# Patient Record
Sex: Female | Born: 1935 | Race: White | Hispanic: No | Smoking: Never smoker
Health system: Southern US, Community
[De-identification: ages and names within clinical notes are randomized; demographics above are authoritative.]

## PROBLEM LIST (undated history)

## (undated) DIAGNOSIS — E785 Hyperlipidemia, unspecified: Secondary | ICD-10-CM

## (undated) DIAGNOSIS — K219 Gastro-esophageal reflux disease without esophagitis: Secondary | ICD-10-CM

## (undated) DIAGNOSIS — M545 Low back pain, unspecified: Secondary | ICD-10-CM

## (undated) DIAGNOSIS — F419 Anxiety disorder, unspecified: Secondary | ICD-10-CM

## (undated) DIAGNOSIS — G43909 Migraine, unspecified, not intractable, without status migrainosus: Secondary | ICD-10-CM

## (undated) DIAGNOSIS — Z9289 Personal history of other medical treatment: Secondary | ICD-10-CM

## (undated) DIAGNOSIS — F32A Depression, unspecified: Secondary | ICD-10-CM

## (undated) DIAGNOSIS — I10 Essential (primary) hypertension: Secondary | ICD-10-CM

## (undated) DIAGNOSIS — C4490 Unspecified malignant neoplasm of skin, unspecified: Secondary | ICD-10-CM

## (undated) DIAGNOSIS — H3530 Unspecified macular degeneration: Secondary | ICD-10-CM

## (undated) DIAGNOSIS — M1990 Unspecified osteoarthritis, unspecified site: Secondary | ICD-10-CM

## (undated) DIAGNOSIS — D649 Anemia, unspecified: Secondary | ICD-10-CM

## (undated) DIAGNOSIS — E119 Type 2 diabetes mellitus without complications: Secondary | ICD-10-CM

## (undated) DIAGNOSIS — R0602 Shortness of breath: Secondary | ICD-10-CM

## (undated) DIAGNOSIS — R51 Headache: Secondary | ICD-10-CM

## (undated) DIAGNOSIS — R519 Headache, unspecified: Secondary | ICD-10-CM

## (undated) DIAGNOSIS — G8929 Other chronic pain: Secondary | ICD-10-CM

## (undated) DIAGNOSIS — F329 Major depressive disorder, single episode, unspecified: Secondary | ICD-10-CM

## (undated) HISTORY — PX: CYSTECTOMY: SUR359

## (undated) HISTORY — PX: SKIN CANCER EXCISION: SHX779

## (undated) HISTORY — PX: CATARACT EXTRACTION W/ INTRAOCULAR LENS  IMPLANT, BILATERAL: SHX1307

## (undated) HISTORY — DX: Unspecified macular degeneration: H35.30

## (undated) HISTORY — PX: UMBILICAL HERNIA REPAIR: SHX196

## (undated) HISTORY — PX: CHOLECYSTECTOMY: SHX55

## (undated) HISTORY — PX: HERNIA REPAIR: SHX51

## (undated) HISTORY — PX: ABDOMINAL HYSTERECTOMY: SHX81

---
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
## 1999-09-23 ENCOUNTER — Encounter: Payer: Self-pay | Admitting: Family Medicine

## 1999-09-23 ENCOUNTER — Encounter: Admission: RE | Admit: 1999-09-23 | Discharge: 1999-09-23 | Payer: Self-pay | Admitting: Family Medicine

## 2001-09-19 ENCOUNTER — Encounter: Payer: Self-pay | Admitting: Family Medicine

## 2001-09-19 ENCOUNTER — Encounter: Admission: RE | Admit: 2001-09-19 | Discharge: 2001-09-19 | Payer: Self-pay | Admitting: Family Medicine

## 2001-09-20 ENCOUNTER — Encounter (INDEPENDENT_AMBULATORY_CARE_PROVIDER_SITE_OTHER): Payer: Self-pay | Admitting: Specialist

## 2001-09-20 ENCOUNTER — Inpatient Hospital Stay (HOSPITAL_COMMUNITY): Admission: EM | Admit: 2001-09-20 | Discharge: 2001-09-22 | Payer: Self-pay | Admitting: Emergency Medicine

## 2002-01-28 ENCOUNTER — Inpatient Hospital Stay (HOSPITAL_COMMUNITY): Admission: RE | Admit: 2002-01-28 | Discharge: 2002-01-30 | Payer: Self-pay | Admitting: General Surgery

## 2002-03-14 ENCOUNTER — Emergency Department (HOSPITAL_COMMUNITY): Admission: EM | Admit: 2002-03-14 | Discharge: 2002-03-14 | Payer: Self-pay | Admitting: Emergency Medicine

## 2002-04-16 ENCOUNTER — Emergency Department (HOSPITAL_COMMUNITY): Admission: EM | Admit: 2002-04-16 | Discharge: 2002-04-16 | Payer: Self-pay | Admitting: Emergency Medicine

## 2002-04-16 ENCOUNTER — Encounter: Payer: Self-pay | Admitting: Emergency Medicine

## 2002-06-10 ENCOUNTER — Encounter: Payer: Self-pay | Admitting: Gastroenterology

## 2002-06-10 ENCOUNTER — Ambulatory Visit (HOSPITAL_COMMUNITY): Admission: RE | Admit: 2002-06-10 | Discharge: 2002-06-10 | Payer: Self-pay | Admitting: Gastroenterology

## 2003-02-24 ENCOUNTER — Ambulatory Visit (HOSPITAL_COMMUNITY): Admission: RE | Admit: 2003-02-24 | Discharge: 2003-02-24 | Payer: Self-pay | Admitting: Ophthalmology

## 2004-08-26 ENCOUNTER — Ambulatory Visit (HOSPITAL_COMMUNITY): Admission: RE | Admit: 2004-08-26 | Discharge: 2004-08-26 | Payer: Self-pay | Admitting: Ophthalmology

## 2006-05-10 ENCOUNTER — Observation Stay (HOSPITAL_COMMUNITY): Admission: EM | Admit: 2006-05-10 | Discharge: 2006-05-11 | Payer: Self-pay | Admitting: Emergency Medicine

## 2010-02-14 ENCOUNTER — Emergency Department (HOSPITAL_COMMUNITY): Admission: EM | Admit: 2010-02-14 | Discharge: 2010-02-14 | Payer: Self-pay | Admitting: Emergency Medicine

## 2010-08-04 LAB — BASIC METABOLIC PANEL WITH GFR
BUN: 14 mg/dL (ref 6–23)
CO2: 27 meq/L (ref 19–32)
Calcium: 9.4 mg/dL (ref 8.4–10.5)
Chloride: 100 meq/L (ref 96–112)
Creatinine, Ser: 1.02 mg/dL (ref 0.4–1.2)
GFR calc non Af Amer: 52 mL/min — ABNORMAL LOW
Glucose, Bld: 187 mg/dL — ABNORMAL HIGH (ref 70–99)
Potassium: 4 meq/L (ref 3.5–5.1)
Sodium: 136 meq/L (ref 135–145)

## 2010-09-12 ENCOUNTER — Inpatient Hospital Stay (HOSPITAL_COMMUNITY)
Admission: EM | Admit: 2010-09-12 | Discharge: 2010-09-15 | DRG: 389 | Disposition: A | Discharge status: Home / Self Care | Payer: Medicare Other | Attending: Internal Medicine | Admitting: Internal Medicine

## 2010-09-12 ENCOUNTER — Emergency Department (HOSPITAL_COMMUNITY): Payer: Medicare Other

## 2010-09-12 DIAGNOSIS — H3530 Unspecified macular degeneration: Secondary | ICD-10-CM | POA: Diagnosis present

## 2010-09-12 DIAGNOSIS — K565 Intestinal adhesions [bands], unspecified as to partial versus complete obstruction: Principal | ICD-10-CM | POA: Diagnosis present

## 2010-09-12 DIAGNOSIS — K219 Gastro-esophageal reflux disease without esophagitis: Secondary | ICD-10-CM | POA: Diagnosis present

## 2010-09-12 DIAGNOSIS — M1990 Unspecified osteoarthritis, unspecified site: Secondary | ICD-10-CM | POA: Diagnosis present

## 2010-09-12 DIAGNOSIS — N179 Acute kidney failure, unspecified: Secondary | ICD-10-CM | POA: Diagnosis present

## 2010-09-12 DIAGNOSIS — G609 Hereditary and idiopathic neuropathy, unspecified: Secondary | ICD-10-CM | POA: Diagnosis present

## 2010-09-12 DIAGNOSIS — E785 Hyperlipidemia, unspecified: Secondary | ICD-10-CM | POA: Diagnosis present

## 2010-09-12 DIAGNOSIS — Z79899 Other long term (current) drug therapy: Secondary | ICD-10-CM

## 2010-09-12 DIAGNOSIS — E871 Hypo-osmolality and hyponatremia: Secondary | ICD-10-CM | POA: Diagnosis present

## 2010-09-12 DIAGNOSIS — Z8673 Personal history of transient ischemic attack (TIA), and cerebral infarction without residual deficits: Secondary | ICD-10-CM

## 2010-09-12 DIAGNOSIS — E7800 Pure hypercholesterolemia, unspecified: Secondary | ICD-10-CM | POA: Diagnosis present

## 2010-09-12 DIAGNOSIS — F3289 Other specified depressive episodes: Secondary | ICD-10-CM | POA: Diagnosis present

## 2010-09-12 DIAGNOSIS — Z7982 Long term (current) use of aspirin: Secondary | ICD-10-CM

## 2010-09-12 DIAGNOSIS — E119 Type 2 diabetes mellitus without complications: Secondary | ICD-10-CM | POA: Diagnosis present

## 2010-09-12 DIAGNOSIS — F329 Major depressive disorder, single episode, unspecified: Secondary | ICD-10-CM | POA: Diagnosis present

## 2010-09-12 DIAGNOSIS — I10 Essential (primary) hypertension: Secondary | ICD-10-CM | POA: Diagnosis present

## 2010-09-12 LAB — CBC
HCT: 38.2 % (ref 36.0–46.0)
Hemoglobin: 13.6 g/dL (ref 12.0–15.0)
MCH: 30.7 pg (ref 26.0–34.0)
MCV: 86.2 fL (ref 78.0–100.0)
Platelets: 307 10*3/uL (ref 150–400)
RBC: 4.43 MIL/uL (ref 3.87–5.11)
WBC: 10 10*3/uL (ref 4.0–10.5)

## 2010-09-12 LAB — DIFFERENTIAL
Lymphocytes Relative: 17 % (ref 12–46)
Lymphs Abs: 1.7 10*3/uL (ref 0.7–4.0)
Monocytes Relative: 18 % — ABNORMAL HIGH (ref 3–12)
Neutro Abs: 6.5 10*3/uL (ref 1.7–7.7)
Neutrophils Relative %: 65 % (ref 43–77)

## 2010-09-12 LAB — BASIC METABOLIC PANEL
BUN: 45 mg/dL — ABNORMAL HIGH (ref 6–23)
CO2: 24 mEq/L (ref 19–32)
Chloride: 88 mEq/L — ABNORMAL LOW (ref 96–112)
Glucose, Bld: 239 mg/dL — ABNORMAL HIGH (ref 70–99)
Potassium: 4.8 mEq/L (ref 3.5–5.1)

## 2010-09-13 ENCOUNTER — Inpatient Hospital Stay (HOSPITAL_COMMUNITY): Payer: Medicare Other

## 2010-09-13 LAB — CBC
HCT: 32.4 % — ABNORMAL LOW (ref 36.0–46.0)
Hemoglobin: 11.2 g/dL — ABNORMAL LOW (ref 12.0–15.0)
MCH: 29.9 pg (ref 26.0–34.0)
RBC: 3.75 MIL/uL — ABNORMAL LOW (ref 3.87–5.11)

## 2010-09-13 LAB — COMPREHENSIVE METABOLIC PANEL
ALT: 16 U/L (ref 0–35)
AST: 19 U/L (ref 0–37)
CO2: 25 mEq/L (ref 19–32)
Chloride: 102 mEq/L (ref 96–112)
Creatinine, Ser: 1.25 mg/dL — ABNORMAL HIGH (ref 0.4–1.2)
GFR calc Af Amer: 50 mL/min — ABNORMAL LOW (ref >60)
GFR calc non Af Amer: 41 mL/min — ABNORMAL LOW (ref >60)
Glucose, Bld: 151 mg/dL — ABNORMAL HIGH (ref 70–99)
Sodium: 135 mEq/L (ref 135–145)
Total Bilirubin: 0.9 mg/dL (ref 0.3–1.2)

## 2010-09-13 LAB — GLUCOSE, CAPILLARY
Glucose-Capillary: 149 mg/dL — ABNORMAL HIGH (ref 70–99)
Glucose-Capillary: 105 mg/dL — ABNORMAL HIGH (ref 70–99)

## 2010-09-14 ENCOUNTER — Inpatient Hospital Stay (HOSPITAL_COMMUNITY): Payer: Medicare Other

## 2010-09-14 LAB — GLUCOSE, CAPILLARY
Glucose-Capillary: 128 mg/dL — ABNORMAL HIGH (ref 70–99)
Glucose-Capillary: 88 mg/dL (ref 70–99)
Glucose-Capillary: 89 mg/dL (ref 70–99)

## 2010-09-14 LAB — CBC
Platelets: 220 10*3/uL (ref 150–400)
RBC: 3.51 MIL/uL — ABNORMAL LOW (ref 3.87–5.11)
WBC: 6.6 10*3/uL (ref 4.0–10.5)

## 2010-09-14 LAB — HEMOGLOBIN A1C: Hgb A1c MFr Bld: 7 % — ABNORMAL HIGH (ref <5.7)

## 2010-09-14 LAB — BASIC METABOLIC PANEL
Chloride: 109 mEq/L (ref 96–112)
GFR calc Af Amer: >60 mL/min (ref >60)
Potassium: 3.7 mEq/L (ref 3.5–5.1)
Sodium: 143 mEq/L (ref 135–145)

## 2010-09-14 NOTE — H&P (Signed)
NAME:  Tamara Arnold, Tamara Arnold NO.:  [id number]  MEDICAL RECORD NO.:  [id number]           PATIENT TYPE:  E  LOCATION:  [facility]                         FACILITY:  [facility]  PHYSICIAN:  Alphia Moh, MD   DATE OF BIRTH:  02/23/1926  DATE OF ADMISSION:  09/12/2010 DATE OF DISCHARGE:                             HISTORY & PHYSICAL   PRIMARY CARE PHYSICIAN:  [name], MD at [facility] at [place].  CHIEF COMPLAINT:  Vomiting.  HISTORY OF PRESENT ILLNESS:  Tamara Arnold is an 77 year old woman with past medical history significant for his appendicitis as a child with appendectomy, cholecystectomy, hernia repair, and hysterectomy who presents with vomiting times approximately 4 days.  The patient states that on Friday prior to admission, she developed some vomiting.  She describes this is not associated with any other symptoms aside from throwing up any ingested products.  Apparently this became persistently worse over time.  She reports that anything that she tried to eat or drink would come up within a short period of time after doing so. Husband states that she threw up all of last night.  Today she went to visit her primary care physician who got an abdominal x-ray and recommended that the patient be evaluated in the emergency department for a suspected partial small-bowel obstruction.  Upon evaluation in the emergency department, acute abdominal series demonstrates findings consistent with a small-bowel obstruction.  Dr. Christy Gentles, EDP, spoke and discussed the case with Dr. Georganna Skeans (surgery) and recommended a medical admission.  At that point, [facility] were called for the admission.  PAST MEDICAL HISTORY: 1. History of Bell's palsy. 2. Hypertension. 3. Diabetes. 4. GERD. 5. Hyperlipidemia. 6. Lower back pain. 7. Macular degeneration. 8. Osteoarthritis. 9. Peripheral neuropathy. 10.History of TIA.  PAST SURGICAL  HISTORY: 1. Appendectomy. 2. Hernia repair. 3. Hysterectomy. 4. Cholecystectomy.  MEDICATIONS: 1. Omega-3 1000 mg p.o. b.i.d. 2. Multivitamin 1 tablet p.o. daily. 3. Vitamin C. 4. Calcium plus vitamin D. 5. Triamcinolone acetate applied to skin twice daily to affected     areas. 6. Crestor 20 mg half a tablet p.o. daily. 7. Folic acid 1 mg 2 tablets p.o. daily. 8. Perphenazine 4 mg p.o. at bedtime. 9. Nexium 40 mg p.o. daily. 10.Amitriptyline 25 mg p.o. at bedtime. 11.Moexipril 7.5 mg 2 tablets p.o. daily. 12.Propranolol 40 mg p.o. b.i.d. 13.Meloxicam 15 mg p.o. daily. 14.Glimepiride 2 mg p.o. daily. 15.Aspirin 81 mg p.o. daily. 16.Wellbutrin XL 300 mg p.o. daily. 17.Clonazepam 1 mg p.o. b.i.d. p.r.n. 18.Fenofibrate 160 mg p.o. daily.  ALLERGIES:  No known drug allergies.  SOCIAL HISTORY:  The patient is married and lives with her husband. Husband's name is Tenisa Fleetwood.  Home phone number is [phone].  The patient denies any tobacco, alcohol, or illicit drug use.  FAMILY HISTORY:  Noncontributory.  REVIEW OF SYSTEMS:  As per HPI.  All others reviewed and negative.  PHYSICAL EXAMINATION:  VITAL SIGNS:  Temperature 97.0, blood pressure 135/61, heart rate of 91, respirations 18, O2 saturation 97% on room air. GENERAL:  This is an elderly lady lying in bed in no acute distress.  HEENT:  Head is normocephalic, atraumatic.  Pupils equally round and reactive to light.  Extraocular movements are intact.  Sclerae are anicteric.  Mucous membranes are very dry.  There is an NG tube in place with gastric contents being suctioned that are dark yellow in color. There is poor dentition.  No oropharyngeal lesions. NECK:  Supple.  There is no JVD.  No carotid bruits. LUNGS:  Clear to auscultation with good air movement bilaterally. HEART:  Normal S1, S2 with a regular rate and rhythm.  No murmurs, gallops, or rubs. ABDOMEN:  Grossly distended on inspection.  There is a paucity of  bowel sounds though present.  Overall nontender to palpation and tympanic to percussion. EXTREMITIES:  There is no cyanosis, clubbing, or edema. NEURO:  Cranial nerves II-XII are grossly intact.  Motor is intact. Sensation is intact.  The patient is awake, alert, and oriented x3.  LABORATORY DATA:  WBC 10.0, hemoglobin 13.6, platelets 307,000.  Sodium 123, potassium 4.8, chloride 88, bicarb 24, glucose 239, BUN 45, creatinine 1.45, calcium 8.8.  IMAGING:  Acute abdominal series.  IMPRESSION:  Findings consistent with a small-bowel obstruction.  ASSESSMENT/PLAN: 1. Small-bowel obstruction as mentioned in HPI, EDP discussed the case     with Dr. Georganna Skeans from [facility] and agreed     that the patient is not a surgical candidate at this time.  The     patient will be admitted to regular floor for conservative/medical     management.  NG tube is currently placed for symptoms.  We will     keep the patient n.p.o. and monitor over the next few days.  If     there is no improvement with spontaneous resolution, will need a     formal surgical consultation to further evaluate.  Given her     history, this is most likely secondary to adhesions from her     multiple abdominal surgeries.  If the patient does not improve, we     will need to pursue further imaging such as a CT scan to further     evaluate the source of obstruction.  In the meantime, can evaluate     with acute abdominal series to see if small bowel dilation is     improving. 2. Hyponatremia.  Given the patient's history and physical exam     findings, this is most likely secondary to volume depletion.  We     will go ahead and hydrate with normal saline and follow metabolic     panel.  If in the case that this does not resolve appropriately, we     will need to consider checking a TSH plus or minus a cortisol level     though this is most likely secondary to volume depletion. 3. Acute renal failure.  The  patient's creatinine is 1.45 with a BUN     of 45 and an elevated BUN to creatinine ratio.  As per the above     problems, this is likely secondary following depletion.  We will     hold the patient's ACE inhibitor and hydrate with normal saline as     per above.  Monitor metabolic panel once creatinine is down to     normal range, can consider restarting the above medications. 4. Hypertension.  As mentioned, we continue to hold the medications     tonight as the patient is n.p.o. with NG tube intermittent suction     due  to persistent vomiting.  Once this has improved, can restart     medications with temporary clamping of the NG tube. 5. Depression.  The patient is on a few psychiatric medications.  As     mentioned above, we will go ahead and hold these tonight and     consider restarting tomorrow with temporary clamping of the NG     tube. 6. Hyperlipidemia as above.  As above, we will hold medications and     consider starting tomorrow with temporary clamping of the NG tube. 7. Diabetes.  We will hold glimepiride along with all the other     medicines as mentioned.  We will cover with sliding scale insulin     while in-house. 8. The patient is a full code.  Husband, Tabatha Santalucia with home     phone number [phone] is the person to contact in case of any     emergency.     Alphia Moh, MD     MA/MEDQ  D:  09/12/2010  T:  09/12/2010  Job:  [id number]  cc:   [name], M.D. Merri Ray Grandville Silos, M.D. Ripley Fraise, MD  Electronically Signed by Alphia Moh MD on 09/14/2010 02:46:35 PM

## 2010-09-15 LAB — GLUCOSE, CAPILLARY
Glucose-Capillary: 93 mg/dL (ref 70–99)
Glucose-Capillary: 81 mg/dL (ref 70–99)

## 2010-09-19 NOTE — Consult Note (Signed)
Tamara, Arnold               ACCOUNT NO.:  [id number]  MEDICAL RECORD NO.:  [id number]           PATIENT TYPE:  I  LOCATION:  5035                         FACILITY:  [facility]  PHYSICIAN:  Merri Ray. Grandville Silos, M.D.DATE OF BIRTH:  06/25/1925  DATE OF CONSULTATION:  09/12/2010 DATE OF DISCHARGE:                                CONSULTATION   CHIEF COMPLAINT:  Nausea, vomiting.  HISTORY OF PRESENT ILLNESS:  Tamara Arnold is a very pleasant 77 year old white female who complains of nausea and vomiting for the past 4 days. She has not had any significant abdominal pain, only mild at times.  She was seen by her primary physician and directed to the emergency department for further evaluation.  Her last bowel movement was 2 days ago.  PAST MEDICAL HISTORY: 1. Diabetes. 2. Hypercholesterolemia. 3. Hypertension.  She does not have a history of previous bowel     obstruction.  PAST SURGICAL HISTORY: 1. Cholecystectomy. 2. Incisional hernia repair. 3. Hysterectomy. 4. Appendectomy.  SOCIAL HISTORY:  Does not smoke, does not drink alcohol, does not use drugs.  REVIEW OF SYSTEMS:  GI complaints as above.  In addition, CARDIAC: Negative.  PULMONARY:  Negative.  GU:  Negative.  MUSCULOSKELETAL: Negative.  Review of systems is otherwise normal.  MEDICATIONS:  Please see MAR.  ALLERGIES:  PREDNISONE.  PHYSICAL EXAMINATION:  VITAL SIGNS:  Temperature 97.5, heart rate 87, blood pressure 133/76, respirations 16, saturations 96%. HEENT:  Head is normocephalic.  Nose has NG tube in place.  Mouth, oral mucosa is dry. NECK:  Trachea is in the midline.  There are no masses or tenderness. LUNGS:  Respiratory effort is normal.  Lungs are clear to auscultation bilaterally. HEART:  Normal S1 and S2.  There is no peripheral edema.  PMI is palpable in the left chest. ABDOMEN:  Soft, but distended.  She has some high-pitched bowel sounds and some normal bowel sounds.  She has mild right lower  quadrant tenderness.  No masses are felt.  There is no guarding and no generalized peritonitis. MUSCULOSKELETAL:  Well developed without tenderness. SKIN:  Dry and warm. NEUROLOGIC:  No gross deficit.  Speech is fluent and she follows commands.  LABORATORY STUDIES:  Sodium 123, potassium 4.8, chloride 88, CO2 of 24, BUN 45, creatinine 1.45, glucose 239.  White blood cell count 10,000, hemoglobin 13.6.  DIAGNOSTICS:  Abdominal series shows small bowel obstruction, however, air and stool is present in the colon.  There is no free air noted.  IMPRESSION:  Small bowel obstruction, likely secondary to adhesions from previous surgery.  PLAN:  I agree with medical admission and NG tube placement with bowel rest and IV fluids.  We will evaluate the patient clinically tomorrow and possibly proceed with CT scan of the abdomen and pelvis if she does not improve clinically.  I also discussed possibility of needing the surgery for exploration and possible lysis of adhesions if her bowel obstruction does not resolve with the above therapies.  Other questions were answered.     Merri Ray Grandville Silos, M.D.     BET/MEDQ  D:  09/12/2010  T:  09/13/2010  Job:  [id number]  cc:   [name], M.D. Ripley Fraise, MD  Electronically Signed by Georganna Skeans M.D. on 09/19/2010 01:56:36 PM

## 2010-09-22 NOTE — Discharge Summary (Signed)
Tamara Arnold, Tamara Arnold NO.:  [id number]  MEDICAL RECORD NO.:  [id number]           PATIENT TYPE:  I  LOCATION:  [id number]                         FACILITY:  [facility]  PHYSICIAN:  Tamara Binet, MD    DATE OF BIRTH:  Oct 14, 1925  DATE OF ADMISSION:  09/12/2010 DATE OF DISCHARGE:  09/15/2010                              DISCHARGE SUMMARY   PRIMARY CARE PHYSICIAN:  Tamara Craver, MD  DISCHARGE DIAGNOSES: 1. Active diagnosis: partial small-bowel obstruction.  The rest of     the discharge diagnoses are chronic.  The patient has had them for     a prolonged period of time and include: 2. Acute renal failure resolved. 3. Hypertension. 4. Diabetes mellitus. 5. Gastroesophageal reflux disease. 6. Hyperlipidemia. 7. Lower back pain. 8. Macular degeneration. 9. Osteoarthritis. 10.Peripheral neuropathy. 11.History of transient ischemic attack. 12.Chronic headaches. 13.Emotional illness.  DISCHARGE MEDICATIONS: 1. Zofran 4 mg q. 6 hours as needed for nausea.  The patient will     receive a prescription for 20 tablets with no refills. 2. Aspirin 81 mg 1 tablet daily. 3. Clonazepam 1 mg 1 tablet twice daily as needed for stress. 4. Crestor half tablet every Monday, Wednesday, Friday by mouth. 5. Folic acid 1 mg 2 tablets by mouth daily. 6. Glimepiride 2 mg 1 tablet by mouth daily. 7. Meloxicam 15 mg 1 tablet by mouth daily. 8. Moexipril 7.5 mg 1 tablet by mouth daily. 9. Multivitamin OTC. 10.Nexium 40 mg 1 tablet by mouth daily. 11.Omega-3-acid ethyl esters 1 gram by mouth twice daily. 12.Perphenazine 4 mg 1 tablet daily at bedtime. 13.Propranolol 40 mg 1 tablet by mouth twice daily. 14.TriCor 150 mg 1 tablet by mouth daily. 15.Vitamin C 1000 mg by mouth daily. 16.Wellbutrin 1 tablet by mouth daily.  The patient will stop taking the following medications. 1. Amitriptyline 25 mg 1 tablet by mouth daily. 2. Calcium carbonate 600 mg 1 tablet by mouth daily.  HISTORY  OF PRESENT ILLNESS AND BRIEF HOSPITAL COURSE:  Tamara Arnold is an 77 year old female with past medical history significant for appendicitis, appendectomy, cholecystectomy, hernia repair and hysterectomy who presented in the emergency department with vomiting for 4 days.  She did not have any hematemesis.  She did have a previous history of bowel obstruction.  X-ray in the emergency department revealed suspected partial small-bowel obstruction and constipation. The patient was admitted, an NG tube was placed, Surgery consult was called.  Surgery saw the patient on September 12, 2010.  They agreed with admission, NG tube placement, bowel rest and IV fluids.  They recommended a CT scan if the patient did not improve quickly clinically and considered the necessity of surgery for exploration and possible lysis of adhesions, however, fortunately with bowel rest and NG suction on September 13, 2010, she was already feeling somewhat improved and her abdominal x-ray appeared improved.  On September 14, 2010, the patient began taking sips of clear liquids without any nausea or vomiting.  Today, September 15, 2010, the patient appears well.  She is sitting up, moving around, taking clear liquids without any difficulty and desires discharge to home.  Next, diagnosis with regards to her mild acute renal failure on admission the patient had been vomiting for 4 days and was noted to have a BUN of 45, creatinine 1.45.  Her renal failure could have been simply the result of dehydration.  Her BUN and creatinine improved nicely and on September 14, 2010, her creatinine was 0.94, BUN 24.  CONSULTATIONS:  [facility] consulted on this patient on September 12, 2010.  She was seen by Tamara Arnold.  IMAGING:  On admission September 12, 2010, the patient had two-view abdominal x-ray that showed findings consistent with small bowel obstruction.  On September 14, 2010, the patient's follow-up abdominal x-ray showed a  nonobstructive bowel gas pattern and decreased stool in her colon.  PERTINENT LABORATORY DATA:  On admission, the patient had a CBC within normal limits.  Her white count was 10.0, hemoglobin 13.6, hematocrit 38.2, platelets 307.  Sodium 123, potassium 4.8, chloride 88, bicarb 24, glucose 239, BUN 45, creatinine 1.45.  Her hemoglobin A1c this admission was found to be 7.0 on September 14, 2010.  Also on September 14, 2010, her CBC was as follows: White count 6.6, hemoglobin 10.4, we believe the drop to be delusional from IV fluids, hematocrit 30.7.  Sodium is now 143, potassium 3.7, chloride 109, bicarb 24, glucose 84, BUN 24, creatinine 0.94.  PHYSICAL EXAMINATION ON DISCHARGE:  GENERAL:  This morning, the patient is alert and oriented, up standing and bathing herself. VITAL SIGNS:  Temperature 99.6, pulse 104, respirations 20, blood pressure 140/82, oxygen sat is 97% on room air. HEENT:  Head is atraumatic, normocephalic.  Eyes are slightly hyperemic but pupils are equal, round and reactive to light.  Nose shows no nasal discharge or exterior lesions.  Mouth has moist mucous membranes and moderate dentition. NECK:  Supple with midline trachea.  No JVD.  No lymphadenopathy. CHEST:  Demonstrates no accessory muscle use, shows no wheezes or crackles to my auscultation. HEART:  Slightly tachycardic without murmurs, rubs or gallops. ABDOMEN:  Slightly distended with bowel sounds and is nontender to palpation. EXTREMITIES:  No clubbing, cyanosis or edema. SKIN:  No rashes, bruises or lesions. NEURO:  Cranial nerves II-XII are grossly intact.  She has no facial asymmetries, no obvious focal neuro deficits. PSYCHIATRIC:  The patient is alert and oriented, demeanor pleasant, cooperative.  Grooming is excellent.  DISCHARGE INSTRUCTIONS:  The patient is to increase her activity slowly. She will be discharged with a rolling walker for stability.  Diet:  She will start with clear liquids and slowly  advance if there is no nausea or vomiting or pain to full - thicker liquids, then eventually to soft solid.  The patient has been educated that if nausea or vomiting develops prior to seeing Tamara Arnold, she is to either call Tamara Arnold or return to the emergency department.  She has also been instructed that if she has no bowel movement for 2 days, she is to take two Colace pills twice a day until she has a bowel movement to avoid constipation.  FOLLOWUP APPOINTMENTS:  The patient is to see Dr. Juanita Arnold within the next 2 weeks.  THINGS TO FOLLOW UP ON AS AN OUTPATIENT:  After IV fluids, the patient's hemoglobin dropped to approximately 10.  Consequently, she is anemic and likely appropriate for outpatient workup if this has not already been undertaken.     Melton Alar, PA   ______________________________ Tamara Binet, MD    MLY/MEDQ  D:  09/15/2010  T:  09/15/2010  Job:  [id number]  cc:   Tamara Arnold, M.D.  Electronically Signed by Imogene Burn PA on 09/20/2010 12:52:47 PM Electronically Signed by Tamara Arnold  on 09/22/2010 03:26:56 PM

## 2010-10-07 NOTE — Op Note (Signed)
[facility]  Patient:    Tamara Arnold, Tamara Arnold Visit Number: [id number] MRN: [id number]          Service Type: SUR Location: 3W [id number] Attending Physician:  Tiffany Kocher Dictated by:   Darene Lamer Hoxworth, M.D. Proc. Date: 09/20/01 Admit Date:  09/20/2001                             Operative Report  PREOPERATIVE DIAGNOSES:  Cholelithiasis and cholecystitis.  POSTOPERATIVE DIAGNOSES:  Cholelithiasis and cholecystitis.  SURGICAL PROCEDURE:  Laparoscopic cholecystectomy.  SURGEON:  Darene Lamer. Hoxworth, M.D.  ASSISTANT:  Shellia Carwin, M.D.  ANESTHESIA:  General.  BRIEF HISTORY:  Ms.  Arnold is a 77 year old white female who presents with four days of persistent and worsening epigastric abdominal pain associated with nausea. She has had a gallbladder ultrasound yesterday that showed a gallbladder packed with gallstones and normal common bile duct. She presented to the emergency room today with increasing pain and epigastric tenderness noted on exam. Laparoscopic cholecystectomy has been recommended and accepted. The nature of the procedure, its indications, risks of bleeding, infection, bile leak, and bile duct injury were discussed and understood preoperatively. She is now brought to the operating room for this procedure.  DESCRIPTION OF PROCEDURE:  The patient was brought to the operating room and placed in supine position on the operating table and general endotracheal anesthesia was induced. PAS were in place. She was given preoperative antibiotics. The entire abdomen was sterilely prepped and draped. The patient had a previous low midline incision and a large ventral incisional hernia in the lower right quadrant. I elected to obtain a pneumoperitoneum with a Veress needle in the right upper quadrant and this was obtained and using an Optiview 5 mm trocar and scope, the abdomen was entered under direct vision through a 5 mm trocar  in the right upper quadrant. Under direct vision, another 5 mm trocar was placed in the right upper quadrant and a 10/12 mm trocar just to the right of the falciform ligament in the epigastrium. Some midline adhesions were then sharply taken down from the midline. These were omental adhesions only. This allowed placement of another 10/12 mm trocar in the mid abdomen. The patients umbilicus had been surgically removed previously. The gallbladder was visualized. It was literally packed with gallstones. It did not appear, however, acutely inflamed. The fundus was grasped and elevated up over the liver and the infundibulum retracted inferolaterally. Fibrofatty tissue was stripped from the neck of the gallbladder toward the porta hepatis. The distal gallbladder and Calots triangle were thoroughly dissected. The cystic artery was identified coursing up onto the gallbladder wall. The cystic duct was identified and dissected free over about 2 cm and the cystic duct gallbladder junction dissected 360 degrees. When the anatomy was clear, the cystic duct and cystic artery were doubly clipped proximally, clipped distally an divided. The gallbladder was then dissected free from its bed using hook and cautery. Complete hemostasis was assured after detaching the gallbladder. The right upper quadrant was irrigated and suctioned until clear. The gallbladder was placed in an endocatch bag and then was brought up to the epigastric port site and multiple gallstones removed and the gallbladder removed. The operative site was again inspected for hemostasis and trocars removed and CO2 evacuated from the peritoneal cavity. The fascial defect in the epigastrium which had been stretched slightly removing the gallbladder was closed with a figure-of-eight suture of #  0 Vicryl. The skin incisions were closed with interrupted subcuticular 4-0 monocryl and Steri-Strips. Sponge, needle and instrument counts were correct.  Dry sterile dressings were applied and the patient taken to the recovery room in good condition. Dictated by:   Darene Lamer. Hoxworth, M.D. Attending Physician:  Tiffany Kocher DD:  09/20/01 TD:  09/22/01 Job: [id number]

## 2010-10-07 NOTE — Op Note (Signed)
NAME:  Tamara Arnold, Tamara Arnold NO.:  [id number]   MEDICAL RECORD NO.:  [id number]          PATIENT TYPE:  OIB   LOCATION:                               FACILITY:  [facility]   PHYSICIAN:  Garey Ham, M.D.DATE OF BIRTH:  [birth year]   DATE OF PROCEDURE:  08/26/2004  DATE OF DISCHARGE:                                 OPERATIVE REPORT   PREOPERATIVE DIAGNOSIS:  Senile nuclear cataract, left eye.   POSTOPERATIVE DIAGNOSIS:  Senile nuclear cataract, left eye.   NAME OF OPERATION:  Planned extracapsular cataract extraction --  phacoemulsification, primary insertion of posterior chamber intraocular lens  implant.   SURGEON:  Garey Ham, M.D.   ASSISTANT:  Nurse.   ANESTHESIA:  Local 4% Xylocaine, 0.75 Marcaine, retrobulbar block with  Wydase added, topical tetracaine, intraocular Xylocaine.  Anesthesia standby  required in this patient. Patient given sodium Pentothal intravenously  during the period of retrobulbar blocking.   OPERATIVE PROCEDURE:  After the patient was prepped and draped, a lid  speculum was inserted in the left eye. The eye was turned downward and a  superior rectus suture placed after a speculum had been inserted.  A  peritomy was performed adjacent to the limbus from the 11 to 1 o'clock  position. The corneoscleral junction was cleaned and a corneoscleral groove  made a 45 degree Superblade. The anterior chamber was then entered with the  2.5 mm diamond keratome at the 12 o'clock position; and the 15-degree blade  at the 2:30 position.   Using a bent 26-gauge needle on an Occucoat syringe a circular  capsulorrhexis was begun and then completed with the Utrata forceps.  Hydrodissection and hydrodelineation were performed using 1% Xylocaine. The  30-degree phacoemulsification tip was then inserted with slow controlled  emulsification of the lens nucleus.  Total ultrasonic time 1 minute 31  seconds, average power level 19%, total amount of fluid  used 90 mL  Following removal of the nucleus. The residual cortex was aspirated with the  irrigation-aspiration tip. The posterior capsule appeared intact with a  brilliant red fundus reflex.  It was, therefore, elected to insert an  Allergan medical optics, SI 40 MB silicone, 3-piece, posterior chamber  intraocular lens implant, diopter strength +20.00.   This was inserted with the McDonald forceps into the anterior chamber and  then centered in through the capsular bag using the [facility] lens rotator. The  lens appeared to be well-centered. The Occucoat and Provisc, which had been  used intermittently during the procedures, were aspirated and replaced with  balanced salt solution and Miochol ophthalmic solution. The operative  incisions appeared to be self-sealing. It was elected, however, to place a  single 10-0 interrupted nylon suture across the 12 o'clock incision to  ensure closure and to prevent endophthalmitis.  Maxitrol ointment was instilled in the conjunctival cul-de-sac and a light  patch and protective shield applied. Duration of procedure and anesthesia  administration 45 minutes. The patient tolerated the procedure well in  general, left the operating room for the recovery room in good condition.  ___________________________________________  Garey Ham, M.D.    HNJ/MEDQ  D:  08/26/2004  T:  08/26/2004  Job:  [id number]

## 2010-10-07 NOTE — H&P (Signed)
NAMEMarland Arnold  CHIZUKO, CHINEN NO.:  [id number]   MEDICAL RECORD NO.:  [id number]          PATIENT TYPE:  OIB   LOCATION:  2899                         FACILITY:  [facility]   PHYSICIAN:  Garey Ham, M.D.DATE OF BIRTH:  November 12, 1925   DATE OF ADMISSION:  08/26/2004  DATE OF DISCHARGE:                                HISTORY & PHYSICAL   CHIEF COMPLAINT:  This was a planned outpatient readmission of this 77-year-  old white female admitted for cataract implant surgery of the left eye.   PRESENT ILLNESS:  This patient has been noted to have progressive cataract  formation in both eyes. She was previously admitted on October 5, [id number], for  uncomplicated cataract implant surgery of the right eye. The patient did  well following this surgery with return of vision to 20/20. The left  unoperated has recently deteriorated to 20/60 -2 and the patient has elected  to proceed with similar cataract implant surgery. She was given oral  discussion and printed information concerning the procedure and its possible  complications. She signed an informed consent and arrangements were made for  her outpatient admission at this time.   PAST MEDICAL HISTORY:  The patient is in stable general health under the  care of Tamara Arnold, M.D. The patient has non insulin-dependent diabetes  mellitus with good control of her blood sugar.   CURRENT MEDICATIONS:  Include Tri-Chlor, Amaryl, Univasc, Wellbutrin,  propranolol, and Clonidine. She is felt to be in satisfactory condition for  the proposed surgery by Dr. Tollie Pizza.   REVIEW OF SYSTEMS:  No cardiorespiratory complaints.   PHYSICAL EXAMINATION:  VITAL SIGNS: As recorded on admission blood pressure  151/78, pulse 81, respirations 16, temperature 96.8.  GENERAL APPEARANCE: The patient is a pleasant well-nourished, well-developed  77 year old white female in no acute distress.  HEENT: EYES: Visual acuity last recorded at 20/40 right eye, 20/70 -2  left  eye. External ocular and slit lamp examination of the eyes are white and  clear with a nuclear cataract noted in the left eye and a clear posterior  chamber implant in the right eye. Dilated fundus examination of both eyes  reveals scattered multiple macular retinal drusen.  CHEST/LUNGS: Clear to percussion and auscultation.  HEART: Normal sinus rhythm, no cardiomegaly. No murmurs.  ABDOMEN: Negative.  EXTREMITIES: Negative.   ADMISSION DIAGNOSIS:  Senile cataract left eye, pseudophakia right eye.   SURGICAL PLAN:  Cataract implant surgery left eye.      HNJ/MEDQ  D:  08/26/2004  T:  08/26/2004  Job:  [id number]

## 2010-10-07 NOTE — H&P (Signed)
NAME:  Tamara Arnold, Tamara Arnold NO.:  [id number]   MEDICAL RECORD NO.:  [id number]          PATIENT TYPE:  EMS   LOCATION:  MAJO                         FACILITY:  [facility]   PHYSICIAN:  Jacquelynn Cree, M.D.   DATE OF BIRTH:  Sep 03, 1925   DATE OF ADMISSION:  05/09/2006  DATE OF DISCHARGE:                              HISTORY & PHYSICAL   PRIMARY CARE PHYSICIAN:  [name], M.D. of [facility].   CHIEF COMPLAINT:  Heart palpitations.   HISTORY OF PRESENT ILLNESS:  The patient is a 77 year old female who,  while watching television tonight, experienced the sudden onset of a  vague feeling of discomfort followed by chest palpitations.  The patient  denies any history of similar events.  The patient states that she has  been treated for headaches and hypertension with propranolol, and her  physician recently increased her propranolol dose to 40 mg t.i.d.  When  she went to get a refill on this medication, the pharmacy has run out,  and she, therefore, did not have any propranolol for the past 24 hours.  The patient called EMS and upon initial arrival was found to be in  supraventricular tachycardia with rates in the 160s.  She received  adenosine in the field, and this brought her heart rate down into the  low 100s.  Upon arrival in the emergency department, she was given an IV  dose of metoprolol, and her heart rate has steadily come down and is now  currently running in the 90s.  She was also found to be fairly  hypertensive upon arrival in the ER, and is, therefore, being admitted  for further evaluation and observation.  The patient denies any chest  pain or shortness of breath.   PAST MEDICAL HISTORY:  1. Diabetes.  2. Hypertension.  3. Hypercholesterolemia.  4. Chronic headaches.  5. Osteoarthritis.   PAST SURGICAL HISTORY:  1. Hysterectomy.  2. Status post hernia repair.  3. Status post removal of a benign skin growth on her scalp with  skin      grafting.  4. Cataract surgery bilaterally.  5. Status post laparoscopic cholecystectomy.  6. Status post appendectomy.  7. Status post colonoscopy and upper endoscopy in Januaryof 2004      for workup of iron deficiency anemia.   FAMILY HISTORY:  The patient's mother died at 60, presumably from heart  disease.  Father died in his [id number] from complications of diabetes.  The  patient has two brothers who are deceased; one died of skin cancer at  age 75.  He also had heart disease.  The other brother died suddenly at  age 4 from an acute MI.   SOCIAL HISTORY:  The patient is married.  She is retired from office  work.  She is a lifelong nonsmoker.  She does not drink alcohol.  She  has 4 children good health.   ALLERGIES:  STEROIDS.   CURRENT MEDICATIONS:  1. Propranolol 40 mg t.i.d.  2. Amaryl 1 mg daily.  3. Crestor 5 mg daily.  4. Klonopin 1 mg b.i.d.  5. Multivitamin daily.  6. Meloxicam 15 mg daily.  7. Perphenazine/amitriptyline 2/25 one tablet b.i.d.  8. Tricor 200 mg daily.  9. Univasc 7.5 mg 1/2 tablet daily.  10.Wellbutrin XL 300 mg daily.   REVIEW OF SYSTEMS:  The patient denies any fever.  Her appetite has been  normal.  She denies any cough.  She has osteoarthritic type pain in her  hips and legs, nothing new.  No changes in her bowel habits.  No melena  or hematochezia.  No nausea or vomiting.  No dysuria.  She does note  some abdominal bloating over the past 24 hours and some mild pedal  edema.   PHYSICAL EXAMINATION:  VITAL SIGNS:  Temperature 97.8, pulse 96,  respirations 20, blood pressure 186/76, O2 saturation 100% on room air.  GENERAL:  This is a well-developed, well-nourished female who is in no  acute distress.  HEENT:  Normocephalic, atraumatic.  Pupils are equal, round and reactive  to light.  Extraocular movements intact.  Oropharynx is clear.  She has  dentures to the upper palate.  NECK:  Supple, no thyromegaly, no lymphadenopathy, no  jugular venous  distension.  CHEST:  Lungs clear to auscultation bilaterally with good air movement.  No rales.  HEART:  Regular rate and rhythm.  No murmurs, rubs or gallops.  ABDOMEN:  Soft, nontender, nondistended with normoactive bowel sounds.  EXTREMITIES:  There is trace pedal edema with 2+ pulses bilaterally.  SKIN:  The patient does have some alopecia to the scalp.  Otherwise,  appeared normal.  No rashes.  NEUROLOGIC:  The patient is alert and oriented x3.  Cranial nerves II-  XII grossly intact.  Moves all extremities x4 with equal strength.   DATA REVIEWED:  Chest x-ray shows no active disease.  It is stable when  compared to prior chest x-rays.   A 12-lead EKG shows normal sinus rhythm with a rate of 88 beats per  minute.  There are no ST or T-wave changes appreciated.   LABORATORY DATA:  White blood cell count of 6.4, hemoglobin 12.3,  hematocrit 35.6, platelets 274.  Sodium is 132, potassium 3.7, chloride  98, bicarbonate 25, BUN 12, creatinine 0.9, glucose 131.  LFTs are  within normal limits.  Point of care cardiac markers in the emergency  department show normal CK and CK-MB fractions.  The troponin has  gradually risen x3 sets with the first one being less than 0.05, 0.06,  and 0.09.   ASSESSMENT/PLAN:  1. Self-limited supraventricular tachycardia likely secondary to      abrupt withdrawal of propranolol.  Will admit the patient for 23-      hour observation to a telemetry bed and monitor her closely for      signs of recurrence.  We will resume her home dose of propranolol.      Given the stress of this event, we will cycle cardiac enzymes q.8h.      x3.  The mild increase in her troponin is likely reflective of the      tachycardia.  The patient is completely without symptoms at this      time.  Given her comorbidities, however, we will monitor closely on      telemetry. 2. Hypertension.  Again, the patient's hypertension is likely      secondary to abrupt  withdrawal to propranolol over the past 24      hours.  We will resume this medications and monitor her blood  pressure for return to a goal of less than 140/85.  3. Diabetes.  The patient likely has excellent control.  Nevertheless,      we will check a hemoglobin A1c to ensure that she is adequately      controlled.  We will resume her Amaryl and check her sugars before      meals and at bedtime.  We will cover with sliding scale insulin if      indicated.  4. Hypercholesterolemia.  Will check a fasting lipid panel in the      morning and continue her usual antihyperlipidemic medications.  5. Headaches.  Will resume the patient's home doses of her medications      for prophylaxis.  6. Osteoarthritis.  Continue the patient's meloxicam and supplement      this with Tylenol or oxycodone if needed for breakthrough pain.  7. Mild hyponatremia.  Will check a BMET in the morning.  8. Prophylaxis.  Will initiate Lovenox for deep vein thrombosis      prophylaxis and Protonix for gastrointestinal prophylaxis.      Jacquelynn Cree, M.D.  Electronically Signed     CR/MEDQ  D:  05/10/2006  T:  05/10/2006  Job:  [id number]   cc:   [name], M.D.

## 2010-10-07 NOTE — Op Note (Signed)
NAME:  Tamara Arnold, Tamara Arnold                         ACCOUNT NO.:  [id number]   MEDICAL RECORD NO.:  [id number]                   PATIENT TYPE:  AMB   LOCATION:  ENDO                                 FACILITY:  [facility]   PHYSICIAN:  Nelwyn Salisbury, M.D.               DATE OF BIRTH:  05/09/1926   DATE OF PROCEDURE:  06/10/2002  DATE OF DISCHARGE:                                 OPERATIVE REPORT   PROCEDURE PERFORMED:  Colonoscopy up to the hepatic flexure.   ENDOSCOPIST:  [name], M.D.   INSTRUMENT USED:  Olympus video colonoscope (adjustable pediatric scope)/   INDICATIONS FOR PROCEDURE:  Iron deficiency anemia in a 77 year old white  female with an unrevealing EGD.  Rule out colonic polyps, masses, etc.   PREPROCEDURE PREPARATION:  Informed consent was procured from the patient.  The patient was fasted for eight hours prior to the procedure and prepped  with a bottle of MiraLax and Gatorade the night prior to the procedure.   PREPROCEDURE PHYSICAL:  The patient had stable vital signs.  Neck supple.  Chest clear to auscultation.  S1 and S2 regular.  Abdomen soft with mesh  present in the right lower quadrant from previous hernia surgery.  No  hepatosplenomegaly, no masses palpable.   DESCRIPTION OF PROCEDURE:  The patient was placed in left lateral decubitus  position and sedated with an additional 40 mg of Demerol.  Once the patient  was adequately sedated and maintained on low flow oxygen and continuous  cardiac monitoring, the pediatric Olympus video colonoscope was advanced  from the rectum to the hepatic flexure with difficulty.  There was  significant tortuosity of the colon.  The patient's position was changed  from the left lateral to the supine and the right lateral position to reach  the cecal base but the scope could not be advanced beyond the hepatic  flexure probably secondary to adhesions from previous surgery.  No masses,  polyps, erosions, ulcerations or  diverticula were seen.  Abdominal pressure  was applied to facilitate advancement of the scope but we could not reach  the cecum.  The procedure was aborted at this point with plans for a barium  enema to complete the evaluation.    IMPRESSION:  1. Normal exam up to the hepatic flexure.  2. __________ BE to complete evaluation.  This will be done today to rule     out cecal and right colon lesions.                                                    Nelwyn Salisbury, M.D.    JNM/MEDQ  D:  06/10/2002  T:  06/10/2002  Job:  [id number]   cc:   [name], M.D.  [address]  [place]   [id number]  Fax: [phone]

## 2010-10-07 NOTE — Op Note (Signed)
   NAME:  Tamara Arnold, Tamara Arnold                         ACCOUNT NO.:  [id number]   MEDICAL RECORD NO.:  [id number]                   PATIENT TYPE:  AMB   LOCATION:  ENDO                                 FACILITY:  [facility]   PHYSICIAN:  Nelwyn Salisbury, M.D.               DATE OF BIRTH:  [birth year]   DATE OF PROCEDURE:  06/10/2002  DATE OF DISCHARGE:                                 OPERATIVE REPORT   PROCEDURE PERFORMED:  Esophagogastroduodenoscopy.   ENDOSCOPIST:  Nelwyn Salisbury, M.D.   INSTRUMENT USED:  Olympus video panendoscope.   INDICATIONS FOR PROCEDURE:  A 77 year old white female with a history of  iron deficiency anemia.  Rule out peptic ulcer disease, esophagitis, AVM,  etc.   PREPROCEDURE PREPARATION:  Informed consent was procured from the patient.  The patient had fasted for eight hours prior to the procedure.   PREPROCEDURE PHYSICAL:  VITAL SIGNS:  The patient has stable vital signs.  NECK:  Supple.  CHEST:  Clear to auscultation, S1, S2.  ABDOMEN:  Soft with normal bowel sounds.   DESCRIPTION OF PROCEDURE:  The patient was placed in the left lateral  decubitus position and sedated with 40 mg of Demerol and 4 mg of Versed  intravenously.  Once the patient was adequately sedated and maintained on  low-flow oxygen and continuous cardiac monitoring, the Olympus video  panendoscope was advanced through the mouthpiece, over the tongue, into the  esophagus under direct vision.  The entire esophagus appeared normal with no  evidence of ring, stricture, masses, esophagitis, or Barrett's mucosa.  The  scope was then advanced into the stomach.  The mucosa in the proximal small  bowel appeared normal and without lesions.   IMPRESSION:  Normal esophagogastroduodenoscopy.    RECOMMENDATIONS:  1. Proceed with a colonoscopy at this time.  2. Further recommendations to be made after the colonoscopy.                                               Nelwyn Salisbury, M.D.    JNM/MEDQ  D:  06/10/2002  T:  06/10/2002  Job:  [id number]   cc:   [name], M.D.  [address]  [place]  [place] [address]  Fax: [phone]

## 2010-10-07 NOTE — Op Note (Signed)
NAME:  Tamara Arnold, Tamara Arnold                         ACCOUNT NO.:  [id number]   MEDICAL RECORD NO.:  [id number]                   PATIENT TYPE:  OIB   LOCATION:  NA                                   FACILITY:  [facility]   PHYSICIAN:  Garey Ham, M.D.             DATE OF BIRTH:  December 23, 1925   DATE OF PROCEDURE:  02/24/2003  DATE OF DISCHARGE:                                 OPERATIVE REPORT   PREOPERATIVE DIAGNOSIS:  Senile cataract, right eye.   POSTOPERATIVE DIAGNOSIS:  Senile cataract, right eye.   OPERATION:  Planned extracapsular cataract extraction, primary insertion of  posterior chamber intraocular lens implant, right eye.   SURGEON:  Garey Ham, M.D.   ASSISTANT:  Nurse.   ANESTHESIA:  Local 4% Xylocaine, 0.75 Marcaine, retrobulbar block, topical  tetracaine, intraocular Xylocaine.  Anesthesia standby required in this  elderly patient.  Patient given sodium Pentothal intravenously during the  period of retrobulbar blocking.   DESCRIPTION OF PROCEDURE:  After the patient was prepped and draped, a lid  speculum was inserted in the right eye.  The eye was turned downward and a  superior rectus traction suture placed.  Schiotz tonometry was recorded at 6  to 7 scale units with a 5.5 g weight.  A peritomy was performed adjacent to  the limbus from the 11 to 1 o'clock position.  The corneoscleral junction  was cleaned and a corneoscleral groove made with a 45 degree Superblade.  The anterior chamber was then entered with the 2.5 mm diamond keratome at  the 12 o'clock position and the 15 degree blade at the 2:30 position.  Using  a bent 26-gauge needle on a Occucoat syringe, a circular capsulorhexis was  begun and then completed with the Grabow forceps.  Hydrodissection and  hydrodelineation were performed using 1% Xylocaine.  The 30-degree  phakoemulsification tip was then inserted with slow, controlled  emulsification with lens nucleus with back cracking with the  Bechert pick.  Total ultrasonic time: 56 seconds.  Average power level: 17%.  Total amount  of fluid used: 50 ml.  Following removal of the nucleus, the residual cortex  was aspirated with the irrigation aspiration tip.  The posterior capsule  appeared intact with a brilliant red fundus reflex.  It was, therefore,  elected to insert an Aller[employer] [id number] silicone three-piece  posterior chamber intraocular lens implant, diopter strength +20.00.  This  was inserted with the McDonald forceps into the anterior chamber and then  centered into the capsular bag using the [facility] rotator.  The lens  appeared to be well centered.  The Healon and Occucoat, which had been used  during the procedure, was aspirated and replaced with balanced salt solution  and Miochol ophthalmic solution.  The operative incisions appeared to be  self sealing, and no sutures were required.  Maxitrol ointment was instilled  in the conjunctival cul-de-sac and a  light patch and protector shield  applied.  Duration of procedure and anesthesia administration: 45 minutes.  The patient tolerated the procedure well in general and left the operating  room for the recovery room in good condition.                                                Garey Ham, M.D.   HNJ/MEDQ  D:  02/24/2003  T:  02/24/2003  Job:  [id number]

## 2010-10-07 NOTE — Op Note (Signed)
Tamara Arnold, Tamara Arnold                        ACCOUNT NO.:  [id number]   MEDICAL RECORD NO.:  [id number]                   PATIENT TYPE:  INP   LOCATION:  X003                                 FACILITY:  [facility]   PHYSICIAN:  Marland Kitchen T. Hoxworth, M.D.          DATE OF BIRTH:  Mar 23, 1926   DATE OF PROCEDURE:  01/28/2002  DATE OF DISCHARGE:                                 OPERATIVE REPORT   PREOPERATIVE DIAGNOSIS:  Ventral incisional hernia.   PREOPERATIVE DIAGNOSIS:  Ventral incisional hernia.   OPERATIVE PROCEDURE:  Laparoscopic repair of ventral incisional hernia.   SURGEON:  Darene Lamer. Hoxworth, M.D.   ANESTHESIA:  General.   INDICATIONS:  The patient is a 77 year old white female with a longstanding,  gradually enlarging lower abdominal incisional hernia with a previous  history of a hysterectomy through a low midline incision. Examination  reveals a large hernia mass partially reducible in the lower abdomen.  Laparoscopic repair has been recommended and accepted. The nature of the  procedure, indications and risks including the risk of bleeding, infection,  intestinal injury and possible need for open surgery and risk of recurrent  hernia were all discussed and understood. She is now brought to the  operating room for this procedure.   DESCRIPTION OF PROCEDURE:  The patient was brought to the operating room and  placed in a supine position on the operating table and general endotracheal  anesthesia was induced. The abdomen was widely sterilely prepped and draped.  A Foley catheter was placed. She was given preoperative antibiotics. Local  anesthesia was used to infiltrate a 1 cm area in the left flank and  dissection carried down to the fascia which was incised for 1 cm. The  internal oblique was bluntly split and the transverse peritoneum entered  under direct vision. A mattress suture of #0 Vicryl was placed and the  Hasson trocar inserted and pneumoperitoneum  established. There were some  omental adhesions to the midline incision around the umbilicus and just  below this a fairly discrete hernia defect. This contained small bowel.  Under direct vision a 5 mm trocar was placed in the left lower quadrant.  Using the atraumatic graspers, a large amount of small bowel was reduced  from the hernia without any significant adhesions of bowel to the hernia  sac. Eventually there were just a few adhesions of the mesentery to the edge  of the hernia sac which were taken down with the harmonic scalpel. The  omental adhesions to the upper midline and entire lower abdomen and anterior  abdominal wall were completely taken down with the harmonic scalpel. The  hernia defect was very discrete just to the right of midline about halfway  between the umbilicus and the pubis and measured about 5 cm in diameter. A  12 cm diameter piece of Sofraderm laparoscopic mesh was used. Four #0  Prolene sutures were placed in four quadrants and the mesh  moistened and  rolled in place in the abdominal cavity and that unfurled with the gel side  down. Using the endoclose through four small stab wounds the sutures were  retrieved and brought up to the anterior abdominal wall and the mesh brought  up with a nice broad coverage of the defect with at least 4-5 cm overlapping  in all directions. The sutures were secured. The mesh was then tacked in its  periphery with the endotacker. The bed was irrigated and complete hemostasis  assured. The trocars were removed under direct vision and all were seen to  evacuate the peritoneal cavity. The mattress suture was secured in the left  lower quadrant  incision. The skin incisions were closed with interrupted subcuticular 4-0  Monocryl and Steri-Strips. The sponge, needle and instrument counts were  correct. Gauze dressing was applied and the patient was taken to the  recovery room in good condition.                                                 Darene Lamer. Hoxworth, M.D.    [place]to [place]  D:  01/28/2002  T:  01/28/2002  Job:  [id number]

## 2010-10-07 NOTE — H&P (Signed)
NAME:  Tamara Arnold, Tamara Arnold                         ACCOUNT NO.:  [id number]   MEDICAL RECORD NO.:  [id number]                   PATIENT TYPE:  OIB   LOCATION:  NA                                   FACILITY:  [facility]   PHYSICIAN:  Garey Ham, M.D.             DATE OF BIRTH:  1925/12/10   DATE OF ADMISSION:  02/24/2003  DATE OF DISCHARGE:                                HISTORY & PHYSICAL   This is a planned outpatient surgical admission of this 77 year old white  female admitted for cataract implant surgery of the right eye.   HISTORY OF PRESENT ILLNESS:  This patient has been followed in my office  since October 2002.  At that time, she was referred with mild age-related  macular degeneration, dry type.  Her vision at that time was recorded at  20/70 right eye, 20/30 left eye with correction and 20/30 right eye, 20/25  left eye with correction.  Slit lamp examination revealed the presence of  brunescent nuclear cataract formation with lens vacuoles in both eyes.  Detailed fundus examination showed very mild age-related macular  degeneration with some retinal pigment epithelial defects but no hemorrhage,  exudation, or choroidal neovascular membrane formation.  Over the ensuing  years, the patient has noted a slight deterioration in vision in both eyes.  When she returned to the office on February 04, 2003, vision was recorded  at 20/60- right eye, 20/30 left eye.  It was felt that the nuclear cataracts  had become quite dense and that surgery was indicated in the right eye now,  left eye later.  The patient was given oral discussion and printed  information concerning the procedure and its possible complications.  She  signed an informed consent, and arrangements were made for her outpatient  admission.  There was no change in her age-related macular dry degeneration.   PAST MEDICAL HISTORY:  The patient is in stable health under the care of her  regular physician, Dr. Tollie Pizza.   She is felt to be in satisfactory condition  for the proposed surgery.  Her general health is satisfactory.   MEDICATIONS:  She continues on TriCor, Amaryl, Univasc, Wellbutrin, and  propranolol for non-insulin-dependent diabetes and hypertension.   ALLERGIES:  She has no known drug allergies.   REVIEW OF SYSTEMS:  No cardiorespiratory complaints.   PHYSICAL EXAMINATION:  VITAL SIGNS:  As recorded on admission, blood  pressure 132/62, temperature 97.2, pulse 80, respirations 18.  GENERAL:  The patient is a pleasant, well-nourished, well-developed white  female in no acute distress.  HEENT:  Eyes: Ocular exam as noted above.  CHEST:  Lungs clear to percussion and auscultation.  HEART:  Normal sinus rhythm.  No cardiomegaly, no murmurs.  ABDOMEN:  Negative.  EXTREMITIES:  Negative.    ADMISSION DIAGNOSES:  1. Senile cataract, nuclear type, right eye greater than left eye.  2. Age-related macular degeneration, mild, dry  type.   SURGICAL PLAN:  Cataract implant surgery, right eye now, left eye later as  needed.                                                Garey Ham, M.D.    HNJ/MEDQ  D:  02/24/2003  T:  02/24/2003  Job:  [id number]

## 2010-11-02 ENCOUNTER — Inpatient Hospital Stay (HOSPITAL_COMMUNITY)
Admission: EM | Admit: 2010-11-02 | Discharge: 2010-11-08 | DRG: 103 | Disposition: A | Discharge status: Home Health | Payer: Medicare Other | Attending: Internal Medicine | Admitting: Internal Medicine

## 2010-11-02 ENCOUNTER — Emergency Department (HOSPITAL_COMMUNITY): Payer: Medicare Other

## 2010-11-02 DIAGNOSIS — K219 Gastro-esophageal reflux disease without esophagitis: Secondary | ICD-10-CM | POA: Diagnosis present

## 2010-11-02 DIAGNOSIS — M1990 Unspecified osteoarthritis, unspecified site: Secondary | ICD-10-CM | POA: Diagnosis present

## 2010-11-02 DIAGNOSIS — E871 Hypo-osmolality and hyponatremia: Secondary | ICD-10-CM | POA: Diagnosis present

## 2010-11-02 DIAGNOSIS — E119 Type 2 diabetes mellitus without complications: Secondary | ICD-10-CM | POA: Diagnosis present

## 2010-11-02 DIAGNOSIS — I10 Essential (primary) hypertension: Secondary | ICD-10-CM | POA: Diagnosis present

## 2010-11-02 DIAGNOSIS — E7800 Pure hypercholesterolemia, unspecified: Secondary | ICD-10-CM | POA: Diagnosis present

## 2010-11-02 DIAGNOSIS — G609 Hereditary and idiopathic neuropathy, unspecified: Secondary | ICD-10-CM | POA: Diagnosis present

## 2010-11-02 DIAGNOSIS — M545 Low back pain, unspecified: Secondary | ICD-10-CM | POA: Diagnosis present

## 2010-11-02 DIAGNOSIS — H3530 Unspecified macular degeneration: Secondary | ICD-10-CM | POA: Diagnosis present

## 2010-11-02 DIAGNOSIS — E785 Hyperlipidemia, unspecified: Secondary | ICD-10-CM | POA: Diagnosis present

## 2010-11-02 DIAGNOSIS — R51 Headache: Principal | ICD-10-CM | POA: Diagnosis present

## 2010-11-02 DIAGNOSIS — Z7982 Long term (current) use of aspirin: Secondary | ICD-10-CM

## 2010-11-02 DIAGNOSIS — Z8673 Personal history of transient ischemic attack (TIA), and cerebral infarction without residual deficits: Secondary | ICD-10-CM

## 2010-11-02 LAB — BASIC METABOLIC PANEL
BUN: 12 mg/dL (ref 6–23)
CO2: 27 mEq/L (ref 19–32)
Calcium: 9.6 mg/dL (ref 8.4–10.5)
Glucose, Bld: 131 mg/dL — ABNORMAL HIGH (ref 70–99)
Potassium: 4.3 mEq/L (ref 3.5–5.1)
Sodium: 129 mEq/L — ABNORMAL LOW (ref 135–145)

## 2010-11-02 LAB — CBC
HCT: 28.8 % — ABNORMAL LOW (ref 36.0–46.0)
Hemoglobin: 10.4 g/dL — ABNORMAL LOW (ref 12.0–15.0)
MCH: 30.5 pg (ref 26.0–34.0)
RBC: 3.41 MIL/uL — ABNORMAL LOW (ref 3.87–5.11)

## 2010-11-02 LAB — CK TOTAL AND CKMB (NOT AT ARMC)
CK, MB: 2.4 ng/mL (ref 0.3–4.0)
Relative Index: INVALID (ref 0.0–2.5)

## 2010-11-03 LAB — HEMOGLOBIN A1C
Hgb A1c MFr Bld: 6.4 % — ABNORMAL HIGH (ref <5.7)
Mean Plasma Glucose: 137 mg/dL — ABNORMAL HIGH (ref <117)

## 2010-11-03 LAB — GLUCOSE, CAPILLARY
Glucose-Capillary: 219 mg/dL — ABNORMAL HIGH (ref 70–99)
Glucose-Capillary: 169 mg/dL — ABNORMAL HIGH (ref 70–99)

## 2010-11-03 LAB — MRSA PCR SCREENING: MRSA by PCR: NEGATIVE

## 2010-11-04 LAB — GLUCOSE, CAPILLARY
Glucose-Capillary: 126 mg/dL — ABNORMAL HIGH (ref 70–99)
Glucose-Capillary: 134 mg/dL — ABNORMAL HIGH (ref 70–99)

## 2010-11-05 LAB — BASIC METABOLIC PANEL
GFR calc Af Amer: 59 mL/min — ABNORMAL LOW (ref >60)
GFR calc non Af Amer: 49 mL/min — ABNORMAL LOW (ref >60)
Potassium: 4 mEq/L (ref 3.5–5.1)
Sodium: 134 mEq/L — ABNORMAL LOW (ref 135–145)

## 2010-11-05 LAB — GLUCOSE, CAPILLARY
Glucose-Capillary: 151 mg/dL — ABNORMAL HIGH (ref 70–99)
Glucose-Capillary: 167 mg/dL — ABNORMAL HIGH (ref 70–99)
Glucose-Capillary: 165 mg/dL — ABNORMAL HIGH (ref 70–99)

## 2010-11-06 LAB — CBC
Hemoglobin: 12 g/dL (ref 12.0–15.0)
MCH: 30.5 pg (ref 26.0–34.0)
MCHC: 35.1 g/dL (ref 30.0–36.0)

## 2010-11-06 LAB — GLUCOSE, CAPILLARY
Glucose-Capillary: 196 mg/dL — ABNORMAL HIGH (ref 70–99)
Glucose-Capillary: 142 mg/dL — ABNORMAL HIGH (ref 70–99)
Glucose-Capillary: 149 mg/dL — ABNORMAL HIGH (ref 70–99)

## 2010-11-07 LAB — BASIC METABOLIC PANEL
BUN: 26 mg/dL — ABNORMAL HIGH (ref 6–23)
CO2: 31 mEq/L (ref 19–32)
Calcium: 10.5 mg/dL (ref 8.4–10.5)
Chloride: 97 mEq/L (ref 96–112)
Creatinine, Ser: 0.97 mg/dL (ref 0.50–1.10)
GFR calc Af Amer: >60 mL/min (ref >60)
GFR calc non Af Amer: 55 mL/min — ABNORMAL LOW (ref >60)
Glucose, Bld: 85 mg/dL (ref 70–99)
Potassium: 3.6 mEq/L (ref 3.5–5.1)
Sodium: 134 mEq/L — ABNORMAL LOW (ref 135–145)

## 2010-11-07 LAB — GLUCOSE, CAPILLARY
Glucose-Capillary: 73 mg/dL (ref 70–99)
Glucose-Capillary: 144 mg/dL — ABNORMAL HIGH (ref 70–99)
Glucose-Capillary: 55 mg/dL — ABNORMAL LOW (ref 70–99)
Glucose-Capillary: 67 mg/dL — ABNORMAL LOW (ref 70–99)
Glucose-Capillary: 83 mg/dL (ref 70–99)
Glucose-Capillary: 203 mg/dL — ABNORMAL HIGH (ref 70–99)

## 2010-11-08 LAB — GLUCOSE, CAPILLARY: Glucose-Capillary: 171 mg/dL — ABNORMAL HIGH (ref 70–99)

## 2010-11-08 NOTE — Discharge Summary (Signed)
NAMEMarland Kitchen  Tamara Arnold, Tamara Arnold NO.:  [id number]  MEDICAL RECORD NO.:  [id number]  LOCATION:  3305                         FACILITY:  [facility]  PHYSICIAN:  Cherene Altes, M.D.DATE OF BIRTH:  11-15-1925  DATE OF ADMISSION:  11/02/2010 DATE OF DISCHARGE:  11/08/2010                              DISCHARGE SUMMARY   PRIMARY CARE PHYSICIAN:  [name], MD  DISCHARGE DIAGNOSES: 1. Malignant hypertension.     a.     Quite difficult to control during hospital stay.     b.     Vacillated with episode of abrupt hypotension in face of      treatment.     c.     Stabilized at time of discharge. 2. Severe intractable headache.     a.     History of chronic headache.     b.     Exact etiology unclear.     c.     No acute findings on CT scan of the head.     d.     Symptoms resolved at discharge. 3. Hyponatremia - resolved with hydration. 4. Diabetes mellitus - no acute changes in treatment required. 5. Hypercholesterolemia - on medical therapy. 6. Gastroesophageal reflux disease. 7. Mild chronic low back pain. 8. Osteoarthritis. 9. Macular degeneration. 10.Status post appendectomy. 11.Status post hernia repair. 12.Status post hysterectomy. 13.Status post cholecystectomy.  DISCHARGE MEDICATIONS:  A complete list of the patient's discharge medications available as per the discharge med manager portion of the patient's [place] computer file.  The only significant changes made during her hospital stay was the include the increase of her Inderal to t.i.d. dosing and the addition of Norvasc 5 mg daily.  FOLLOWUP:  The patient is advised to follow up with Dr. Tollie Pizza, within 7-10 days for reevaluation of her blood pressure.  CONSULTATIONS:  None.  PROCEDURES:  CT scan of the head on November 03, 2010 - mild atrophy.  No evidence of acute intracranial abnormality.  Mild chronic sphenoid sinusitis.  HOSPITAL COURSE:  Tamara Arnold is a very pleasant 77 year old female  with a past medical history as noted above.  She presented to hospital on the day of her admission with a 2-day history of severe vertex headache.  This was associated with some nausea, but no significant vomiting.  In the emergency room, she was found to have a systolic blood pressure of 123456 with a diastolic of 71.  She is also found to be hyponatremia with a serum sodium of 129.  The patient was placed in the acute unit.  A CT scan of the head was accomplished and this revealed no acute findings.  The patient was placed in the Step- Down Unit to allow close blood pressure monitoring and treatment. Intravenous medications were added.  On the second day of her hospital stay, the patient was transitioned over to p.o. medications.  She abruptly dropped her blood pressure.  As a result, she required an additional night in the Step-Down Unit.  With discontinuation of the multiple oral agents and determining back to fever agents the patient's blood pressure improved significantly.  As a result, she was felt to be stable to  return to a non step-down floor.  She was transferred. Unfortunately, the patient's hypotension then rebounded back into action and the patient began experiencing systolics in excess of 99991111 again.  As a result, she was transitioned back to the Step-Down Unit.  She remained in the Step-Down Unit for the remainder of her hospital stay to avoid further inconvenience to the patient and diminished risk of sundowning in this elderly female.  With resumption of her oral medications and very slight up titration, the patient's blood pressure became much more improved.  Her headache resolved completely.  With simple volume repletion, her hyponatremia resolved.  Her other chronic medical problems remained stable.  She was evaluated by Physical Therapy and Occupational Therapy who felt that she was safe for discharge home to independent living situation.  Recommendation was made for home  PT and OT and we are therefore made these arrangements.  At the present time, our goal would be discharged the patient on November 08, 2010, assuming her blood pressure remained stable overnight.  If the patient's hospital stay requires further prolongation, an addition will be dictated.     Cherene Altes, M.D.     JTM/MEDQ  D:  11/07/2010  T:  11/07/2010  Job:  [id number]  cc:   [name], M.D.  Electronically Signed by Joette Catching M.D. on 11/08/2010 12:14:19 PM

## 2010-11-09 LAB — GLUCOSE, CAPILLARY

## 2010-11-10 ENCOUNTER — Inpatient Hospital Stay (HOSPITAL_COMMUNITY)
Admission: EM | Admit: 2010-11-10 | Discharge: 2010-11-12 | DRG: 305 | Disposition: A | Discharge status: Home / Self Care | Payer: Medicare Other | Attending: Internal Medicine | Admitting: Internal Medicine

## 2010-11-10 DIAGNOSIS — M1990 Unspecified osteoarthritis, unspecified site: Secondary | ICD-10-CM | POA: Diagnosis present

## 2010-11-10 DIAGNOSIS — E119 Type 2 diabetes mellitus without complications: Secondary | ICD-10-CM | POA: Diagnosis present

## 2010-11-10 DIAGNOSIS — I10 Essential (primary) hypertension: Principal | ICD-10-CM | POA: Diagnosis present

## 2010-11-10 DIAGNOSIS — E785 Hyperlipidemia, unspecified: Secondary | ICD-10-CM | POA: Diagnosis present

## 2010-11-10 DIAGNOSIS — K219 Gastro-esophageal reflux disease without esophagitis: Secondary | ICD-10-CM | POA: Diagnosis present

## 2010-11-10 DIAGNOSIS — H3530 Unspecified macular degeneration: Secondary | ICD-10-CM | POA: Diagnosis present

## 2010-11-10 DIAGNOSIS — Z8673 Personal history of transient ischemic attack (TIA), and cerebral infarction without residual deficits: Secondary | ICD-10-CM

## 2010-11-10 DIAGNOSIS — D649 Anemia, unspecified: Secondary | ICD-10-CM | POA: Diagnosis present

## 2010-11-10 DIAGNOSIS — R51 Headache: Secondary | ICD-10-CM | POA: Diagnosis present

## 2010-11-10 DIAGNOSIS — Z7982 Long term (current) use of aspirin: Secondary | ICD-10-CM

## 2010-11-11 ENCOUNTER — Emergency Department (HOSPITAL_COMMUNITY): Payer: Medicare Other

## 2010-11-11 LAB — BASIC METABOLIC PANEL
CO2: 25 mEq/L (ref 19–32)
Chloride: 105 mEq/L (ref 96–112)
Glucose, Bld: 130 mg/dL — ABNORMAL HIGH (ref 70–99)
Sodium: 138 mEq/L (ref 135–145)

## 2010-11-11 LAB — GLUCOSE, CAPILLARY
Glucose-Capillary: 127 mg/dL — ABNORMAL HIGH (ref 70–99)
Glucose-Capillary: 189 mg/dL — ABNORMAL HIGH (ref 70–99)

## 2010-11-11 LAB — COMPREHENSIVE METABOLIC PANEL
Alkaline Phosphatase: 40 U/L (ref 39–117)
BUN: 13 mg/dL (ref 6–23)
CO2: 28 mEq/L (ref 19–32)
Chloride: 99 mEq/L (ref 96–112)
GFR calc Af Amer: >60 mL/min (ref >60)
GFR calc non Af Amer: >60 mL/min (ref >60)
Glucose, Bld: 210 mg/dL — ABNORMAL HIGH (ref 70–99)
Potassium: 3.8 mEq/L (ref 3.5–5.1)
Total Bilirubin: 0.2 mg/dL — ABNORMAL LOW (ref 0.3–1.2)

## 2010-11-11 LAB — IRON AND TIBC
Iron: 54 ug/dL (ref 42–135)
Saturation Ratios: 19 % — ABNORMAL LOW (ref 20–55)
TIBC: 284 ug/dL (ref 250–470)
UIBC: 230 ug/dL

## 2010-11-11 LAB — DIFFERENTIAL
Basophils Relative: 0 % (ref 0–1)
Eosinophils Absolute: 0.1 10*3/uL (ref 0.0–0.7)
Lymphs Abs: 1.7 10*3/uL (ref 0.7–4.0)
Neutro Abs: 2.2 10*3/uL (ref 1.7–7.7)
Neutrophils Relative %: 45 % (ref 43–77)

## 2010-11-11 LAB — CBC
HCT: 30 % — ABNORMAL LOW (ref 36.0–46.0)
Hemoglobin: 10.3 g/dL — ABNORMAL LOW (ref 12.0–15.0)
Hemoglobin: 9.5 g/dL — ABNORMAL LOW (ref 12.0–15.0)
MCV: 86.1 fL (ref 78.0–100.0)
Platelets: 228 10*3/uL (ref 150–400)
RBC: 3.17 MIL/uL — ABNORMAL LOW (ref 3.87–5.11)
WBC: 4.9 10*3/uL (ref 4.0–10.5)
WBC: 5.6 10*3/uL (ref 4.0–10.5)

## 2010-11-11 LAB — MAGNESIUM: Magnesium: 1.6 mg/dL (ref 1.5–2.5)

## 2010-11-11 LAB — ABO/RH: ABO/RH(D): A POS

## 2010-11-11 LAB — FERRITIN: Ferritin: 181 ng/mL (ref 10–291)

## 2010-11-13 LAB — CROSSMATCH: Unit division: 0

## 2010-11-14 NOTE — H&P (Signed)
NAMEMarland Kitchen  Arnold, ZOOK NO.:  [id number]  MEDICAL RECORD NO.:  [id number]  LOCATION:  [facility]                         FACILITY:  [facility]  PHYSICIAN:  Rise Patience, MDDATE OF BIRTH:  [birth year]  DATE OF ADMISSION:  11/10/2010 DATE OF DISCHARGE:                             HISTORY & PHYSICAL   PRIMARY CARE PHYSICIAN:  Tamara Craver, MD.  CHIEF COMPLAINT:  Headache.  HISTORY OF PRESENT ILLNESS:  An 77 year old female, who just admitted a week ago with high blood pressure and headache, was discharged on June 19, three days ago.  The patient states she was feeling better at discharge, but from yesterday again the headache recurred and her blood pressure was running high and she came to the ER.  The headache is mostly in the frontal part of the head.  Has no associated visual symptoms, nausea, vomiting, or dizziness.  She did not lose consciousness.  Had no difficulty speaking or swallowing.  In the ER,the patient was initially found to have blood pressure in the 99991111 systolic.  The patient has had a CT of the head again, at this time, which shows nothing acute.  After the patient was given 2 mg of IV morphine, her blood pressure has improved and also her headaches has improved.  At this time as the patient has been having recurrent headache, the patient is admitted for observation.  The patient denies any chest pain, shortness of breath.  Denies any dizziness, loss of consciousness.  Denies any focal deficit.  Denies any nausea, vomiting, abdominal pain, dysuria, discharge, diarrhea.  PAST MEDICAL HISTORY:  Chronic headache, almost for 30 years, but for the last 2 weeks, it has been worsened; history of hypertension; history of diabetes mellitus type 2; hyperlipidemia; history of Bell's palsy; GERD; hyperlipidemia; low-back pain; history of TIA; history of peripheral neuropathy; osteoarthritis; and macular degeneration.  PAST SURGICAL HISTORY:  Status post  appendectomy, hernia repair, hysterectomy, and cholecystectomy.  MEDICATIONS PRIOR TO ADMISSION:  The patient states she takes the same medication when she was discharged 3 days ago which includes; 1. Amlodipine 5 mg p.o. daily. 2. Propranolol 40 mg p.o. t.i.d. 3. Vicodin. 4. Amitriptyline 25 mg p.o. at bedtime. 5. Aspirin 81 mg p.o. daily. 6. Calcium carbonate. 7. Vitamin D. 8. Clonazepam 1 mg p.o. twice daily as needed. 9. Colace. 10.Crestor 10 mg p.o. daily. 11.Fish oil 1200 mg 1 capsule by mouth twice daily. 12.Folic acid 2 mg by mouth twice daily. 13.Glimepiride 2 mg by mouth daily. 14.Meloxicam 15 mg p.o. daily. 15.Moexipril 7.5 mg p.o. daily. 16.Multivitamin 1 tablet p.o. daily. 17.Nexium 40 mg p.o. daily. 18.Ocuvite 1 tablet p.o. daily. 19.Perphenazine 4 mg by mouth daily at bedtime. 20.TriCor 145 mg p.o. daily. 21.Vitamin C. 22.Vitamin E. 23.Wellbutrin XL 300 mg p.o. daily.  ALLERGIES:  PREDNISONE.  SOCIAL HISTORY:  The patient denies smoking cigarettes, drinking alcohol, or use illegal drugs.  She is a full code.  Lives with her husband.  FAMILY HISTORY:  Nothing contributory.  REVIEW OF SYSTEMS:  As per in the history of presenting illness.  In addition, the patient has some numbness on the left foot, it has been there almost a week  now.  The patient also has history of peripheral neuropathy.  PHYSICAL EXAMINATION:  GENERAL:  The patient examined at bedside, not in acute distress. VITAL SIGNS:  Blood pressure 160/60, pulse 76 per minute, temperature 97.9, respirations 18 per minute, O2 sat 98%. HEENT:  Anicteric.  No pallor.  No facial asymmetry.  Tongue is midline. No neck rigidity.  PERLA positive, anicteric, no pallor. CHEST:  Bilateral air entry present.  No rhonchi.  No crepitation. HEART:  S1 and S2 heard. ABDOMEN:  Soft, nontender.  Bowel sounds heard. CNS:  The patient is alert, awake, and oriented to time, place, and person.  Moves upper and  lower extremities, 5/5. EXTREMITIES:  Peripheral pulses felt.  No edema.  LABS:  CT of the head without contrast shows no acute intracranial abnormalities.  CBC:  WBC is 4.9, hemoglobin is 9.5, this is a drop from 12 on June 17, hematocrit is 27.3, platelets 228.  Basic metabolic panel:  Sodium [id number], potassium 3, chloride 105, carbon dioxide 25, glucose 130, BUN 15, creatinine 0.6, calcium 8.1.  ASSESSMENT: 1. Intractable headache. 2. Accelerated hypertension. 3. Anemia, normocytic/normochromic. 4. Diabetes mellitus type 2. 5. History of hyperlipidemia. 6. History of gastroesophageal reflux disease. 7. History of low back pain.  PLAN: 1. At this time, admit the patient to telemetry. 2. For headache, which is actually chronic, but has been worsened in     the last 2 weeks, which improved in the last admission, but has     come back again.  The patient states that when her blood pressure     shoots up, her headache worsened.  At this time, her headache has     improved after she was given morphine.  At this time, the blood     pressure also decreased.  At this time, I am going to check a sed     rate.  We are going to continue morphine at least couple more dose.     We will continue Vicodin if the sed rate is high or the headache     does not improve as the patient has just come back again within a     week's time to get a Neurology consult. 3. Accelerated hypertension.  We will continue present medications.  I     see that the patient does have hyperkalemia and she is not on any     diuretics, I am going to check her plasma aldosterone and renin     level to check a ratio of plasma aldosterone and renin ratio to     make sure that the patient does not have any primary     hyperaldosteronism causing her blood pressure uncontrolled.  We     will continue present antihypertensive and keep her on p.r.n.     hydralazine. 4. Anemia.  On the last admission just a week ago, her hemoglobin  is     12, and rechecked done this time is 9.5, which is significant drop     at least 3 grams.  The patient denies having had any nausea,     vomiting, or blood in the stools or black stools.  The patient is     on aspirin and meloxicam, I am going to hold those two now, I am     going to repeat another CBC within a couple of hours.  I am also     going to check her anemia panel.  We should closely follow her  CBC     to make sure there is no further drop in her hemoglobin and follow     her stool for occult blood. 5. Further recommendation based on test order and clinical course.     Rise Patience, MD     ANK/MEDQ  D:  11/11/2010  T:  11/11/2010  Job:  [id number]  cc:   Tamara Arnold, M.D.  Electronically Signed by Gean Birchwood MD on 11/14/2010 07:38:09 AM

## 2010-11-18 LAB — ALDOSTERONE + RENIN ACTIVITY W/ RATIO
ALDO / PRA Ratio: 1.5 Ratio (ref 0.9–28.9)
Aldosterone: 1 ng/dL
PRA LC/MS/MS: 0.68 ng/mL/h (ref 0.25–5.82)

## 2010-11-18 NOTE — H&P (Signed)
NAME:  Tamara Arnold, Tamara Arnold NO.:  [id number]  MEDICAL RECORD NO.:  [id number]  LOCATION:  [facility]                         FACILITY:  [facility]  PHYSICIAN:  Orvan Falconer, MD           DATE OF BIRTH:  Jan 06, 1926  DATE OF ADMISSION:  11/02/2010 DATE OF DISCHARGE:                             HISTORY & PHYSICAL   PRIMARY CARE PHYSICIAN:  [name], MD  ADVANCE DIRECTIVE:  Full code.  REASON FOR ADMISSION:  Headache and severe hypertension.  HISTORY OF PRESENT ILLNESS:  This is an 77 year old female with history of severe hypertension, migraine headache, chronic headache, diabetes, hypercholesterolemia, presents to the emergency room with 2-day history of vertex headache, but no associated neurological symptoms.  She does have some nausea.  She denied omitting any of her medications.  Usually, she has headache with nausea with a migraine but not as severe as this. She was previously admitted for headache that was felt to be chronic. Evaluation in the emergency room shows systolic blood pressure [id number], serum sodium 129, creatinine 0.82.  A head CT show chronic sphenoid sinusitis and some atrophy.  She has a normal white count of 5.1, and hemoglobin of 10.4.  She was given two dosages of labetalol and hydralazine, and her systolic blood pressure is still 193 and with her continue headache, Hospitalist was asked to admit the patient for hypertensive urgency.  She denied any purulent nasal discharge.  PAST MEDICAL HISTORY:  Hypertension, diabetes, hypercholesterolemia, history of Bell palsy, GERD, hyperlipidemia, low back pain, history of TIA, peripheral neuropathy, osteoarthritis, and macular degeneration.  PAST SURGICAL HISTORY:  Status post appendectomy, hernia repair, hysterectomy, and cholecystectomy.  MEDICATIONS: 1. Omega-3. 2. Multivitamin. 3. Crestor 20 mg per day. 4. Folic acid. 5. Perphenazine 4 mg nightly. 6. Nexium 40 mg per day. 7. Amitriptyline 25 mg  nightly. 8. Moexipril 7.5 mg 2 tablets p.o. daily. 9. Propranolol 40 mg t.i.d. 10.Mobic 50 mg per day. 11.Glimepiride 2 mg daily. 12.Aspirin. 13.Wellbutrin 300 mg per day. 14.Clonazepam 1 mg b.i.d. p.r.n. 15.Fenofibrate 160 mg per day.  ALLERGIES:  No known drug allergies.  SOCIAL HISTORY:  She lives with her husband, Weltha Horak, phone number of [phone].  She does not use tobacco, alcohol, or illicit drug use.  FAMILY HISTORY:  Noncontributory.  REVIEW OF SYSTEMS:  Otherwise unremarkable.  PHYSICAL EXAMINATION:  VITAL SIGNS:  Blood pressure 195/75, pulse of 83, respiratory rate 18, and temperature 98.6. GENERAL:  She is alert and oriented, in no apparent distress. HEENT:  Pupils equal, round and reactive.  Sclerae are nonicteric. Throat is clear.  Tongue is midline.  Uvula elevated with phonation. NECK:  Supple.  No stridor. CARDIAC:  S1 and S2, regular.  I did not hear any murmur, rub, or gallop. LUNGS:  Clear. EXTREMITIES:  Leg strength equal bilaterally.  No calf tenderness.  Good distal pulses bilaterally. SKIN:  Warm and dry. NEUROLOGIC:  Nonfocal. PSYCHIATRIC:  Unremarkable as well.  OBJECTIVE FINDINGS:  Head CT show atrophy and sphenoid sinusitis that appear chronic, troponin less than 0.3.  Serum sodium 129, potassium 4.3, creatinine 0.82, blood glucose 131.  White count of 5.1, hemoglobin of 10.4,  and platelet count 227,000.  IMPRESSION:  This is an 77 year old female with history of hypertension, chronic migraine headache, diabetes, hypercholesterolemia, presenting with hypertensive urgency.  I suspect headache is migraine as in status migrainous.  I am not sure if the "chronic sphenoid sinusitis" has anything to do with her "chronic headache".  Since she does not have any nasal discharge, fever or elevated white count, I will elect to hold off on antibiotics. It is certainly a consideration to treat her for 2-3 weeks of oral antibiotics, but we will leave    that to her primary care physician.  For now, I will admitted to Step-Down.  If her blood pressure is not better, we will start her on a Nipride drip.  Will continue all her medications.  She is a full code.  Will be admitted to [facility].     Orvan Falconer, MD     PL/MEDQ  D:  11/03/2010  T:  11/03/2010  Job:  [id number]  Electronically Signed by Orvan Falconer  on 11/18/2010 09:15:26 PM

## 2010-11-19 NOTE — Discharge Summary (Signed)
  NAMEMarland Arnold  ALEXANDRE, LANDRUM NO.:  [id number]  MEDICAL RECORD NO.:  [id number]  LOCATION:  [id number]                         FACILITY:  [facility]  PHYSICIAN:  Romero Belling, MD       DATE OF BIRTH:  1925/09/11  DATE OF ADMISSION:  11/02/2010 DATE OF DISCHARGE:                        DISCHARGE SUMMARY - REFERRING   ADDENDUM: This is an addendum to the discharge dictation summary dictated by Dr. Joette Catching on November 07, 2010, job number [id number].  This addendum is to dictate the patient's discharge medications.  DISCHARGE MEDICATIONS: 1. Amlodipine 5 mg p.o. daily. 2. Propranolol 40 mg p.o. t.i.d. 3. Vicodin 1 tablet by mouth every 6 hours as needed for pain. 4. Amitriptyline 25 mg p.o. at bedtime. 5. Aspirin 81 mg p.o. daily. 6. Calcium carbonate/vitamin D 600 mg 400 international units 1 tablet     p.o. daily. 7. Clonazepam 1 mg p.o. twice daily as needed. 8. Colace over-the-counter 1 capsule by mouth daily as needed. 9. Crestor 10 mg p.o. daily. 10.Fish oil 1200 mg 1 capsule by mouth twice daily. 11.Folic acid 2 tablets by mouth twice daily. 12.Glimepiride 2 mg 1 tablet by mouth daily. 13.Meloxicam 15 mg p.o. daily. 14.Moexipril 7.5 mg p.o. daily. 15.Multivitamins 1 tablet p.o. daily. 16.Nexium 40 mg p.o. daily. 17.Ocuvite 1 tablet p.o. daily. 18.Perphenazine 4 mg 1 tablet by mouth daily at bedtime. 19.TriCor 145 mg 1 tablet by mouth p.o. daily. 20.Vitamin C 500 mg 1 tablet p.o. daily. 21.Vitamin E 400 mg 1 tablet p.o. daily. 22.Wellbutrin XL 300 mg p.o. daily.  DISPOSITION:  Home.  She will get home health PT/OT at discharge and home health RN.  The patient was also complaining of headaches, so I advised the patient to follow up with Dr. [name] in 1 week and also have an outpatient referral to follow up with a neurologist for chronic headaches.  This addendum is done by Dr. Loman Brooklyn on November 08, 2010.     Romero Belling, MD     NH/MEDQ  D:   11/08/2010  T:  11/08/2010  Job:  [id number]  Electronically Signed by Romero Belling MD on 11/19/2010 11:37:51 PM

## 2010-11-21 ENCOUNTER — Emergency Department (HOSPITAL_COMMUNITY)
Admission: EM | Admit: 2010-11-21 | Discharge: 2010-11-21 | Disposition: A | Discharge status: Home / Self Care | Payer: Medicare Other | Attending: Emergency Medicine | Admitting: Emergency Medicine

## 2010-11-21 DIAGNOSIS — I10 Essential (primary) hypertension: Secondary | ICD-10-CM | POA: Insufficient documentation

## 2010-11-21 DIAGNOSIS — E7800 Pure hypercholesterolemia, unspecified: Secondary | ICD-10-CM | POA: Insufficient documentation

## 2010-11-21 DIAGNOSIS — R51 Headache: Secondary | ICD-10-CM | POA: Insufficient documentation

## 2010-11-21 DIAGNOSIS — E119 Type 2 diabetes mellitus without complications: Secondary | ICD-10-CM | POA: Insufficient documentation

## 2010-11-21 LAB — POCT I-STAT, CHEM 8
HCT: 30 % — ABNORMAL LOW (ref 36.0–46.0)
Hemoglobin: 10.2 g/dL — ABNORMAL LOW (ref 12.0–15.0)
Potassium: 3.7 mEq/L (ref 3.5–5.1)
Sodium: 136 mEq/L (ref 135–145)

## 2010-12-06 NOTE — Discharge Summary (Signed)
NAMEMarland Kitchen  Tamara Arnold, Tamara Arnold NO.:  [id number]  MEDICAL RECORD NO.:  [id number]  LOCATION:  [id number]                         FACILITY:  [facility]  PHYSICIAN:  Domenic Polite, MD     DATE OF BIRTH:  25-Aug-1925  DATE OF ADMISSION:  11/10/2010 DATE OF DISCHARGE:                              DISCHARGE SUMMARY   PRIMARY CARE PHYSICIAN:  Dr. Juanita Arnold at [facility].  DISCHARGE DIAGNOSES: 1. Hypertensive urgency resolved. 2. Acute on chronic headaches, improved. 3. Hypertension. 4. Type 2 diabetes. 5. Dyslipidemia. 6. History of Bell palsy. 7. Gastroesophageal reflux disease. 8. Dyslipidemia. 9. History of transient ischemic attack. 10.History of peripheral neuropathy. 11.Osteoarthritis. 12.Macular degeneration. 13.Anemia of chronic disease.  DISCHARGE MEDICATIONS:  Are as follows: 1. Tylenol 650 mg q.4 h. p.r.n. 2. Hydralazine 25 mg p.o. t.i.d. 3. Topiramate (Topamax) 25 mg p.o. at bedtime. 4. Amitriptyline 25 mg p.o. at bedtime. 5. Amlodipine 5 mg daily. 6. Aspirin 81 mg daily. 7. Calcium carbonate vitamin D 600 mg/400 international units 1 tablet     daily. 8. Clonazepam 1 mg p.o. b.i.d. p.r.n. 9. Colace 100 mg p.o. daily p.r.n. 10.Crestor 10 mg daily. 11.Fish oil 1200 mg capsule b.i.d. 12.Folic acid over-the-counter 2 tablets b.i.d. 13.Glimepiride 2 mg p.o. daily. 14.Moexipril 7.5 mg daily. 15.Multivitamin 1 tablet daily. 16.Nexium 40 mg daily. 17.Ocuvite 1 tablet daily. 18.Perphenazine  4 mg p.o. at bedtime. 19.Propranolol 40 mg t.i.d. 20.TriCor 145 mg daily. 21.Vicodin 5/500 1 tablet q.6 h p.r.n. 22.Vitamin C 500 mg daily. 23.Vitamin E 400 mg daily.  DIAGNOSTICS AND INVESTIGATIONS:  CT of the head November 11, 2010 no acute intracranial abnormality.  HOSPITAL COURSE:  Tamara Arnold is a very pleasant 77 year old Caucasian female who was just recently discharged from the hospital after an admission for hypertensive emergency and severe  headaches, who was barely discharged for 3 days and came back to the hospital with severe headaches and high blood pressure again.  On evaluation in the ED her blood pressures were in the mid 99991111 systolic.  She also has severe throbbing headaches.  She was admitted to the hospital treated with some p.r.n. hydralazine. 1. Hypertensive urgency.  She was treated with p.r.n. hydralazine.     Subsequently transitioned to p.o.  She had good clinical response     in terms of blood pressure control also continued on ACE inhibitor     and her amlodipine.  In terms of the etiology she did have serum     renin and aldosterone levels send out, the results of which are     pending at this point.  I have also discontinued her NSAIDS because     this could be a possible contributing factor due to the fact that     her blood pressure continues to persistently remain elevated as     well. 2. Acute on chronic headaches.  This has been an ongoing difficult     issue for the patient for over 30 years.  She has been tried on     several different medicines.  She has been on Inderal,     amitriptyline etc. in the past with only moderate clinical  response.  On reviewing her medicine I noticed that she was on     Wellbutrin, which she has been on for couple of years.  However,     this can cause severe headache in over 30% of patient.  So it is     possible that this is a contributing factor.  I have discontinued     this.  She did not have any focal localizing signs and unremarkable     head CT.  In addition, her blood pressure being elevated as it was     in admission could have been contributing to her headaches as well.     Her headaches have improved with a trial off Topamax and stopping     her Wellbutrin.  Topamax dose could potentially be increased up to     50 and even 100 mg although cautiously in this 77 year old female.  In terms of discontinuing her Wellbutrin, she seems clinically stable off  of this for over 24 hours now.  I have recommended that she follow up with her psychiatrist in a week to consider possible alternative agent if needed.  DISCHARGE FOLLOW-UP:  With Dr. Juanita Arnold.  The patient has an appointment on Thursday.  DISCHARGE CONDITION:  Stable.  Vital Signs: Temperature is 92, pulse 77, blood pressure 156/69, respirations 19, satting 92% room air.  DISCHARGE FOLLOWUP:  Dr. Juanita Arnold on Thursday.     Domenic Polite, MD     PJ/MEDQ  D:  11/12/2010  T:  11/12/2010  Job:  [id number]  cc:   Tamara Arnold, M.D.  Electronically Signed by Domenic Polite  on 12/06/2010 08:06:27 PM

## 2011-05-04 ENCOUNTER — Emergency Department (HOSPITAL_COMMUNITY)
Admission: EM | Admit: 2011-05-04 | Discharge: 2011-05-04 | Disposition: A | Discharge status: Home / Self Care | Payer: Medicare Other | Attending: Emergency Medicine | Admitting: Emergency Medicine

## 2011-05-04 ENCOUNTER — Emergency Department (HOSPITAL_COMMUNITY): Payer: Medicare Other

## 2011-05-04 ENCOUNTER — Encounter: Payer: Self-pay | Admitting: Emergency Medicine

## 2011-05-04 DIAGNOSIS — I10 Essential (primary) hypertension: Secondary | ICD-10-CM | POA: Insufficient documentation

## 2011-05-04 DIAGNOSIS — Z7982 Long term (current) use of aspirin: Secondary | ICD-10-CM | POA: Insufficient documentation

## 2011-05-04 DIAGNOSIS — E119 Type 2 diabetes mellitus without complications: Secondary | ICD-10-CM | POA: Insufficient documentation

## 2011-05-04 DIAGNOSIS — F411 Generalized anxiety disorder: Secondary | ICD-10-CM | POA: Insufficient documentation

## 2011-05-04 DIAGNOSIS — R51 Headache: Secondary | ICD-10-CM

## 2011-05-04 DIAGNOSIS — Z79899 Other long term (current) drug therapy: Secondary | ICD-10-CM | POA: Insufficient documentation

## 2011-05-04 HISTORY — DX: Essential (primary) hypertension: I10

## 2011-05-04 LAB — CBC
Hemoglobin: 10.4 g/dL — ABNORMAL LOW (ref 12.0–15.0)
Platelets: 211 10*3/uL (ref 150–400)
RBC: 3.44 MIL/uL — ABNORMAL LOW (ref 3.87–5.11)
WBC: 4.7 10*3/uL (ref 4.0–10.5)

## 2011-05-04 LAB — POCT I-STAT, CHEM 8
BUN: 27 mg/dL — ABNORMAL HIGH (ref 6–23)
Hemoglobin: 10.2 g/dL — ABNORMAL LOW (ref 12.0–15.0)
Sodium: 141 mEq/L (ref 135–145)
TCO2: 25 mmol/L (ref 0–100)

## 2011-05-04 LAB — DIFFERENTIAL
Lymphs Abs: 1.3 10*3/uL (ref 0.7–4.0)
Monocytes Relative: 12 % (ref 3–12)
Neutro Abs: 2.6 10*3/uL (ref 1.7–7.7)
Neutrophils Relative %: 55 % (ref 43–77)

## 2011-05-04 LAB — POCT I-STAT TROPONIN I: Troponin i, poc: 0.03 ng/mL (ref 0.00–0.08)

## 2011-05-04 MED ORDER — SODIUM CHLORIDE 0.9 % IV BOLUS (SEPSIS)
1000.0000 mL | Freq: Once | INTRAVENOUS | Status: AC
  Administered 2011-05-04: 1000 mL via INTRAVENOUS

## 2011-05-04 MED ORDER — DIPHENHYDRAMINE HCL 50 MG/ML IJ SOLN
12.5000 mg | Freq: Once | INTRAMUSCULAR | Status: AC
  Administered 2011-05-04: 12.5 mg via INTRAVENOUS
  Filled 2011-05-04: qty 1

## 2011-05-04 MED ORDER — ACETAMINOPHEN 325 MG PO TABS
650.0000 mg | ORAL_TABLET | Freq: Once | ORAL | Status: AC
  Administered 2011-05-04: 650 mg via ORAL
  Filled 2011-05-04: qty 2

## 2011-05-04 MED ORDER — DIPHENHYDRAMINE HCL 12.5 MG/5ML PO ELIX: 12.5000 mg | ORAL_SOLUTION | Freq: Once | ORAL | Status: DC

## 2011-05-04 MED ORDER — ONDANSETRON HCL 4 MG/2ML IJ SOLN
4.0000 mg | Freq: Once | INTRAMUSCULAR | Status: AC
  Administered 2011-05-04: 4 mg via INTRAVENOUS
  Filled 2011-05-04: qty 2

## 2011-05-04 NOTE — ED Notes (Signed)
PT. REPORTS ELEVATE BLO0D PRESSURE AT HOME THIS EVENING = 0000000 SYSTOLIC ,  STATES HEADACHE WITH DIZZINESS / STOMACH "UPSET" .

## 2011-05-04 NOTE — ED Provider Notes (Addendum)
History     CSN: [id number] Arrival date & time: 05/04/2011  2:18 AM   First MD Initiated Contact with Patient 05/04/11 0259      Chief Complaint  Patient presents with  . Hypertension    (Consider location/radiation/quality/duration/timing/severity/associated sxs/prior treatment) Patient is a 77 y.o. female presenting with hypertension. The history is provided by the patient and the spouse.  Hypertension This is a recurrent problem. The current episode started 3 to 5 hours ago. The problem occurs hourly. The problem has been gradually improving. Associated symptoms include headaches. Pertinent negatives include no chest pain, no abdominal pain and no shortness of breath. The symptoms are aggravated by nothing. The symptoms are relieved by nothing. Treatments tried: Took an extra blood pressure pill as prescribed. The treatment provided no relief.   The patient has long-standing history of headaches, followed by psychiatry and takes medications for the same. She's had ongoing gradual onset headache for the last few days and tonight became very anxious was waking up regularly to check her blood pressure. Her systolic blood pressure was ranging anywhere from 160-190 so she took an extra blood pressure pill an hour later it was still 190 so she presents here for evaluation. No sudden onset thunderclap headache. No fevers or chills. No neck stiffness. Patient was recently admitted to the hospital for systolic blood pressure of 230 with the same headache which made her very concerned tonight. Her blood pressures improving but she still has persistent headache. No weakness or numbness. No difficulty with speech or ambulation. No new symptoms. This headache is the same in character and quality as her chronic recurrent headaches. Goal in quality and not radiating. Past Medical History  Diagnosis Date  . Hypertension   . Diabetes mellitus     Past Surgical History  Procedure Date  .  Cholecystectomy   . Abdominal hysterectomy   . Appendectomy   . Hernia repair     No family history on file.  History  Substance Use Topics  . Smoking status: Never Smoker   . Smokeless tobacco: Not on file  . Alcohol Use: No    OB History    Grav Para Term Preterm Abortions TAB SAB Ect Mult Living                  Review of Systems  Constitutional: Negative for fever and chills.  HENT: Negative for neck pain and neck stiffness.   Eyes: Negative for pain.  Respiratory: Negative for shortness of breath.   Cardiovascular: Negative for chest pain.  Gastrointestinal: Negative for abdominal pain.  Genitourinary: Negative for dysuria.  Musculoskeletal: Negative for back pain.  Skin: Negative for rash.  Neurological: Positive for headaches. Negative for syncope, speech difficulty, weakness and numbness.  All other systems reviewed and are negative.    Allergies  Prednisone and Wellbutrin  Home Medications   Current Outpatient Rx  Name Route Sig Dispense Refill  . ASCORBIC ACID 500 MG PO TABS Oral Take 500 mg by mouth daily.      . ASPIRIN 81 MG PO CHEW Oral Chew 81 mg by mouth daily.      . OCUVITE PO TABS Oral Take 1 tablet by mouth daily.      Marland Kitchen CLONAZEPAM 1 MG PO TABS Oral Take 0.5 mg by mouth daily as needed. For anxiety     . FENOFIBRATE 145 MG PO TABS Oral Take 145 mg by mouth daily.      . OMEGA-3 FATTY ACIDS 1000  MG PO CAPS Oral Take 2 g by mouth daily.      Marland Kitchen FOLIC ACID 1 MG PO TABS Oral Take 1 mg by mouth 2 (two) times daily.      Marland Kitchen GLIMEPIRIDE 2 MG PO TABS Oral Take 2 mg by mouth daily before breakfast.      . HYDRALAZINE HCL 25 MG PO TABS Oral Take 25 mg by mouth 4 (four) times daily.      Marland Kitchen MOEXIPRIL HCL 7.5 MG PO TABS Oral Take 7.5 mg by mouth at bedtime.      Creed Copper M PLUS PO TABS Oral Take 1 tablet by mouth daily.      Marland Kitchen OMEPRAZOLE 20 MG PO CPDR Oral Take 20 mg by mouth daily.      Marland Kitchen PERPHENAZINE 4 MG PO TABS Oral Take 4 mg by mouth daily.      Marland Kitchen  PERPHENAZINE-AMITRIPTYLINE 2-25 MG PO TABS Oral Take 1 tablet by mouth daily.      Marland Kitchen PROPRANOLOL HCL 60 MG PO TABS Oral Take 60 mg by mouth 3 (three) times daily.      Marland Kitchen ROSUVASTATIN CALCIUM 10 MG PO TABS Oral Take 10 mg by mouth daily.      . TOPIRAMATE 25 MG PO CPSP Oral Take 25 mg by mouth 2 (two) times daily.      Marland Kitchen VITAMIN C 500 MG PO TABS Oral Take 500 mg by mouth daily.      Marland Kitchen VITAMIN E 400 UNITS PO CAPS Oral Take 400 Units by mouth daily.        BP 140/61  Pulse 71  Temp(Src) 98 F (36.7 C) (Oral)  Resp 17  SpO2 98%  Physical Exam  Constitutional: She is oriented to person, place, and time. She appears well-developed and well-nourished.  HENT:  Head: Normocephalic and atraumatic.  Eyes: Conjunctivae and EOM are normal. Pupils are equal, round, and reactive to light.  Neck: Full passive range of motion without pain. Neck supple. No thyromegaly present.       No meningismus  Cardiovascular: Normal rate, regular rhythm, S1 normal, S2 normal and intact distal pulses.   Pulmonary/Chest: Effort normal and breath sounds normal.  Abdominal: Soft. Bowel sounds are normal. There is no tenderness. There is no CVA tenderness.  Musculoskeletal: Normal range of motion.  Neurological: She is alert and oriented to person, place, and time. She has normal strength and normal reflexes. No cranial nerve deficit or sensory deficit. She displays a negative Romberg sign. GCS eye subscore is 4. GCS verbal subscore is 5. GCS motor subscore is 6.       Normal Gait  Skin: Skin is warm and dry. No rash noted. No cyanosis. Nails show no clubbing.  Psychiatric: She has a normal mood and affect. Her speech is normal and behavior is normal.    ED Course  Procedures (including critical care time)  Labs Reviewed  CBC - Abnormal; Notable for the following:    RBC 3.44 (*)    Hemoglobin 10.4 (*)    HCT 30.4 (*)    All other components within normal limits  POCT I-STAT, CHEM 8 - Abnormal; Notable for the  following:    BUN 27 (*)    Creatinine, Ser 1.40 (*)    Glucose, Bld 155 (*)    Hemoglobin 10.2 (*)    HCT 30.0 (*)    All other components within normal limits  DIFFERENTIAL  POCT I-STAT TROPONIN I  I-STAT TROPONIN I  I-STAT, CHEM 8   Dg Chest 2 View  05/04/2011  *RADIOLOGY REPORT*  Clinical Data: Headache and high blood pressure  CHEST - 2 VIEW  Comparison: 05/09/2006  Findings: Borderline heart size with normal pulmonary vascularity. No focal airspace consolidation in the lungs.  No blunting of costophrenic angles.  No pneumothorax.  Degenerative changes in the spine.  No significant change since prior study.  IMPRESSION: No evidence of active pulmonary disease.  Original Report Authenticated By: Neale Burly, M.D.    Date: 05/04/2011  Rate: 76  Rhythm: normal sinus rhythm  QRS Axis: normal  Intervals: normal  ST/T Wave abnormalities: nonspecific ST changes  Conduction Disutrbances:none  Narrative Interpretation:   Old EKG Reviewed: unchanged    Blood pressure normalizing serial evaluations. Headache cocktail provided with IV fluids   MDM   Headache and elevated blood pressure. Serial exams no neuro deficits. Blood pressure normalizing. Headache improving with medications provided and IV fluids. Patient stable for discharge home, does not require IV medications for blood pressure control. She is very anxious and takes Klonopin for the same. Plan continue home medications and close primary care followup as needed for chronic symptoms        Teressa Lower, MD 05/04/11 [id number]  Teressa Lower, MD 05/04/11 [phone]

## 2011-05-04 NOTE — ED Notes (Signed)
Pt states that she was seen 6 months ago for high BP. Pt states that her BP got up to 123456 systolic and she was seen in the ICU pressure came down pt went home, next day pt states BP went up again [id number] systolic pt came in ICU treated and sent home with rx medications. Pt has been taking medications for the past 6 months. Pt states that 2 days ago she has been having HA before she goes to sleep. Pt took her BP tonight with her HA and her BP was [id number] systolic. Pt states that she took her BP med and then went to sleep pt woke up and her BP was 99991111 systolic, she still had her HA and so she decide to come in. Pt has no neurological deficits. Pt has no facial droop, pt alert and oriented able to move extremities and follow commands. Pt has equal grips and strength bilaterally.

## 2011-11-26 ENCOUNTER — Other Ambulatory Visit: Payer: Self-pay

## 2011-11-26 ENCOUNTER — Emergency Department (HOSPITAL_COMMUNITY)
Admission: EM | Admit: 2011-11-26 | Discharge: 2011-11-27 | Disposition: A | Discharge status: Home / Self Care | Payer: Medicare Other | Attending: Emergency Medicine | Admitting: Emergency Medicine

## 2011-11-26 ENCOUNTER — Encounter (HOSPITAL_COMMUNITY): Payer: Self-pay | Admitting: Emergency Medicine

## 2011-11-26 DIAGNOSIS — R51 Headache: Secondary | ICD-10-CM

## 2011-11-26 DIAGNOSIS — I10 Essential (primary) hypertension: Secondary | ICD-10-CM | POA: Insufficient documentation

## 2011-11-26 DIAGNOSIS — K219 Gastro-esophageal reflux disease without esophagitis: Secondary | ICD-10-CM | POA: Insufficient documentation

## 2011-11-26 DIAGNOSIS — Z79899 Other long term (current) drug therapy: Secondary | ICD-10-CM | POA: Insufficient documentation

## 2011-11-26 DIAGNOSIS — E119 Type 2 diabetes mellitus without complications: Secondary | ICD-10-CM | POA: Insufficient documentation

## 2011-11-26 DIAGNOSIS — Z7982 Long term (current) use of aspirin: Secondary | ICD-10-CM | POA: Insufficient documentation

## 2011-11-26 DIAGNOSIS — E785 Hyperlipidemia, unspecified: Secondary | ICD-10-CM | POA: Insufficient documentation

## 2011-11-26 HISTORY — DX: Migraine, unspecified, not intractable, without status migrainosus: G43.909

## 2011-11-26 HISTORY — DX: Hyperlipidemia, unspecified: E78.5

## 2011-11-26 LAB — BASIC METABOLIC PANEL
GFR calc Af Amer: 54 mL/min — ABNORMAL LOW (ref >90)
GFR calc non Af Amer: 47 mL/min — ABNORMAL LOW (ref >90)
Potassium: 3.9 mEq/L (ref 3.5–5.1)
Sodium: 135 mEq/L (ref 135–145)

## 2011-11-26 LAB — POCT I-STAT TROPONIN I

## 2011-11-26 LAB — CBC
MCHC: 34.6 g/dL (ref 30.0–36.0)
RDW: 12.9 % (ref 11.5–15.5)

## 2011-11-26 MED ORDER — LABETALOL HCL 5 MG/ML IV SOLN
20.0000 mg | Freq: Once | INTRAVENOUS | Status: AC
  Administered 2011-11-26: 20 mg via INTRAVENOUS
  Filled 2011-11-26: qty 4

## 2011-11-26 MED ORDER — KETOROLAC TROMETHAMINE 30 MG/ML IJ SOLN
30.0000 mg | Freq: Once | INTRAMUSCULAR | Status: AC
  Administered 2011-11-26: 30 mg via INTRAVENOUS
  Filled 2011-11-26: qty 1

## 2011-11-26 MED ORDER — METOCLOPRAMIDE HCL 5 MG/ML IJ SOLN
10.0000 mg | Freq: Once | INTRAMUSCULAR | Status: AC
  Administered 2011-11-26: 10 mg via INTRAVENOUS
  Filled 2011-11-26: qty 2

## 2011-11-26 NOTE — ED Provider Notes (Signed)
History     CSN: [id number]  Arrival date & time 11/26/11  [id number]   First MD Initiated Contact with Patient 11/26/11 2115      Chief Complaint  Patient presents with  . Hypertension    (Consider location/radiation/quality/duration/timing/severity/associated sxs/prior treatment) HPI Pt with history of HTN and migraines reports she has had worsening headache and elevated BP for the last 3 days. States SBP is typically 150s but has been over 200 today. She took an extra hydralazine with no improvement. She reports diffuse throbbing headache, not associated with blurry vision. No chest pain, SOB, leg swelling. She has had nausea, and one episode of vomiting. Past Medical History  Diagnosis Date  . Hypertension   . Diabetes mellitus   . Migraine   . Reflux   . Hyperlipidemia     Past Surgical History  Procedure Date  . Cholecystectomy   . Abdominal hysterectomy   . Appendectomy   . Hernia repair   . Cystectomy   . Cataract extraction     No family history on file.  History  Substance Use Topics  . Smoking status: Never Smoker   . Smokeless tobacco: Not on file  . Alcohol Use: No    OB History    Grav Para Term Preterm Abortions TAB SAB Ect Mult Living                  Review of Systems All other systems reviewed and are negative except as noted in HPI.   Allergies  Prednisone and Wellbutrin  Home Medications   Current Outpatient Rx  Name Route Sig Dispense Refill  . ASCORBIC ACID 500 MG PO TABS Oral Take 500 mg by mouth daily.      . ASPIRIN 81 MG PO CHEW Oral Chew 81 mg by mouth daily.      . OCUVITE PO TABS Oral Take 1 tablet by mouth 2 (two) times daily.     . BUPROPION HCL ER (XL) 300 MG PO TB24 Oral Take 300 mg by mouth daily.    Marland Kitchen CLONAZEPAM 1 MG PO TABS Oral Take 1 mg by mouth 2 (two) times daily. For anxiety    . DOCUSATE SODIUM 100 MG PO CAPS Oral Take 100 mg by mouth daily as needed. For constipation    . FENOFIBRATE 145 MG PO TABS Oral Take 145 mg  by mouth daily.    . FENOFIBRATE 160 MG PO TABS Oral Take 160 mg by mouth daily.    Marland Kitchen FOLIC ACID 1 MG PO TABS Oral Take 1 mg by mouth 2 (two) times daily.      Marland Kitchen GLIMEPIRIDE 2 MG PO TABS Oral Take 2 mg by mouth daily before breakfast.      . HYDRALAZINE HCL 25 MG PO TABS Oral Take 25 mg by mouth 4 (four) times daily.      . MELOXICAM 15 MG PO TABS Oral Take 15 mg by mouth daily.    Marland Kitchen MOEXIPRIL HCL 7.5 MG PO TABS Oral Take 7.5 mg by mouth at bedtime.      Creed Copper M PLUS PO TABS Oral Take 1 tablet by mouth daily.      Marland Kitchen FISH OIL 1200 MG PO CAPS Oral Take 2 capsules by mouth daily.    Marland Kitchen OMEPRAZOLE 20 MG PO CPDR Oral Take 20 mg by mouth daily.      Marland Kitchen OVER THE COUNTER MEDICATION Oral Take 1 capsule by mouth daily as needed. Papaya enzyme    .  PERPHENAZINE 4 MG PO TABS Oral Take 4 mg by mouth daily.      Marland Kitchen PERPHENAZINE-AMITRIPTYLINE 2-25 MG PO TABS Oral Take 1 tablet by mouth daily.      Marland Kitchen PROPRANOLOL HCL 40 MG PO TABS Oral Take 40 mg by mouth 2 (two) times daily.    Marland Kitchen ROSUVASTATIN CALCIUM 10 MG PO TABS Oral Take 5 mg by mouth daily.     . TOPIRAMATE 25 MG PO CPSP Oral Take 50 mg by mouth at bedtime.     Marland Kitchen VITAMIN C 500 MG PO TABS Oral Take 500 mg by mouth daily.      Marland Kitchen VITAMIN E 400 UNITS PO CAPS Oral Take 400 Units by mouth daily.        BP 220/60  Pulse 67  Temp 98.2 F (36.8 C) (Oral)  Resp 18  SpO2 96%  Physical Exam  Nursing note and vitals reviewed. Constitutional: She is oriented to person, place, and time. She appears well-developed and well-nourished.  HENT:  Head: Normocephalic and atraumatic.  Eyes: EOM are normal. Pupils are equal, round, and reactive to light.  Neck: Normal range of motion. Neck supple.  Cardiovascular: Normal rate, normal heart sounds and intact distal pulses.   Pulmonary/Chest: Effort normal and breath sounds normal.  Abdominal: Bowel sounds are normal. She exhibits no distension. There is no tenderness.  Musculoskeletal: Normal range of motion. She  exhibits no edema and no tenderness.  Neurological: She is alert and oriented to person, place, and time. She has normal strength. No cranial nerve deficit or sensory deficit.  Skin: Skin is warm and dry. No rash noted.  Psychiatric: She has a normal mood and affect.    ED Course  Procedures (including critical care time)  Labs Reviewed  CBC - Abnormal; Notable for the following:    HCT 34.7 (*)     All other components within normal limits  BASIC METABOLIC PANEL - Abnormal; Notable for the following:    Glucose, Bld 188 (*)     GFR calc non Af Amer 47 (*)     GFR calc Af Amer 54 (*)     All other components within normal limits  POCT I-STAT TROPONIN I   No results found.   No diagnosis found.    MDM   Date: 11/26/2011  Rate: 63  Rhythm: normal sinus rhythm  QRS Axis: normal  Intervals: normal  ST/T Wave abnormalities: normal  Conduction Disutrbances: none  Narrative Interpretation: unremarkable  Will given Labetolol to bring down BP in setting of hypertensive urgency. Check for end organ damage. Suspect BP to be causing headache, but possibly a migraine causing BP to be elevated as well.    Care signed out to Dr. Marnette Burgess at shift change.         Evadne Ose B. Karle Starch, MD 11/27/11 [id number]

## 2011-11-26 NOTE — ED Notes (Addendum)
Pt reports headache for 3 days, high BP - normally runs sys 150's, tonight has been running 200's sys; took extra hydralazine at about 18:00; pt reports she also vomited on way to hospital; pt reports hx of migraine- pt reports that this migraine does correlate with previous migraines; denies sensitivity to light; pt neuro intact- no neuro deficits- will recheck BP in a little to give BP med time to work; if elevated still

## 2011-11-26 NOTE — ED Notes (Signed)
Pt states high BP, N/V x 3, HA

## 2011-11-27 MED ORDER — METOCLOPRAMIDE HCL 5 MG/ML IJ SOLN
10.0000 mg | Freq: Once | INTRAMUSCULAR | Status: AC
  Administered 2011-11-27: 10 mg via INTRAVENOUS
  Filled 2011-11-27: qty 2

## 2011-11-27 MED ORDER — ONDANSETRON HCL 4 MG/2ML IJ SOLN
4.0000 mg | Freq: Once | INTRAMUSCULAR | Status: AC
  Administered 2011-11-27: 4 mg via INTRAVENOUS
  Filled 2011-11-27: qty 2

## 2011-11-27 MED ORDER — DIPHENHYDRAMINE HCL 50 MG/ML IJ SOLN
25.0000 mg | Freq: Once | INTRAMUSCULAR | Status: AC
  Administered 2011-11-27: 25 mg via INTRAVENOUS
  Filled 2011-11-27: qty 1

## 2011-11-27 MED ORDER — FENTANYL CITRATE 0.05 MG/ML IJ SOLN
50.0000 ug | Freq: Once | INTRAMUSCULAR | Status: AC
  Administered 2011-11-27: 50 ug via INTRAVENOUS
  Filled 2011-11-27: qty 2

## 2011-11-27 MED ORDER — DEXAMETHASONE SODIUM PHOSPHATE 4 MG/ML IJ SOLN
10.0000 mg | Freq: Once | INTRAMUSCULAR | Status: AC
  Administered 2011-11-27: 10 mg via INTRAVENOUS
  Filled 2011-11-27: qty 2
  Filled 2011-11-27: qty 1

## 2011-11-27 NOTE — ED Provider Notes (Signed)
Patient care assumed from Dr. Karle Starch. On recheck patient still having severe headache, pain medications repeated. Blood pressure improving.  Old records reviewed. I saw patient about a year ago with the same type of headache and provided her with headache cocktail.  On recheck at 3:50 AM is feeling significantly better and requesting to be discharged home. Normal neurologic exam. Blood pressure still elevated but improving. Plan discharge home with close primary care followup.  Teressa Lower, MD 11/27/11 [phone]

## 2011-11-27 NOTE — ED Notes (Signed)
Pt states understanding of discharge instructions 

## 2011-11-28 ENCOUNTER — Emergency Department (HOSPITAL_COMMUNITY): Payer: Medicare Other

## 2011-11-28 ENCOUNTER — Inpatient Hospital Stay (HOSPITAL_COMMUNITY)
Admission: EM | Admit: 2011-11-28 | Discharge: 2011-12-03 | DRG: 305 | Disposition: A | Discharge status: Home / Self Care | Payer: Medicare Other | Attending: Internal Medicine | Admitting: Internal Medicine

## 2011-11-28 ENCOUNTER — Encounter (HOSPITAL_COMMUNITY): Payer: Self-pay | Admitting: Internal Medicine

## 2011-11-28 DIAGNOSIS — Z79899 Other long term (current) drug therapy: Secondary | ICD-10-CM

## 2011-11-28 DIAGNOSIS — Z7982 Long term (current) use of aspirin: Secondary | ICD-10-CM

## 2011-11-28 DIAGNOSIS — I10 Essential (primary) hypertension: Principal | ICD-10-CM | POA: Diagnosis present

## 2011-11-28 DIAGNOSIS — K219 Gastro-esophageal reflux disease without esophagitis: Secondary | ICD-10-CM | POA: Diagnosis present

## 2011-11-28 DIAGNOSIS — Z833 Family history of diabetes mellitus: Secondary | ICD-10-CM

## 2011-11-28 DIAGNOSIS — Z888 Allergy status to other drugs, medicaments and biological substances status: Secondary | ICD-10-CM

## 2011-11-28 DIAGNOSIS — E669 Obesity, unspecified: Secondary | ICD-10-CM | POA: Diagnosis present

## 2011-11-28 DIAGNOSIS — F329 Major depressive disorder, single episode, unspecified: Secondary | ICD-10-CM | POA: Diagnosis present

## 2011-11-28 DIAGNOSIS — E876 Hypokalemia: Secondary | ICD-10-CM | POA: Diagnosis present

## 2011-11-28 DIAGNOSIS — R51 Headache: Secondary | ICD-10-CM

## 2011-11-28 DIAGNOSIS — E119 Type 2 diabetes mellitus without complications: Secondary | ICD-10-CM | POA: Diagnosis present

## 2011-11-28 DIAGNOSIS — G43909 Migraine, unspecified, not intractable, without status migrainosus: Secondary | ICD-10-CM | POA: Diagnosis present

## 2011-11-28 DIAGNOSIS — R930 Abnormal findings on diagnostic imaging of skull and head, not elsewhere classified: Secondary | ICD-10-CM | POA: Diagnosis present

## 2011-11-28 DIAGNOSIS — E785 Hyperlipidemia, unspecified: Secondary | ICD-10-CM | POA: Diagnosis present

## 2011-11-28 DIAGNOSIS — Z8659 Personal history of other mental and behavioral disorders: Secondary | ICD-10-CM

## 2011-11-28 DIAGNOSIS — F3289 Other specified depressive episodes: Secondary | ICD-10-CM | POA: Diagnosis present

## 2011-11-28 DIAGNOSIS — N179 Acute kidney failure, unspecified: Secondary | ICD-10-CM | POA: Diagnosis present

## 2011-11-28 LAB — BASIC METABOLIC PANEL
BUN: 21 mg/dL (ref 6–23)
Calcium: 10.8 mg/dL — ABNORMAL HIGH (ref 8.4–10.5)
GFR calc Af Amer: 50 mL/min — ABNORMAL LOW (ref >90)
GFR calc non Af Amer: 43 mL/min — ABNORMAL LOW (ref >90)
Potassium: 3.6 mEq/L (ref 3.5–5.1)
Sodium: 135 mEq/L (ref 135–145)

## 2011-11-28 LAB — CBC
MCH: 30 pg (ref 26.0–34.0)
MCHC: 35.4 g/dL (ref 30.0–36.0)
RDW: 12.8 % (ref 11.5–15.5)

## 2011-11-28 MED ORDER — HYDRALAZINE HCL 25 MG PO TABS
25.0000 mg | ORAL_TABLET | Freq: Four times a day (QID) | ORAL | Status: DC
  Administered 2011-11-29 – 2011-12-01 (×10): 25 mg via ORAL
  Filled 2011-11-28 (×14): qty 1

## 2011-11-28 MED ORDER — DIPHENHYDRAMINE HCL 50 MG/ML IJ SOLN
25.0000 mg | Freq: Once | INTRAMUSCULAR | Status: AC
  Administered 2011-11-28: 25 mg via INTRAVENOUS
  Filled 2011-11-28: qty 1

## 2011-11-28 MED ORDER — PANTOPRAZOLE SODIUM 40 MG PO TBEC
40.0000 mg | DELAYED_RELEASE_TABLET | Freq: Every day | ORAL | Status: DC
  Administered 2011-11-29 – 2011-12-03 (×5): 40 mg via ORAL
  Filled 2011-11-28 (×5): qty 1

## 2011-11-28 MED ORDER — SODIUM CHLORIDE 0.9 % IV SOLN
INTRAVENOUS | Status: DC
  Administered 2011-11-28: 20:00:00 via INTRAVENOUS

## 2011-11-28 MED ORDER — PROPRANOLOL HCL 40 MG PO TABS
40.0000 mg | ORAL_TABLET | Freq: Two times a day (BID) | ORAL | Status: DC
  Administered 2011-11-29 – 2011-12-03 (×10): 40 mg via ORAL
  Filled 2011-11-28 (×12): qty 1

## 2011-11-28 MED ORDER — FENOFIBRATE 160 MG PO TABS
160.0000 mg | ORAL_TABLET | Freq: Every day | ORAL | Status: DC
  Administered 2011-11-29 – 2011-12-03 (×5): 160 mg via ORAL
  Filled 2011-11-28 (×6): qty 1

## 2011-11-28 MED ORDER — DOCUSATE SODIUM 100 MG PO CAPS
100.0000 mg | ORAL_CAPSULE | Freq: Every day | ORAL | Status: DC | PRN
  Administered 2011-12-02 – 2011-12-03 (×2): 100 mg via ORAL
  Filled 2011-11-28 (×3): qty 1

## 2011-11-28 MED ORDER — FOLIC ACID 1 MG PO TABS
1.0000 mg | ORAL_TABLET | Freq: Two times a day (BID) | ORAL | Status: DC
  Administered 2011-11-29 – 2011-12-03 (×10): 1 mg via ORAL
  Filled 2011-11-28 (×13): qty 1

## 2011-11-28 MED ORDER — OMEGA-3-ACID ETHYL ESTERS 1 G PO CAPS
2.0000 g | ORAL_CAPSULE | Freq: Every day | ORAL | Status: DC
  Administered 2011-11-29 – 2011-12-03 (×5): 2 g via ORAL
  Filled 2011-11-28 (×6): qty 2

## 2011-11-28 MED ORDER — METOCLOPRAMIDE HCL 5 MG/ML IJ SOLN
10.0000 mg | Freq: Once | INTRAMUSCULAR | Status: AC
  Administered 2011-11-28: 10 mg via INTRAVENOUS
  Filled 2011-11-28: qty 2

## 2011-11-28 MED ORDER — TOPIRAMATE 25 MG PO TABS
50.0000 mg | ORAL_TABLET | Freq: Every day | ORAL | Status: DC
  Administered 2011-11-29 – 2011-12-02 (×4): 50 mg via ORAL
  Filled 2011-11-28 (×5): qty 2

## 2011-11-28 MED ORDER — TRANDOLAPRIL 1 MG PO TABS
1.0000 mg | ORAL_TABLET | Freq: Every day | ORAL | Status: DC
  Administered 2011-11-29 – 2011-12-01 (×3): 1 mg via ORAL
  Filled 2011-11-28 (×4): qty 1

## 2011-11-28 MED ORDER — MORPHINE SULFATE 2 MG/ML IJ SOLN
1.0000 mg | INTRAMUSCULAR | Status: DC | PRN
  Administered 2011-11-29: 1 mg via INTRAVENOUS
  Filled 2011-11-28: qty 1

## 2011-11-28 MED ORDER — FISH OIL 1200 MG PO CAPS: 2.0000 | ORAL_CAPSULE | Freq: Every day | ORAL | Status: DC

## 2011-11-28 MED ORDER — LABETALOL HCL 5 MG/ML IV SOLN
10.0000 mg | INTRAVENOUS | Status: DC | PRN
  Filled 2011-11-28: qty 4

## 2011-11-28 MED ORDER — PERPHENAZINE-AMITRIPTYLINE 2-25 MG PO TABS
1.0000 | ORAL_TABLET | Freq: Every day | ORAL | Status: DC
  Administered 2011-11-29 – 2011-12-03 (×5): 1 via ORAL
  Filled 2011-11-28 (×6): qty 1

## 2011-11-28 MED ORDER — GLIMEPIRIDE 2 MG PO TABS
2.0000 mg | ORAL_TABLET | Freq: Every day | ORAL | Status: DC
  Administered 2011-11-30 – 2011-12-03 (×4): 2 mg via ORAL
  Filled 2011-11-28 (×6): qty 1

## 2011-11-28 MED ORDER — CLONAZEPAM 1 MG PO TABS
1.0000 mg | ORAL_TABLET | Freq: Two times a day (BID) | ORAL | Status: DC
  Administered 2011-11-29 – 2011-12-03 (×10): 1 mg via ORAL
  Filled 2011-11-28 (×11): qty 1

## 2011-11-28 MED ORDER — ATORVASTATIN CALCIUM 20 MG PO TABS
20.0000 mg | ORAL_TABLET | Freq: Every day | ORAL | Status: DC
  Administered 2011-11-29 – 2011-12-01 (×3): 20 mg via ORAL
  Filled 2011-11-28 (×6): qty 1

## 2011-11-28 MED ORDER — FENTANYL CITRATE 0.05 MG/ML IJ SOLN
50.0000 ug | Freq: Once | INTRAMUSCULAR | Status: AC
  Administered 2011-11-28: 50 ug via INTRAVENOUS
  Filled 2011-11-28: qty 2

## 2011-11-28 MED ORDER — BUPROPION HCL ER (XL) 300 MG PO TB24
300.0000 mg | ORAL_TABLET | Freq: Every day | ORAL | Status: DC
  Administered 2011-11-29 – 2011-12-01 (×3): 300 mg via ORAL
  Filled 2011-11-28 (×3): qty 1

## 2011-11-28 MED ORDER — LABETALOL HCL 5 MG/ML IV SOLN
10.0000 mg | Freq: Once | INTRAVENOUS | Status: AC
  Administered 2011-11-28: 10 mg via INTRAVENOUS
  Filled 2011-11-28: qty 4

## 2011-11-28 MED ORDER — LABETALOL HCL 5 MG/ML IV SOLN: 10.0000 mg | INTRAVENOUS | Status: DC | PRN

## 2011-11-28 NOTE — ED Notes (Addendum)
Pt states on the fourth of July she started nauseated. Pt states she nauseated twice today and  Has not taken anything for the nausea. Pt states the headache has started off and on since last Thursday. Pt states her pain level for her headache is at a 9. Pt states she took all of her morning medications this morning including her blood pressure medication

## 2011-11-28 NOTE — ED Notes (Signed)
DR . Lenna Sciara. [facility] - EDP , NOTIFIED ON PT'S ELEVATED BLOOD PRESSURE.

## 2011-11-28 NOTE — ED Notes (Signed)
Hypertensive crisis for a week. No change in bp meds. pcp appt. Tomorrow. Throbbing h/a right now. Sick to stomach.

## 2011-11-28 NOTE — ED Notes (Signed)
Pt to CT

## 2011-11-28 NOTE — ED Notes (Signed)
REPORTS GIVEN TO ERWIN RN AT 81 UNIT , TRANSPORTED IN STABLE CONDITION , NO PAIN OR NAUSEA. IV SITE UNREMARKABLE.

## 2011-11-28 NOTE — H&P (Signed)
Tamara Arnold is an 78 y.o. female.  PCP - Dr.Brent Tollie Pizza.  Chief Complaint: Headache. HPI: 78 year-old female with history of hypertension and diabetes mellitus type 2 and migraine headaches has come to the ER 3 days ago with headache and was discharged after getting pain relief medications presents back with complaints of persistent headache. Patient has been having headaches for last 30 years since patient had complete hysterectomy with bilateral salpingo-oophorectomy. She has been to multiple providers previously. She has been admitted last year to our facility for similar complaints. At that time discharging M.D. had recommended to be off Wellbutrin which patient is still on. This episode started 3 days ago with nausea and throbbing headache. The headache is mostly frontal. Patient also has nausea with epigastric discomfort. She also experienced some left foot numbness. Denies any focal deficits dizziness or loss of consciousness. Denies any blurred vision. Denies chest pain or shortness of breath. In the ER patient was found to have high blood pressure. Labetalol was given IV. Her blood pressure improved along with that a headache which was 10 out of 10 intensity has reduced to 7. She also had received doses of IV pain relief medications along with metoclopramide. Patient has been admitted for observation for her blood pressure management and headache. Patient says that since she had this headache episode she was not able to take anything by mouth but was able to take her regular home medications.  Past Medical History  Diagnosis Date  . Hypertension   . Diabetes mellitus   . Migraine   . Reflux   . Hyperlipidemia     Past Surgical History  Procedure Date  . Cholecystectomy   . Abdominal hysterectomy   . Appendectomy   . Hernia repair   . Cystectomy   . Cataract extraction     Family History  Problem Relation Age of Onset  . Diabetes type II Father    Social History:  reports  that she has never smoked. She does not have any smokeless tobacco history on file. She reports that she does not drink alcohol or use illicit drugs.  Allergies:  Allergies  Allergen Reactions  . Prednisone Other (See Comments)    Headaches-all steroids  . Wellbutrin (Bupropion Hcl) Other (See Comments)    Caused headaches     (Not in a hospital admission)  Results for orders placed during the hospital encounter of 11/28/11 (from the past 48 hour(s))  CBC     Status: Abnormal   Collection Time   11/28/11  7:25 PM      Component Value Range Comment   WBC 7.1  4.0 - 10.5 K/uL    RBC 3.73 (*) 3.87 - 5.11 MIL/uL    Hemoglobin 11.2 (*) 12.0 - 15.0 g/dL    HCT 31.6 (*) 36.0 - 46.0 %    MCV 84.7  78.0 - 100.0 fL    MCH 30.0  26.0 - 34.0 pg    MCHC 35.4  30.0 - 36.0 g/dL    RDW 12.8  11.5 - 15.5 %    Platelets 241  150 - 400 K/uL   BASIC METABOLIC PANEL     Status: Abnormal   Collection Time   11/28/11  7:25 PM      Component Value Range Comment   Sodium 135  135 - 145 mEq/L    Potassium 3.6  3.5 - 5.1 mEq/L    Chloride 95 (*) 96 - 112 mEq/L    CO2 29  19 - 32 mEq/L    Glucose, Bld 189 (*) 70 - 99 mg/dL    BUN 21  6 - 23 mg/dL    Creatinine, Ser 1.13 (*) 0.50 - 1.10 mg/dL    Calcium 10.8 (*) 8.4 - 10.5 mg/dL    GFR calc non Af Amer 43 (*) >90 mL/min    GFR calc Af Amer 50 (*) >90 mL/min    Ct Head Wo Contrast  11/28/2011  *RADIOLOGY REPORT*  Clinical Data: Hypertension with headaches  CT HEAD WITHOUT CONTRAST  Technique:  Contiguous axial images were obtained from the base of the skull through the vertex without contrast.  Comparison: 11/11/2010  Findings: There is no evidence for acute infarction, intracranial hemorrhage, mass lesion, hydrocephalus, or extra-axial fluid. There is mild atrophy with chronic microvascular ischemic change. Vascular calcification is noted.  Moderate hyperostosis is seen without skull fracture or osseous destructive lesion.  There is no acute sinus or  mastoid disease.  The appearance is similar compared with priors.  IMPRESSION: Age related atrophy and chronic microvascular ischemic change.  No intracranial hemorrhage or acute infarction.  Similar appearance to priors.  Original Report Authenticated By: Staci Righter, M.D.    Review of Systems  Constitutional: Negative.   Respiratory: Negative.   Cardiovascular: Negative.   Gastrointestinal: Positive for nausea.  Genitourinary: Negative.   Musculoskeletal: Negative.   Skin: Negative.   Neurological: Positive for headaches.  Psychiatric/Behavioral: Negative.     Blood pressure 181/65, pulse 70, temperature 98.1 F (36.7 C), temperature source Oral, resp. rate 14, SpO2 97.00%. Physical Exam  Constitutional: She is oriented to person, place, and time. She appears well-developed and well-nourished. No distress.  HENT:  Head: Normocephalic and atraumatic.  Right Ear: External ear normal.  Left Ear: External ear normal.  Nose: Nose normal.  Mouth/Throat: Oropharynx is clear and moist. No oropharyngeal exudate.  Eyes: Conjunctivae are normal. Pupils are equal, round, and reactive to light. Right eye exhibits no discharge. Left eye exhibits no discharge. No scleral icterus.  Neck: Normal range of motion. Neck supple.  Cardiovascular: Normal rate and regular rhythm.   Respiratory: Effort normal and breath sounds normal. No respiratory distress. She has no wheezes. She has no rales.  GI: Soft. Bowel sounds are normal.  Musculoskeletal: Normal range of motion. She exhibits no edema and no tenderness.  Neurological: She is alert and oriented to person, place, and time.       Moves all extremities 5/5. No facial asymmetry.  Skin: Skin is warm and dry. No rash noted. She is not diaphoretic. No erythema.  Psychiatric: Her behavior is normal.     Assessment/Plan #1. Headache with history of migraine and at this time presenting with accelerated hypertension - continue with when necessary IV  labetalol for systolic blood pressure more than 160 with her regular home medications. I think patient probably has an episode of a migraine headache which has been further worsened because of uncontrolled hypertension. Patient states her grandson is coming to stay with her along with his 7 children which his stressing her. Probably this could be the trigger for her migraines. At this time we will observe her for next 24 hours specifically to control blood pressure and pain relief. Since patient is complaining of left foot numbness I have ordered MRI of the brain. Until then patient will be on neurochecks. Patient also has a sedimentation rate ordered. #2. Diabetes mellitus2 - continue home medications with sliding scale coverage and close followup of  CBGs. #3. History of depression - continue home medications. #4. History of hyperlipidemia - continue home medications.  CODE STATUS - full code.  Rise Patience. 11/28/2011, 10:26 PM

## 2011-11-28 NOTE — ED Provider Notes (Signed)
History     CSN: [id number]  Arrival date & time 11/28/11  1830   First MD Initiated Contact with Patient 11/28/11 1913      Chief Complaint  Patient presents with  . Hypertension     HPI Pt has history of migraine headaches.  This episode started on Friday.  She was treated with medications in the ED on Friday.  She felt better after treatment in the ED but the headache returned over the weekend.  She has had nausea and has not been able to eat because of it.  No abdominal pain.  No chest pain.  No diarrhea.  NO fever.  The headache is severe.  Light makes it worse but nothing seems to help.  She has an appointment with her doctor tomorrow but could not wait.  Past Medical History  Diagnosis Date  . Hypertension   . Diabetes mellitus   . Migraine   . Reflux   . Hyperlipidemia     Past Surgical History  Procedure Date  . Cholecystectomy   . Abdominal hysterectomy   . Appendectomy   . Hernia repair   . Cystectomy   . Cataract extraction     No family history on file.  History  Substance Use Topics  . Smoking status: Never Smoker   . Smokeless tobacco: Not on file  . Alcohol Use: No    OB History    Grav Para Term Preterm Abortions TAB SAB Ect Mult Living                  Review of Systems  All other systems reviewed and are negative.    Allergies  Prednisone and Wellbutrin  Home Medications   Current Outpatient Rx  Name Route Sig Dispense Refill  . ASCORBIC ACID 500 MG PO TABS Oral Take 500 mg by mouth daily.      . ASPIRIN 81 MG PO CHEW Oral Chew 81 mg by mouth daily.      . OCUVITE PO TABS Oral Take 1 tablet by mouth 2 (two) times daily.     . BUPROPION HCL ER (XL) 300 MG PO TB24 Oral Take 300 mg by mouth daily.    Marland Kitchen CLONAZEPAM 1 MG PO TABS Oral Take 1 mg by mouth 2 (two) times daily. For anxiety    . DOCUSATE SODIUM 100 MG PO CAPS Oral Take 100 mg by mouth daily as needed. For constipation    . FENOFIBRATE 145 MG PO TABS Oral Take 145 mg by mouth  daily.    . FENOFIBRATE 160 MG PO TABS Oral Take 160 mg by mouth daily.    Marland Kitchen FOLIC ACID 1 MG PO TABS Oral Take 1 mg by mouth 2 (two) times daily.      Marland Kitchen GLIMEPIRIDE 2 MG PO TABS Oral Take 2 mg by mouth daily before breakfast.      . HYDRALAZINE HCL 25 MG PO TABS Oral Take 25 mg by mouth 4 (four) times daily.      . MELOXICAM 15 MG PO TABS Oral Take 15 mg by mouth daily.    Marland Kitchen MOEXIPRIL HCL 7.5 MG PO TABS Oral Take 7.5 mg by mouth at bedtime.      Creed Copper M PLUS PO TABS Oral Take 1 tablet by mouth daily.      Marland Kitchen FISH OIL 1200 MG PO CAPS Oral Take 2 capsules by mouth daily.    Marland Kitchen OMEPRAZOLE 20 MG PO CPDR Oral Take 20 mg  by mouth daily.      Marland Kitchen OVER THE COUNTER MEDICATION Oral Take 1 capsule by mouth daily as needed. Papaya enzyme    . PERPHENAZINE 4 MG PO TABS Oral Take 4 mg by mouth daily.      Marland Kitchen PERPHENAZINE-AMITRIPTYLINE 2-25 MG PO TABS Oral Take 1 tablet by mouth daily.      Marland Kitchen PROPRANOLOL HCL 40 MG PO TABS Oral Take 40 mg by mouth 2 (two) times daily.    Marland Kitchen ROSUVASTATIN CALCIUM 10 MG PO TABS Oral Take 5 mg by mouth daily.     . TOPIRAMATE 25 MG PO CPSP Oral Take 50 mg by mouth at bedtime.     Marland Kitchen VITAMIN C 500 MG PO TABS Oral Take 500 mg by mouth daily.      Marland Kitchen VITAMIN E 400 UNITS PO CAPS Oral Take 400 Units by mouth daily.        BP 193/76  Pulse 72  Temp 98.6 F (37 C)  Resp 18  SpO2 97%  Physical Exam  Nursing note and vitals reviewed. Constitutional: She is oriented to person, place, and time. She appears well-developed and well-nourished. No distress.  HENT:  Head: Normocephalic and atraumatic.  Right Ear: External ear normal.  Left Ear: External ear normal.  Mouth/Throat: Oropharynx is clear and moist.  Eyes: Conjunctivae are normal. Right eye exhibits no discharge. Left eye exhibits no discharge. No scleral icterus.  Neck: Neck supple. No tracheal deviation present.  Cardiovascular: Normal rate, regular rhythm and intact distal pulses.   Pulmonary/Chest: Effort normal and  breath sounds normal. No stridor. No respiratory distress. She has no wheezes. She has no rales.  Abdominal: Soft. Bowel sounds are normal. She exhibits no distension. There is no tenderness. There is no rebound and no guarding.  Musculoskeletal: She exhibits no edema and no tenderness.  Neurological: She is alert and oriented to person, place, and time. She has normal strength. No cranial nerve deficit ( no gross defecits noted) or sensory deficit. She exhibits normal muscle tone. She displays no seizure activity. Coordination normal.       No pronator drift bilateral upper extrem, able to hold both legs off bed for 5 seconds, sensation intact in all extremities, no visual field cuts, no left or right sided neglect  Skin: Skin is warm and dry. No rash noted.  Psychiatric: She has a normal mood and affect.    ED Course  Procedures (including critical care time)  Labs Reviewed  CBC - Abnormal; Notable for the following:    RBC 3.73 (*)     Hemoglobin 11.2 (*)     HCT 31.6 (*)     All other components within normal limits  BASIC METABOLIC PANEL - Abnormal; Notable for the following:    Chloride 95 (*)     Glucose, Bld 189 (*)     Creatinine, Ser 1.13 (*)     Calcium 10.8 (*)     GFR calc non Af Amer 43 (*)     GFR calc Af Amer 50 (*)     All other components within normal limits   Ct Head Wo Contrast  11/28/2011  *RADIOLOGY REPORT*  Clinical Data: Hypertension with headaches  CT HEAD WITHOUT CONTRAST  Technique:  Contiguous axial images were obtained from the base of the skull through the vertex without contrast.  Comparison: 11/11/2010  Findings: There is no evidence for acute infarction, intracranial hemorrhage, mass lesion, hydrocephalus, or extra-axial fluid. There is mild atrophy  with chronic microvascular ischemic change. Vascular calcification is noted.  Moderate hyperostosis is seen without skull fracture or osseous destructive lesion.  There is no acute sinus or mastoid disease.   The appearance is similar compared with priors.  IMPRESSION: Age related atrophy and chronic microvascular ischemic change.  No intracranial hemorrhage or acute infarction.  Similar appearance to priors.  Original Report Authenticated By: Staci Righter, M.D.     MDM  Patient persists with her headache, nausea and hypertension. CT scan does not show evidence of cerebral edema. She has no focal neurologic symptoms I doubt hypertensive crisis. I suspect her symptoms are related to a migraine headache however she continues to have pain and discomfort. Patient has not been tolerating by mouth and this is her second visit to the emergency department for this issue. We'll consult the hospitalist for continued pain management stabilization of her blood pressure and treatment.        Kathalene Frames, MD 11/28/11 2118

## 2011-11-29 ENCOUNTER — Observation Stay (HOSPITAL_COMMUNITY): Payer: Medicare Other

## 2011-11-29 ENCOUNTER — Encounter (HOSPITAL_COMMUNITY): Payer: Self-pay

## 2011-11-29 DIAGNOSIS — R930 Abnormal findings on diagnostic imaging of skull and head, not elsewhere classified: Secondary | ICD-10-CM | POA: Diagnosis present

## 2011-11-29 DIAGNOSIS — E876 Hypokalemia: Secondary | ICD-10-CM

## 2011-11-29 LAB — GLUCOSE, CAPILLARY
Glucose-Capillary: 183 mg/dL — ABNORMAL HIGH (ref 70–99)
Glucose-Capillary: 88 mg/dL (ref 70–99)
Glucose-Capillary: 96 mg/dL (ref 70–99)

## 2011-11-29 LAB — RAPID URINE DRUG SCREEN, HOSP PERFORMED
Amphetamines: NOT DETECTED
Barbiturates: NOT DETECTED
Benzodiazepines: NOT DETECTED
Cocaine: NOT DETECTED

## 2011-11-29 LAB — COMPREHENSIVE METABOLIC PANEL
ALT: 12 U/L (ref 0–35)
AST: 20 U/L (ref 0–37)
Albumin: 3.3 g/dL — ABNORMAL LOW (ref 3.5–5.2)
Alkaline Phosphatase: 33 U/L — ABNORMAL LOW (ref 39–117)
Glucose, Bld: 95 mg/dL (ref 70–99)
Potassium: 3.3 mEq/L — ABNORMAL LOW (ref 3.5–5.1)
Sodium: 138 mEq/L (ref 135–145)
Total Protein: 6.4 g/dL (ref 6.0–8.3)

## 2011-11-29 LAB — CBC
HCT: 31.1 % — ABNORMAL LOW (ref 36.0–46.0)
Hemoglobin: 10.6 g/dL — ABNORMAL LOW (ref 12.0–15.0)
MCH: 29.1 pg (ref 26.0–34.0)
MCHC: 34.1 g/dL (ref 30.0–36.0)
MCV: 85.4 fL (ref 78.0–100.0)
RDW: 13 % (ref 11.5–15.5)

## 2011-11-29 LAB — LIPID PANEL: LDL Cholesterol: 40 mg/dL (ref 0–99)

## 2011-11-29 MED ORDER — AMLODIPINE BESYLATE 5 MG PO TABS
5.0000 mg | ORAL_TABLET | Freq: Every day | ORAL | Status: DC
  Administered 2011-11-29 – 2011-11-30 (×2): 5 mg via ORAL
  Filled 2011-11-29 (×3): qty 1

## 2011-11-29 MED ORDER — INSULIN ASPART 100 UNIT/ML ~~LOC~~ SOLN
0.0000 [IU] | Freq: Every day | SUBCUTANEOUS | Status: DC
Start: 2011-11-29 — End: 2011-12-03

## 2011-11-29 MED ORDER — POTASSIUM CHLORIDE CRYS ER 20 MEQ PO TBCR
40.0000 meq | EXTENDED_RELEASE_TABLET | ORAL | Status: AC
  Administered 2011-11-29 (×2): 40 meq via ORAL
  Filled 2011-11-29 (×2): qty 2

## 2011-11-29 MED ORDER — INSULIN ASPART 100 UNIT/ML ~~LOC~~ SOLN
0.0000 [IU] | Freq: Three times a day (TID) | SUBCUTANEOUS | Status: DC
  Administered 2011-11-29: 1 [IU] via SUBCUTANEOUS
  Administered 2011-11-30 – 2011-12-01 (×2): 2 [IU] via SUBCUTANEOUS
  Administered 2011-12-02 (×2): 1 [IU] via SUBCUTANEOUS
  Administered 2011-12-03: 3 [IU] via SUBCUTANEOUS
  Administered 2011-12-03: 1 [IU] via SUBCUTANEOUS

## 2011-11-29 MED ORDER — ONDANSETRON HCL 4 MG/2ML IJ SOLN
4.0000 mg | Freq: Four times a day (QID) | INTRAMUSCULAR | Status: DC | PRN
  Administered 2011-11-29: 4 mg via INTRAVENOUS
  Filled 2011-11-29: qty 2

## 2011-11-29 NOTE — Progress Notes (Signed)
Received a newly admitted patient, came to the unit per stretcher awake, conscious and coherent, not in any form of distress. Reported feeling nauseous and having moderate throbbing headache. Placed on her bed, oriented to her room. Safety and comfort measures provided. Will continue to monitor.

## 2011-11-29 NOTE — Progress Notes (Signed)
Utilization review completed.  

## 2011-11-29 NOTE — Consult Note (Signed)
Reason for Consult: Headache Referring Physician: [facility]  CC: Headache.  HPI: Tamara Arnold is an 78 y.o. female who has had migraines for over 30 years after TAH-BSO, developed an intense frontal headache 4 days ago. She says that it was 9/10 in intensity but did resemble her previous migraine pattern. She denies other symptoms except for photosensitivity and nausea. Chart review does indicate that she complained of left foot numbness at admission which she states has resolved.  ED Physician note shows an elevated BP of 220/60, she was treated with labetalol. She does have a history of hypertension. She has a long list of medications in her pocketbook. She has taken Wellbutrin in the past, but says that it was stopped and it "did not make a difference". It is listed as an allergy, although she could not tell me why. In addition, propanolol is on the list; however she states that she takes her meds but does not know what works on her headaches.  Her MRI and CT revealed a small extraaxial cystic structure medially and supratentorially on the left, with contents resembling CSF signal characteristics on all MRI pulse sequences and with attenuation characteristics similar to CSF on CT. Adjacent to the cyst and also extraaxial is a small extraaxial irregular tissue signal versus hemorrhage or proteinaceous fluid collection without associated mass effect or adjacent cerebral edema.    Past Medical History  Diagnosis Date  . Hypertension   . Diabetes mellitus   . Migraine   . Reflux   . Hyperlipidemia     Past Surgical History  Procedure Date  . Cholecystectomy   . Abdominal hysterectomy   . Appendectomy   . Hernia repair   . Cystectomy   . Cataract extraction     Family History  Problem Relation Age of Onset  . Diabetes type II Father     Social History:  reports that she has never smoked. She does not have any smokeless tobacco history on file. She reports that she  does not drink alcohol or use illicit drugs.  Allergies  Allergen Reactions  . Prednisone Other (See Comments)    Headaches-all steroids  . Wellbutrin (Bupropion Hcl) Other (See Comments)    Caused headaches    Medications:  Prior to Admission:  Prescriptions prior to admission  Medication Sig Dispense Refill  . ascorbic acid (VITAMIN C) 500 MG tablet Take 500 mg by mouth daily.        Marland Kitchen aspirin 81 MG chewable tablet Chew 81 mg by mouth daily.        . beta carotene w/minerals (OCUVITE) tablet Take 1 tablet by mouth 2 (two) times daily.       Marland Kitchen buPROPion (WELLBUTRIN XL) 300 MG 24 hr tablet Take 300 mg by mouth daily.      . clonazePAM (KLONOPIN) 1 MG tablet Take 1 mg by mouth 2 (two) times daily. For anxiety      . docusate sodium (COLACE) 100 MG capsule Take 100 mg by mouth daily as needed. For constipation      . fenofibrate (TRICOR) 145 MG tablet Take 145 mg by mouth daily.      . fenofibrate 160 MG tablet Take 160 mg by mouth daily.      . folic acid (FOLVITE) 1 MG tablet Take 1 mg by mouth 2 (two) times daily.        Marland Kitchen glimepiride (AMARYL) 2 MG tablet Take 2 mg by mouth daily before breakfast.        .  hydrALAZINE (APRESOLINE) 25 MG tablet Take 25 mg by mouth 4 (four) times daily.        . meloxicam (MOBIC) 15 MG tablet Take 15 mg by mouth daily.      . moexipril (UNIVASC) 7.5 MG tablet Take 7.5 mg by mouth at bedtime.        . Multiple Vitamins-Minerals (MULTIVITAMINS THER. W/MINERALS) TABS Take 1 tablet by mouth daily.        . Omega-3 Fatty Acids (FISH OIL) 1200 MG CAPS Take 2 capsules by mouth daily.      Marland Kitchen omeprazole (PRILOSEC) 20 MG capsule Take 20 mg by mouth daily.        Marland Kitchen OVER THE COUNTER MEDICATION Take 1 capsule by mouth daily as needed. Papaya enzyme      . perphenazine (TRILAFON) 4 MG tablet Take 4 mg by mouth daily.        Marland Kitchen perphenazine-amitriptyline (ETRAFON/TRIAVIL) 2-25 MG TABS Take 1 tablet by mouth daily.        . propranolol (INDERAL) 40 MG tablet Take 40  mg by mouth 2 (two) times daily.      . rosuvastatin (CRESTOR) 10 MG tablet Take 5 mg by mouth daily.       Marland Kitchen topiramate (TOPAMAX) 25 MG capsule Take 50 mg by mouth at bedtime.       . vitamin C (ASCORBIC ACID) 500 MG tablet Take 500 mg by mouth daily.        . vitamin E (VITAMIN E) 400 UNIT capsule Take 400 Units by mouth daily.         Scheduled:   . amLODipine  5 mg Oral Daily  . atorvastatin  20 mg Oral q1800  . buPROPion  300 mg Oral Daily  . clonazePAM  1 mg Oral BID  . diphenhydrAMINE  25 mg Intravenous Once  . fenofibrate  160 mg Oral Daily  . fentaNYL  50 mcg Intravenous Once  . fentaNYL  50 mcg Intravenous Once  . folic acid  1 mg Oral BID  . glimepiride  2 mg Oral QAC breakfast  . hydrALAZINE  25 mg Oral QID  . insulin aspart  0-5 Units Subcutaneous QHS  . insulin aspart  0-9 Units Subcutaneous TID WC  . labetalol  10 mg Intravenous Once  . metoCLOPramide (REGLAN) injection  10 mg Intravenous Once  . omega-3 acid ethyl esters  2 g Oral Q breakfast  . pantoprazole  40 mg Oral Q1200  . perphenazine-amitriptyline  1 tablet Oral Daily  . potassium chloride  40 mEq Oral Q2H  . propranolol  40 mg Oral BID  . topiramate  50 mg Oral QHS  . trandolapril  1 mg Oral Daily  . DISCONTD: Fish Oil  2 capsule Oral Daily    ROS: History obtained from spouse, chart review and the patient  General ROS: negative for - chills, fatigue, fever, night sweats, weight gain or weight loss Psychological ROS: negative for - behavioral disorder, hallucinations, memory difficulties, mood swings or suicidal ideation Ophthalmic ROS: negative for - blurry vision, double vision, eye pain or loss of vision ENT ROS: negative for - epistaxis, nasal discharge, oral lesions, sore throat, tinnitus or vertigo Allergy and Immunology ROS: negative for - hives or itchy/watery eyes Hematological and Lymphatic ROS: negative for - bleeding problems, bruising or swollen lymph nodes Endocrine ROS: negative for -  galactorrhea, hair pattern changes, polydipsia/polyuria or temperature intolerance Respiratory ROS: negative for - cough, hemoptysis, shortness of breath or  wheezing Cardiovascular ROS: negative for - chest pain, dyspnea on exertion, edema or irregular heartbeat Gastrointestinal ROS: negative for - abdominal pain, diarrhea, hematemesis, nausea/vomiting or stool incontinence Genito-Urinary ROS: negative for - dysuria, hematuria, incontinence or urinary frequency/urgency Musculoskeletal ROS: negative for - joint swelling or muscular weakness Neurological ROS: as noted in HPI Dermatological ROS: negative for rash and skin lesion changes   Physical Examination: Blood pressure 163/67, pulse 69, temperature 97.8 F (36.6 C), temperature source Oral, resp. rate 18, weight 70.3 kg (154 lb 15.7 oz), SpO2 92.00%.  Neurologic Examination Mental Status: Alert, oriented, thought content appropriate.  Speech fluent without evidence of aphasia.  Able to follow 3 step commands without difficulty. Cranial Nerves: II: visual fields grossly normal, pupils equal, round, reactive to light and accommodation III,IV, VI: ptosis not present, extra-ocular movements intact bilaterally V,VII: smile symmetric, facial light touch sensation normal bilaterally VIII: hearing normal bilaterally IX,X: gag reflex present XI: trapezius strength/neck flexion strength normal bilaterally XII: tongue midline  Motor: Right : Upper extremity   5/5    Left:     Upper extremity   5/5  Lower extremity   5/5     Lower extremity   5/5 Tone and bulk:normal tone throughout; mild diffuse atrophy noted. Sensory: Pinprick and light touch intact throughout, bilaterally Deep Tendon Reflexes: 2+ and symmetric throughout. Right toe: downgoing. Left toe: downgoing Cerebellar: Normal finger-to-nose, normal rapid alternating movements. Gait: Deferred.   Results for orders placed during the hospital encounter of 11/28/11 (from the past 48  hour(s))  CBC     Status: Abnormal   Collection Time   11/28/11  7:25 PM      Component Value Range Comment   WBC 7.1  4.0 - 10.5 K/uL    RBC 3.73 (*) 3.87 - 5.11 MIL/uL    Hemoglobin 11.2 (*) 12.0 - 15.0 g/dL    HCT 31.6 (*) 36.0 - 46.0 %    MCV 84.7  78.0 - 100.0 fL    MCH 30.0  26.0 - 34.0 pg    MCHC 35.4  30.0 - 36.0 g/dL    RDW 12.8  11.5 - 15.5 %    Platelets 241  150 - 400 K/uL   BASIC METABOLIC PANEL     Status: Abnormal   Collection Time   11/28/11  7:25 PM      Component Value Range Comment   Sodium 135  135 - 145 mEq/L    Potassium 3.6  3.5 - 5.1 mEq/L    Chloride 95 (*) 96 - 112 mEq/L    CO2 29  19 - 32 mEq/L    Glucose, Bld 189 (*) 70 - 99 mg/dL    BUN 21  6 - 23 mg/dL    Creatinine, Ser 1.13 (*) 0.50 - 1.10 mg/dL    Calcium 10.8 (*) 8.4 - 10.5 mg/dL    GFR calc non Af Amer 43 (*) >90 mL/min    GFR calc Af Amer 50 (*) >90 mL/min   GLUCOSE, CAPILLARY     Status: Abnormal   Collection Time   11/28/11 11:58 PM      Component Value Range Comment   Glucose-Capillary 183 (*) 70 - 99 mg/dL   SEDIMENTATION RATE     Status: Normal   Collection Time   11/29/11 12:28 AM      Component Value Range Comment   Sed Rate 10  0 - 22 mm/hr   HEMOGLOBIN A1C     Status: Abnormal  Collection Time   11/29/11 12:28 AM      Component Value Range Comment   Hemoglobin A1C 6.4 (*) <5.7 %    Mean Plasma Glucose 137 (*) <117 mg/dL   URINE RAPID DRUG SCREEN (HOSP PERFORMED)     Status: Normal   Collection Time   11/29/11  1:04 AM      Component Value Range Comment   Opiates NONE DETECTED  NONE DETECTED    Cocaine NONE DETECTED  NONE DETECTED    Benzodiazepines NONE DETECTED  NONE DETECTED    Amphetamines NONE DETECTED  NONE DETECTED    Tetrahydrocannabinol NONE DETECTED  NONE DETECTED    Barbiturates NONE DETECTED  NONE DETECTED   COMPREHENSIVE METABOLIC PANEL     Status: Abnormal   Collection Time   11/29/11  7:36 AM      Component Value Range Comment   Sodium 138  135 - 145 mEq/L     Potassium 3.3 (*) 3.5 - 5.1 mEq/L    Chloride 97  96 - 112 mEq/L    CO2 29  19 - 32 mEq/L    Glucose, Bld 95  70 - 99 mg/dL    BUN 25 (*) 6 - 23 mg/dL    Creatinine, Ser 1.44 (*) 0.50 - 1.10 mg/dL    Calcium 10.5  8.4 - 10.5 mg/dL    Total Protein 6.4  6.0 - 8.3 g/dL    Albumin 3.3 (*) 3.5 - 5.2 g/dL    AST 20  0 - 37 U/L    ALT 12  0 - 35 U/L    Alkaline Phosphatase 33 (*) 39 - 117 U/L    Total Bilirubin 0.3  0.3 - 1.2 mg/dL    GFR calc non Af Amer 32 (*) >90 mL/min    GFR calc Af Amer 37 (*) >90 mL/min   CBC     Status: Abnormal   Collection Time   11/29/11  7:36 AM      Component Value Range Comment   WBC 8.9  4.0 - 10.5 K/uL    RBC 3.64 (*) 3.87 - 5.11 MIL/uL    Hemoglobin 10.6 (*) 12.0 - 15.0 g/dL    HCT 31.1 (*) 36.0 - 46.0 %    MCV 85.4  78.0 - 100.0 fL    MCH 29.1  26.0 - 34.0 pg    MCHC 34.1  30.0 - 36.0 g/dL    RDW 13.0  11.5 - 15.5 %    Platelets 233  150 - 400 K/uL   LIPID PANEL     Status: Normal   Collection Time   11/29/11  7:36 AM      Component Value Range Comment   Cholesterol 131  0 - 200 mg/dL    Triglycerides 91  <150 mg/dL    HDL 73  >39 mg/dL    Total CHOL/HDL Ratio 1.8      VLDL 18  0 - 40 mg/dL    LDL Cholesterol 40  0 - 99 mg/dL   GLUCOSE, CAPILLARY     Status: Normal   Collection Time   11/29/11  8:02 AM      Component Value Range Comment   Glucose-Capillary 88  70 - 99 mg/dL    Comment 1 Documented in Chart      Comment 2 Notify RN     GLUCOSE, CAPILLARY     Status: Abnormal   Collection Time   11/29/11  1:10 PM  Component Value Range Comment   Glucose-Capillary 140 (*) 70 - 99 mg/dL    Comment 1 Documented in Chart      Comment 2 Notify RN     GLUCOSE, CAPILLARY     Status: Normal   Collection Time   11/29/11  5:16 PM      Component Value Range Comment   Glucose-Capillary 96  70 - 99 mg/dL    Comment 1 Documented in Chart      Comment 2 Notify RN       Ct Head Wo Contrast 11/28/2011  No intracranial hemorrhage or acute  infarction.  Similar appearance to priors.    Mri Brain Without Contrast  11/29/2011  **ADDENDUM** Addendum should have stated that the FLAIR altered signal intensity is within the medial right parietal sulcus (and not within the parietal lobe).  Nonspecific 1 cm cystic structure is within the right parietal lobe and can be evaluated on follow-up MR in 6 months.  Results discussed with Dr. Zigmund Daniel.  **END ADDENDUM** SIGNED BY: Doran Heater. Jeannine Kitten, M.D.   11/29/2011  **ADDENDUM** CREATED: 11/29/2011 12:21:17  The patient is not able to receive contrast secondary to poor renal function.  Coronal FLAIR imaging was obtained which confirms that the abnormality within the sulci of the medial right parietal lobe is a true finding rather than representing artifact.  This may represent subacute in subarachnoid blood or proteinaceous material.  It is difficult to make a connection with the right parietal intra-axial 1 cm cystic structure.  This latter structure can be evaluated on follow-up imaging to confirm stability.  To exclude possibility of subarachnoid blood or meningitis correlation with cerebrospinal fluid analysis would be necessary.  Cortical vein thrombosis contributing to the subarachnoid blood is a consideration.  Results called to Dr. Zigmund Daniel 11/29/2011 12:30 p.m.  **END ADDENDUM** SIGNED BY: Doran Heater. Jeannine Kitten, M.D.   11/29/2011  *RADIOLOGY REPORT*  No acute infarct.  In the medial superior right parietal lobe there is a 1 cm nonspecific cystic-appearing structure.  .   Mr Jodene Nam Abdomen Wo Contrast 11/29/2011  *RADIOLOGY REPORT* : No evidence for renal artery stenosis.  Incidentally, the main right renal artery is low lying and there appears to be a superior right accessory renal artery.  Probable renal and hepatic cysts.     Assessment/Plan:  Assessment:  1. Frontal persistent headache of varying intensity over 4 days. History of migraines. Neurological exam nonlateralizing and nonfocal.  2. Associated  small cystic intracranial MRI and CT abnormality, as described above, is of uncertain etiology and may be unrelated to the patient's headaches. This lesion may be benign given lack of mass effect or adjacent cerebral edema.    Recommendations:  1. Consider trial of methocarbamol for 2-3 days to determine if there is a muscle spasm component to her intractable headache. Of note, elderly patients may be more sensitive to the effects of this medication than younger patients.  2. Lumbar puncture to assess for xanthochromia, elevated protein or abnormal nucleated cells.  3. Close outpatient clinical neurological follow up, with repeat MRI of brain in 4-6 weeks.   Patient seen and examined with Dr. Kerney Elbe.   Wayland Denis, MBA, MHA [facility] Pager [phone]  11/29/2011, 5:40 PM   Electronically signed: Dr. Kerney Elbe

## 2011-11-29 NOTE — Progress Notes (Signed)
Subjective: Pt states that her headache is throbbing in nature and is now  a 4/10 as compared to 9/10 at admission. She states that she has had migrane HA for > 30 years and had been seen by [facility] headache clinic in [place] > 20 yrs ago. She states that she has over the years had headaches severe enough to require hospitalization on several occasions. Pt  also complains of dysthesias in LLE.  Interval History: Patient had MRI this morning. I spoke with Dr. Jeannine Kitten who reports findings that my represent pathology but requires an MRI with contrast to further evaluate. MRI with contrast ordered. Objective: Filed Vitals:   11/28/11 2301 11/29/11 0010 11/29/11 0437 11/29/11 0925  BP: 172/47 169/79 126/54 189/67  Pulse: 72 76 63 78  Temp:  98.5 F (36.9 C) 97.6 F (36.4 C) 98 F (36.7 C)  TempSrc:  Oral Oral Oral  Resp: 14 16 17 17   Weight:  70.3 kg (154 lb 15.7 oz)    SpO2: 97% 97% 95% 92%   Weight change:   Intake/Output Summary (Last 24 hours) at 11/29/11 1001 Last data filed at 11/29/11 0928  Gross per 24 hour  Intake     80 ml  Output    250 ml  Net   -170 ml    General: Alert, awake, oriented x3, in no acute distress.  HEENT: [place]/AT PEERL, EOMI Neck: Trachea midline,  no masses, no thyromegal,y no JVD, no carotid bruit OROPHARYNX:  Moist, No exudate/ erythema/lesions.  Heart: Regular rate and rhythm, without murmurs, rubs, gallops, PMI non-displaced, no heaves or thrills on palpation.  Lungs: Clear to auscultation, no wheezing or rhonchi noted. No increased vocal fremitus resonant to percussion  Abdomen: Soft, nontender, nondistended, positive bowel sounds, no masses no hepatosplenomegaly noted..  Neuro: No focal neurological deficits noted cranial nerves II through XII grossly intact. DTRs 2+ bilaterally upper and lower extremities. Strength functional and equal in bilateral upper and lower extremities. Pt has sensation intact to light tough, PP and proprioception in all peripheral  dermatomes and specifically the left foot and leg. Musculoskeletal: No warm swelling or erythema around joints, no spinal tenderness noted. Psychiatric: Patient alert and oriented x3, good insight and cognition, good recent to remote recall.  Lab Results:  Basename 11/28/11 1925 11/26/11 2100  NA 135 135  K 3.6 3.9  CL 95* 97  CO2 29 28  GLUCOSE 189* 188*  BUN 21 19  CREATININE 1.13* 1.06  CALCIUM 10.8* 10.5  MG -- --  PHOS -- --   No results found for this basename: AST:2,ALT:2,ALKPHOS:2,BILITOT:2,PROT:2,ALBUMIN:2 in the last 72 hours No results found for this basename: LIPASE:2,AMYLASE:2 in the last 72 hours  Basename 11/29/11 0736 11/28/11 1925  WBC 8.9 7.1  NEUTROABS -- --  HGB 10.6* 11.2*  HCT 31.1* 31.6*  MCV 85.4 84.7  PLT 233 241   No results found for this basename: CKTOTAL:3,CKMB:3,CKMBINDEX:3,TROPONINI:3 in the last 72 hours No components found with this basename: POCBNP:3 No results found for this basename: DDIMER:2 in the last 72 hours No results found for this basename: HGBA1C:2 in the last 72 hours No results found for this basename: CHOL:2,HDL:2,LDLCALC:2,TRIG:2,CHOLHDL:2,LDLDIRECT:2 in the last 72 hours No results found for this basename: TSH,T4TOTAL,FREET3,T3FREE,THYROIDAB in the last 72 hours No results found for this basename: VITAMINB12:2,FOLATE:2,FERRITIN:2,TIBC:2,IRON:2,RETICCTPCT:2 in the last 72 hours  Micro Results: No results found for this or any previous visit (from the past 240 hour(s)).  Studies/Results: Ct Head Wo Contrast  11/28/2011  *RADIOLOGY REPORT*  Clinical Data: Hypertension with headaches  CT HEAD WITHOUT CONTRAST  Technique:  Contiguous axial images were obtained from the base of the skull through the vertex without contrast.  Comparison: 11/11/2010  Findings: There is no evidence for acute infarction, intracranial hemorrhage, mass lesion, hydrocephalus, or extra-axial fluid. There is mild atrophy with chronic microvascular ischemic  change. Vascular calcification is noted.  Moderate hyperostosis is seen without skull fracture or osseous destructive lesion.  There is no acute sinus or mastoid disease.  The appearance is similar compared with priors.  IMPRESSION: Age related atrophy and chronic microvascular ischemic change.  No intracranial hemorrhage or acute infarction.  Similar appearance to priors.  Original Report Authenticated By: Staci Righter, M.D.   Mri Brain Without Contrast  11/29/2011  *RADIOLOGY REPORT*  Clinical Data: Persistent headache. Hypertension. Numbness left toe.  MRI HEAD WITHOUT CONTRAST  Technique:  Multiplanar, multiecho pulse sequences of the brain and surrounding structures were obtained according to standard protocol without intravenous contrast.  Comparison: 11/28/2011 head CT and 02/14/2010.  No comparison brain MR.  Findings: No acute infarct.  In the medial superior right parietal lobe there is a 1 cm nonspecific cystic-appearing structure.  On the FLAIR sequence, there is increased signal within the adjacent sulci.  The constellation of findings is of indeterminate etiology.  Flow artifact as cause for the finding on FLAIR sequence is not excluded however, proteinaceous material or subarachnoid blood can cause a similar appearance.  This is not appreciated as acute hemorrhage on the recent performed head CT.  Subacute subarachnoid hemorrhage not excluded. Proteinaceous material related to this cystic structure is a possibility.  In retrospect, this cystic structure is noted on the prior CTs with minimal if any change.  Contrast enhanced MR in all three planes with FLAIR coronal imaging can be obtained at the present time as a baseline examination.  Location of findings may explain the patient's left sided toe numbness.  Hyperostosis frontalis interna incidentally noted.  Global age related atrophy without hydrocephalus.  Minimal white matter type changes consistent with result of small vessel disease.   Ectatic vertebral arteries and basilar artery.  Major intracranial vascular structures are patent.  Minimal partial opacification right mastoid air cells.  No mucosal thickening ethmoid sinus air cells and frontal sinuses.  Cerebellar tonsils slightly low-lying but within the range normal limits.  Transverse ligament hypertrophy.  Mild spinal stenosis cervical medullary junction.  Polypoid opacification below the sella extending into the dominant right sphenoid sinus has been noted since 2011.  This may represent primary sphenoid sinus disease rather than extension of pituitary gland.  Attention to this on follow-up.  Left parietal scalp banding.  The patient had remote trauma this may be related to such.  IMPRESSION: No acute infarct.  In the medial superior right parietal lobe there is a 1 cm nonspecific cystic-appearing structure.  On the FLAIR sequence, there is increased signal within the adjacent sulci.  The constellation of findings is of indeterminate etiology.  Flow artifact as cause for the finding on FLAIR sequence is not excluded however, proteinaceous material or subarachnoid blood can cause a similar appearance.  This is not appreciated as acute hemorrhage on the recent performed head CT.  Subacute subarachnoid hemorrhage not excluded. Proteinaceous material related to this cystic structure is a possibility.  In retrospect, this cystic structure is noted on the prior CTs with minimal if any change.  Contrast enhanced MR in all three planes with FLAIR coronal imaging can be obtained at  the present time as a baseline examination.  Location of findings may explain the patient's left sided toe numbness.  Please see above.  Willette Cluster, MR technologist to call to the floor and arrange follow-up imaging.  Original Report Authenticated By: Doug Sou, M.D.    Medications: I have reviewed the patient's current medications. Scheduled Meds:   . atorvastatin  20 mg Oral q1800  . buPROPion  300 mg Oral  Daily  . clonazePAM  1 mg Oral BID  . diphenhydrAMINE  25 mg Intravenous Once  . fenofibrate  160 mg Oral Daily  . fentaNYL  50 mcg Intravenous Once  . fentaNYL  50 mcg Intravenous Once  . folic acid  1 mg Oral BID  . glimepiride  2 mg Oral QAC breakfast  . hydrALAZINE  25 mg Oral QID  . labetalol  10 mg Intravenous Once  . metoCLOPramide (REGLAN) injection  10 mg Intravenous Once  . omega-3 acid ethyl esters  2 g Oral Q breakfast  . pantoprazole  40 mg Oral Q1200  . perphenazine-amitriptyline  1 tablet Oral Daily  . propranolol  40 mg Oral BID  . topiramate  50 mg Oral QHS  . trandolapril  1 mg Oral Daily  . DISCONTD: Fish Oil  2 capsule Oral Daily   Continuous Infusions:   . DISCONTD: sodium chloride 125 mL/hr at 11/28/11 2004   PRN Meds:.docusate sodium, labetalol, morphine injection, ondansetron, DISCONTD: labetalol Assessment/Plan: Patient Active Hospital Problem List: Accelerated hypertension (11/28/2011)   Assessment: Review of Renin/Aldo testing eliminates this as a cause of HTN. Pt needs evaluation of her renal arteries for stenosis but should avoid contrast studies due to renal function.  Plan: MRA of renal vessels and add norvasc 5 Mg and wean multidose Hydralazine.  Headache (11/28/2011)   Assessment: Pt with chronic headaches. May have on organic contribution, however would benefit from seeing a HA specialist as an out patient.   Diabetes mellitus (11/28/2011)   Assessment: Continue Amaryl and add SSI. Check Hb A1c.   H/O major depression (11/28/2011)   Assessment: Continue antidepressant medications.      Abnormal findings on MRI of brain (11/29/2011)   Assessment: I discussed the MRI findings with Dr. Jeannine Kitten of radiology. The MRI shows proteinaceous material. I've spoken with neurology and asked him to see the patient in consultation. At this point we'll continue to avoid any blood thinners and await their recommendations on this.       LOS: 1 day

## 2011-11-30 DIAGNOSIS — I10 Essential (primary) hypertension: Secondary | ICD-10-CM

## 2011-11-30 LAB — BASIC METABOLIC PANEL
BUN: 36 mg/dL — ABNORMAL HIGH (ref 6–23)
Chloride: 104 mEq/L (ref 96–112)
GFR calc Af Amer: 25 mL/min — ABNORMAL LOW (ref >90)
Potassium: 4.6 mEq/L (ref 3.5–5.1)
Sodium: 139 mEq/L (ref 135–145)

## 2011-11-30 LAB — GLUCOSE, CAPILLARY
Glucose-Capillary: 194 mg/dL — ABNORMAL HIGH (ref 70–99)
Glucose-Capillary: 185 mg/dL — ABNORMAL HIGH (ref 70–99)

## 2011-11-30 LAB — MAGNESIUM: Magnesium: 2 mg/dL (ref 1.5–2.5)

## 2011-11-30 MED ORDER — POLYETHYLENE GLYCOL 3350 17 G PO PACK
17.0000 g | PACK | Freq: Every day | ORAL | Status: DC
  Administered 2011-11-30 – 2011-12-03 (×4): 17 g via ORAL
  Filled 2011-11-30 (×6): qty 1

## 2011-11-30 NOTE — Progress Notes (Signed)
Subjective: Pt states that her headache is much better today. She rates her headache presently at a 0/10.  Interval History: Input from neurology noted. Objective: Filed Vitals:   11/30/11 0533 11/30/11 0902 11/30/11 1400 11/30/11 1749  BP: 116/54 127/57 122/64 158/69  Pulse: 67 79 68 70  Temp: 98.5 F (36.9 C) 98.3 F (36.8 C) 97.8 F (36.6 C) 97.6 F (36.4 C)  TempSrc: Oral Oral Oral Axillary  Resp: 17 18 18 20   Weight:      SpO2: 97% 93% 94% 92%   Weight change: 0.098 kg (3.5 oz)  Intake/Output Summary (Last 24 hours) at 11/30/11 1959 Last data filed at 11/30/11 0900  Gross per 24 hour  Intake    120 ml  Output    550 ml  Net   -430 ml    General: Alert, awake, oriented x3, in no acute distress.  HEENT: [place]/AT PEERL, EOMI Neck: Trachea midline,  no masses, no thyromegal,y no JVD, no carotid bruit OROPHARYNX:  Moist, No exudate/ erythema/lesions.  Heart: Regular rate and rhythm, without murmurs, rubs, gallops, PMI non-displaced, no heaves or thrills on palpation.  Lungs: Clear to auscultation, no wheezing or rhonchi noted. No increased vocal fremitus resonant to percussion  Abdomen: Soft, nontender, nondistended, positive bowel sounds, no masses no hepatosplenomegaly noted..  Neuro: No focal neurological deficits noted cranial nerves II through XII grossly intact. DTRs 2+ bilaterally upper and lower extremities. Strength functional and equal in bilateral upper and lower extremities. Pt has sensation intact to light tough, PP and proprioception in all peripheral dermatomes and specifically the left foot and leg. Musculoskeletal: No warm swelling or erythema around joints, no spinal tenderness noted. Psychiatric: Patient alert and oriented x3, good insight and cognition, good recent to remote recall.  Lab Results:  [facility] 11/30/11 0550 11/29/11 0736  NA 139 138  K 4.6 3.3*  CL 104 97  CO2 27 29  GLUCOSE 96 95  BUN 36* 25*  CREATININE 2.00* 1.44*  CALCIUM 9.9 10.5    MG 2.0 --  PHOS -- --    Basename 11/29/11 0736  AST 20  ALT 12  ALKPHOS 33*  BILITOT 0.3  PROT 6.4  ALBUMIN 3.3*   No results found for this basename: LIPASE:2,AMYLASE:2 in the last 72 hours  Basename 11/29/11 0736 11/28/11 1925  WBC 8.9 7.1  NEUTROABS -- --  HGB 10.6* 11.2*  HCT 31.1* 31.6*  MCV 85.4 84.7  PLT 233 241   No results found for this basename: CKTOTAL:3,CKMB:3,CKMBINDEX:3,TROPONINI:3 in the last 72 hours No components found with this basename: POCBNP:3 No results found for this basename: DDIMER:2 in the last 72 hours  Basename 11/29/11 0028  HGBA1C 6.4*    Basename 11/29/11 0736  CHOL 131  HDL 73  LDLCALC 40  TRIG 91  CHOLHDL 1.8  LDLDIRECT --   No results found for this basename: TSH,T4TOTAL,FREET3,T3FREE,THYROIDAB in the last 72 hours No results found for this basename: VITAMINB12:2,FOLATE:2,FERRITIN:2,TIBC:2,IRON:2,RETICCTPCT:2 in the last 72 hours  Micro Results: No results found for this or any previous visit (from the past 240 hour(s)).  Studies/Results: Ct Head Wo Contrast  11/28/2011  *RADIOLOGY REPORT*  Clinical Data: Hypertension with headaches  CT HEAD WITHOUT CONTRAST  Technique:  Contiguous axial images were obtained from the base of the skull through the vertex without contrast.  Comparison: 11/11/2010  Findings: There is no evidence for acute infarction, intracranial hemorrhage, mass lesion, hydrocephalus, or extra-axial fluid. There is mild atrophy with chronic microvascular ischemic change. Vascular  calcification is noted.  Moderate hyperostosis is seen without skull fracture or osseous destructive lesion.  There is no acute sinus or mastoid disease.  The appearance is similar compared with priors.  IMPRESSION: Age related atrophy and chronic microvascular ischemic change.  No intracranial hemorrhage or acute infarction.  Similar appearance to priors.  Original Report Authenticated By: Staci Righter, M.D.   Mri Brain Without  Contrast  11/29/2011  *RADIOLOGY REPORT*  Clinical Data: Persistent headache. Hypertension. Numbness left toe.  MRI HEAD WITHOUT CONTRAST  Technique:  Multiplanar, multiecho pulse sequences of the brain and surrounding structures were obtained according to standard protocol without intravenous contrast.  Comparison: 11/28/2011 head CT and 02/14/2010.  No comparison brain MR.  Findings: No acute infarct.  In the medial superior right parietal lobe there is a 1 cm nonspecific cystic-appearing structure.  On the FLAIR sequence, there is increased signal within the adjacent sulci.  The constellation of findings is of indeterminate etiology.  Flow artifact as cause for the finding on FLAIR sequence is not excluded however, proteinaceous material or subarachnoid blood can cause a similar appearance.  This is not appreciated as acute hemorrhage on the recent performed head CT.  Subacute subarachnoid hemorrhage not excluded. Proteinaceous material related to this cystic structure is a possibility.  In retrospect, this cystic structure is noted on the prior CTs with minimal if any change.  Contrast enhanced MR in all three planes with FLAIR coronal imaging can be obtained at the present time as a baseline examination.  Location of findings may explain the patient's left sided toe numbness.  Hyperostosis frontalis interna incidentally noted.  Global age related atrophy without hydrocephalus.  Minimal white matter type changes consistent with result of small vessel disease.  Ectatic vertebral arteries and basilar artery.  Major intracranial vascular structures are patent.  Minimal partial opacification right mastoid air cells.  No mucosal thickening ethmoid sinus air cells and frontal sinuses.  Cerebellar tonsils slightly low-lying but within the range normal limits.  Transverse ligament hypertrophy.  Mild spinal stenosis cervical medullary junction.  Polypoid opacification below the sella extending into the dominant right  sphenoid sinus has been noted since 2011.  This may represent primary sphenoid sinus disease rather than extension of pituitary gland.  Attention to this on follow-up.  Left parietal scalp banding.  The patient had remote trauma this may be related to such.  IMPRESSION: No acute infarct.  In the medial superior right parietal lobe there is a 1 cm nonspecific cystic-appearing structure.  On the FLAIR sequence, there is increased signal within the adjacent sulci.  The constellation of findings is of indeterminate etiology.  Flow artifact as cause for the finding on FLAIR sequence is not excluded however, proteinaceous material or subarachnoid blood can cause a similar appearance.  This is not appreciated as acute hemorrhage on the recent performed head CT.  Subacute subarachnoid hemorrhage not excluded. Proteinaceous material related to this cystic structure is a possibility.  In retrospect, this cystic structure is noted on the prior CTs with minimal if any change.  Contrast enhanced MR in all three planes with FLAIR coronal imaging can be obtained at the present time as a baseline examination.  Location of findings may explain the patient's left sided toe numbness.  Please see above.  Willette Cluster, MR technologist to call to the floor and arrange follow-up imaging.  Original Report Authenticated By: Doug Sou, M.D.    Medications: I have reviewed the patient's current medications. Scheduled Meds:    .  amLODipine  5 mg Oral Daily  . atorvastatin  20 mg Oral q1800  . buPROPion  300 mg Oral Daily  . clonazePAM  1 mg Oral BID  . fenofibrate  160 mg Oral Daily  . folic acid  1 mg Oral BID  . glimepiride  2 mg Oral QAC breakfast  . hydrALAZINE  25 mg Oral QID  . insulin aspart  0-5 Units Subcutaneous QHS  . insulin aspart  0-9 Units Subcutaneous TID WC  . omega-3 acid ethyl esters  2 g Oral Q breakfast  . pantoprazole  40 mg Oral Q1200  . perphenazine-amitriptyline  1 tablet Oral Daily  . propranolol   40 mg Oral BID  . topiramate  50 mg Oral QHS  . trandolapril  1 mg Oral Daily   Continuous Infusions:  PRN Meds:.docusate sodium, labetalol, morphine injection, ondansetron Assessment/Plan: Patient Active Hospital Problem List: Accelerated hypertension (11/28/2011)   Assessment: Review of Renin/Aldo testing eliminates this as a cause of HTN. No renal artery stenosis noted on MRA of the abdomen. Blood pressures are much better controlled today.  Plan: Continue Norvasc norvasc 5 Mg and and continue further titration of antihypertensive medications..  Headache (11/28/2011)   Assessment: Pt with chronic headaches. No appears to have resolved.   Diabetes mellitus (11/28/2011)   Assessment: Continue Amaryl and add SSI. Check Hb A1c.   H/O major depression (11/28/2011)   Assessment: Continue antidepressant medications.      Abnormal findings on MRI of brain (11/29/2011)   Assessment: I discussed the MRI findings with Dr. Jeannine Kitten of radiology. Appreciate urology input  Disposition: Patient will likely be discharged in the next 24-48 hours if clinical course continue in this fashion.     LOS: 2 days

## 2011-12-01 DIAGNOSIS — N179 Acute kidney failure, unspecified: Secondary | ICD-10-CM

## 2011-12-01 MED ORDER — METHOCARBAMOL 500 MG PO TABS
1000.0000 mg | ORAL_TABLET | Freq: Four times a day (QID) | ORAL | Status: AC
  Administered 2011-12-01 – 2011-12-02 (×4): 1000 mg via ORAL
  Filled 2011-12-01 (×5): qty 2

## 2011-12-01 MED ORDER — SODIUM CHLORIDE 0.9 % IV BOLUS (SEPSIS)
500.0000 mL | Freq: Once | INTRAVENOUS | Status: AC
  Administered 2011-12-01: 500 mL via INTRAVENOUS

## 2011-12-01 MED ORDER — NIFEDIPINE ER 30 MG PO TB24
30.0000 mg | ORAL_TABLET | Freq: Every day | ORAL | Status: DC
  Administered 2011-12-02 – 2011-12-03 (×2): 30 mg via ORAL
  Filled 2011-12-01 (×4): qty 1

## 2011-12-01 NOTE — Progress Notes (Signed)
Subjective: Patient states that when she woke this morning she had a headache of 4/10 which increased to 7/10 after taking Wellbutrin. Presently she states her intensity of headache as 1/10. I spoke with a neurologist who feels that methocarbamol would be an appropriate trial to ensure that there's not a component of spasm is contributing to her headaches.  Interval history: The patient presented with some degree of renal insufficiency this has now worsened and her creatinine of 2.0 with a BUN of 36 up from baseline of creatinine of 1.4 and a BUN of 27. Objective: Filed Vitals:   11/30/11 1749 11/30/11 2144 12/01/11 0431 12/01/11 0830  BP: 158/69 168/63 128/53   Pulse: 70 76 66   Temp: 97.6 F (36.4 C) 98 F (36.7 C) 97 F (36.1 C)   TempSrc: Axillary Oral Oral   Resp: 20 20 18    Height:    5\' 3"  (1.6 m)  Weight:  71 kg (156 lb 8.4 oz)    SpO2: 92% 100% 96%    Weight change: 0.602 kg (1 lb 5.2 oz)  Intake/Output Summary (Last 24 hours) at 12/01/11 0943 Last data filed at 12/01/11 0700  Gross per 24 hour  Intake    480 ml  Output      0 ml  Net    480 ml    General: Alert, awake, oriented x3, in no acute distress.  HEENT: [place]/AT PEERL, EOMI Heart: Regular rate and rhythm, without murmurs, rubs, gallops. Lungs: Clear to auscultation, no wheezing or rhonchi noted. No increased vocal fremitus resonant to percussion  Abdomen: Soft, nontender, nondistended, positive bowel sounds, no masses no hepatosplenomegaly noted..  Neuro: No focal neurological deficits noted cranial nerves II through XII grossly intact. DTRs 2+ bilaterally upper and lower extremities. Strength functional in bilateral upper and lower extremities. Musculoskeletal: No warm swelling or erythema around joints, no spinal tenderness noted.  Lab Results:  [facility] 11/30/11 0550 11/29/11 0736  NA 139 138  K 4.6 3.3*  CL 104 97  CO2 27 29  GLUCOSE 96 95  BUN 36* 25*  CREATININE 2.00* 1.44*  CALCIUM 9.9 10.5  MG  2.0 --  PHOS -- --    Basename 11/29/11 0736  AST 20  ALT 12  ALKPHOS 33*  BILITOT 0.3  PROT 6.4  ALBUMIN 3.3*   No results found for this basename: LIPASE:2,AMYLASE:2 in the last 72 hours  Basename 11/29/11 0736 11/28/11 1925  WBC 8.9 7.1  NEUTROABS -- --  HGB 10.6* 11.2*  HCT 31.1* 31.6*  MCV 85.4 84.7  PLT 233 241   No results found for this basename: CKTOTAL:3,CKMB:3,CKMBINDEX:3,TROPONINI:3 in the last 72 hours No components found with this basename: POCBNP:3 No results found for this basename: DDIMER:2 in the last 72 hours  Basename 11/29/11 0028  HGBA1C 6.4*    Basename 11/29/11 0736  CHOL 131  HDL 73  LDLCALC 40  TRIG 91  CHOLHDL 1.8  LDLDIRECT --   No results found for this basename: TSH,T4TOTAL,FREET3,T3FREE,THYROIDAB in the last 72 hours No results found for this basename: VITAMINB12:2,FOLATE:2,FERRITIN:2,TIBC:2,IRON:2,RETICCTPCT:2 in the last 72 hours  Micro Results: No results found for this or any previous visit (from the past 240 hour(s)).  Studies/Results: Ct Head Wo Contrast  11/28/2011  *RADIOLOGY REPORT*  Clinical Data: Hypertension with headaches  CT HEAD WITHOUT CONTRAST  Technique:  Contiguous axial images were obtained from the base of the skull through the vertex without contrast.  Comparison: 11/11/2010  Findings: There is no evidence for  acute infarction, intracranial hemorrhage, mass lesion, hydrocephalus, or extra-axial fluid. There is mild atrophy with chronic microvascular ischemic change. Vascular calcification is noted.  Moderate hyperostosis is seen without skull fracture or osseous destructive lesion.  There is no acute sinus or mastoid disease.  The appearance is similar compared with priors.  IMPRESSION: Age related atrophy and chronic microvascular ischemic change.  No intracranial hemorrhage or acute infarction.  Similar appearance to priors.  Original Report Authenticated By: Staci Righter, M.D.   Mri Brain Without  Contrast  11/29/2011  **ADDENDUM** CREATED: 11/29/2011 12:54:39  Addendum should have stated that the FLAIR altered signal intensity is within the medial right parietal sulcus (and not within the parietal lobe).  Nonspecific 1 cm cystic structure is within the right parietal lobe and can be evaluated on follow-up MR in 6 months.  Results discussed with Dr. Zigmund Daniel.  **END ADDENDUM** SIGNED BY: Doran Heater. Jeannine Kitten, M.D.   11/29/2011  **ADDENDUM** CREATED: 11/29/2011 12:21:17  The patient is not able to receive contrast secondary to poor renal function.  Coronal FLAIR imaging was obtained which confirms that the abnormality within the sulci of the medial right parietal lobe is a true finding rather than representing artifact.  This may represent subacute in subarachnoid blood or proteinaceous material.  It is difficult to make a connection with the right parietal intra-axial 1 cm cystic structure.  This latter structure can be evaluated on follow-up imaging to confirm stability.  To exclude possibility of subarachnoid blood or meningitis correlation with cerebrospinal fluid analysis would be necessary.  Cortical vein thrombosis contributing to the subarachnoid blood is a consideration.  Results called to Dr. Zigmund Daniel 11/29/2011 12:30 p.m.  **END ADDENDUM** SIGNED BY: Doran Heater. Jeannine Kitten, M.D.   11/29/2011  *RADIOLOGY REPORT*  Clinical Data: Persistent headache. Hypertension. Numbness left toe.  MRI HEAD WITHOUT CONTRAST  Technique:  Multiplanar, multiecho pulse sequences of the brain and surrounding structures were obtained according to standard protocol without intravenous contrast.  Comparison: 11/28/2011 head CT and 02/14/2010.  No comparison brain MR.  Findings: No acute infarct.  In the medial superior right parietal lobe there is a 1 cm nonspecific cystic-appearing structure.  On the FLAIR sequence, there is increased signal within the adjacent sulci.  The constellation of findings is of indeterminate etiology.  Flow  artifact as cause for the finding on FLAIR sequence is not excluded however, proteinaceous material or subarachnoid blood can cause a similar appearance.  This is not appreciated as acute hemorrhage on the recent performed head CT.  Subacute subarachnoid hemorrhage not excluded. Proteinaceous material related to this cystic structure is a possibility.  In retrospect, this cystic structure is noted on the prior CTs with minimal if any change.  Contrast enhanced MR in all three planes with FLAIR coronal imaging can be obtained at the present time as a baseline examination.  Location of findings may explain the patient's left sided toe numbness.  Hyperostosis frontalis interna incidentally noted.  Global age related atrophy without hydrocephalus.  Minimal white matter type changes consistent with result of small vessel disease.  Ectatic vertebral arteries and basilar artery.  Major intracranial vascular structures are patent.  Minimal partial opacification right mastoid air cells.  No mucosal thickening ethmoid sinus air cells and frontal sinuses.  Cerebellar tonsils slightly low-lying but within the range normal limits.  Transverse ligament hypertrophy.  Mild spinal stenosis cervical medullary junction.  Polypoid opacification below the sella extending into the dominant right sphenoid sinus has been noted since 2011.  This may represent primary sphenoid sinus disease rather than extension of pituitary gland.  Attention to this on follow-up.  Left parietal scalp banding.  The patient had remote trauma this may be related to such.  IMPRESSION: No acute infarct.  In the medial superior right parietal lobe there is a 1 cm nonspecific cystic-appearing structure.  On the FLAIR sequence, there is increased signal within the adjacent sulci.  The constellation of findings is of indeterminate etiology.  Flow artifact as cause for the finding on FLAIR sequence is not excluded however, proteinaceous material or subarachnoid blood  can cause a similar appearance.  This is not appreciated as acute hemorrhage on the recent performed head CT.  Subacute subarachnoid hemorrhage not excluded. Proteinaceous material related to this cystic structure is a possibility.  In retrospect, this cystic structure is noted on the prior CTs with minimal if any change.  Contrast enhanced MR in all three planes with FLAIR coronal imaging can be obtained at the present time as a baseline examination.  Location of findings may explain the patient's left sided toe numbness.  Please see above.  Willette Cluster, MR technologist to call to the floor and arrange follow-up imaging.  Original Report Authenticated By: Doug Sou, M.D.   [facility] Abdomen Wo Contrast  11/29/2011  *RADIOLOGY REPORT*  Clinical Data:  Elevated creatinine and evaluate for renal artery stenosis.  MRA ABDOMEN WITHOUT CONTRAST  Technique:  Multiplanar, multiecho pulse sequences of the abdomen were obtained without intravenous contrast.  Angiographic images of abdomen and pelvis were obtained using MRA technique without intravenous contrast.  Comparison:   None.  MRA ABDOMEN  Findings:  The left renal artery appears to be widely patent. There is a single main left renal artery.  There is evidence for an accessory superior right renal artery.  The main right renal artery is low lying but above the IMA.  The abdominal aorta is patent. There appears to be infrarenal atherosclerotic disease without evidence of aneurysmal dilatation.  The origin of the celiac trunk and SMA are patent.  There is a T2 bright structure in each kidney which probably represents cysts but limited evaluation on this MRA examination. Dependent subcutaneous edema in the flank regions.  Probable T2 bright cyst in the left hepatic lobe.  IMPRESSION: No evidence for renal artery stenosis.  Incidentally, the main right renal artery is low lying and there appears to be a superior right accessory renal artery.  Probable renal and hepatic  cysts.  Original Report Authenticated By: Markus Daft, M.D.    Medications: I have reviewed the patient's current medications. Scheduled Meds:   . atorvastatin  20 mg Oral q1800  . buPROPion  300 mg Oral Daily  . clonazePAM  1 mg Oral BID  . fenofibrate  160 mg Oral Daily  . folic acid  1 mg Oral BID  . glimepiride  2 mg Oral QAC breakfast  . insulin aspart  0-5 Units Subcutaneous QHS  . insulin aspart  0-9 Units Subcutaneous TID WC  . NIFEdipine  30 mg Oral Daily  . omega-3 acid ethyl esters  2 g Oral Q breakfast  . pantoprazole  40 mg Oral Q1200  . perphenazine-amitriptyline  1 tablet Oral Daily  . polyethylene glycol  17 g Oral Daily  . propranolol  40 mg Oral BID  . sodium chloride  500 mL Intravenous Once  . topiramate  50 mg Oral QHS  . DISCONTD: amLODipine  5 mg Oral Daily  . DISCONTD:  hydrALAZINE  25 mg Oral QID  . DISCONTD: trandolapril  1 mg Oral Daily   Continuous Infusions:  PRN Meds:.docusate sodium, morphine injection, ondansetron, DISCONTD: labetalol Assessment/Plan: Patient Active Hospital Problem List: Acute renal failure (12/01/2011)   Assessment: Patient presented in a mild degree of acute renal failure which was felt to be somewhat prerenal nature. The renal failure however is worsening. In review of her previous records the patient had a baseline creatinine of 1.4 and 05/04/2011 which is now worsened to a creatinine of 2.0. There appears to be prerenal component based on the BUN/creatinine ratio however this also could be contributed to by her ACE receptor blocker.    Plan: In light of this I will stop the trandolapril and give a small dose of IV fluid bolus of 500 ml.  Accelerated hypertension (11/28/2011)   Assessment: Patient has accelerated hypertension which required hospitalization and this episode. This patient is on [place] her usual regimen of antihypertensive medications. However she is unsure whether the medications were prescribed for her blood pressure  versus prophylaxis for her migraine headaches. Given the severity of the patient's migraine headaches I am hesitant to stop her Inderal. However she's had a significant decrease in her blood pressure which can contribute to her renal dysfunction. I had to stop her trandolapril and I will replace that with nifedipine long-acting. Given the multiple dosings of the hydralazine and her risk for noncompliance with this I will also stop the hydralazine. This the patient will be on nifedipine and Inderal.   Headache (11/28/2011)   Assessment: Headaches improved considerably however based on patient's home medication list she was prescribed Wellbutrin however apparently this is a trigger for her headaches and these will this discontinued. Additionally patient states that she did stop taking the Wellbutrin prior to her hospitalization.    Plan: Discontinue Wellbutrin   Diabetes mellitus (11/28/2011)   Assessment: Patient has a hemoglobin A1c of 6.4 which shows relatively good control her long-term basis. Presently her blood sugars are well-controlled.   Plan: Continue Amaryl and sliding scale insulin.   H/O major depression (11/28/2011)   Assessment: Patient had been on Wellbutrin for her depression however per the patient this is been discontinued I will defer to her primary care physician to institute medications for this chronic disorder.    Hypokalemia (11/29/2011)   Assessment: Resolved     Nonspecific abnormal findings on radiological and examination of skull and head (11/29/2011)   Assessment: Neurology is recommended a lumbar puncture to be performed to further assess the findings.    LOS: 3 days

## 2011-12-01 NOTE — Progress Notes (Addendum)
S/O: The patient's headache had improved initially by this AM, then worsened after her Wellbutrin dose. Has not yet received methocarbamol. Has a 4-5/10 headache. States she woke up with her headache at that intensity. Yesterday, her headache had spontaneously improved to 2/10.  BP 128/53  Pulse 66  Temp 97 F (36.1 C) (Oral)  Resp 18  Ht 5\' 3"  (1.6 m)  Wt 71 kg (156 lb 8.4 oz)  BMI 27.73 kg/m2  SpO2 96%  Neurological exam:  Ment: Intact to complex questions and commands.  CN: Face symmetric. Phonation intact.  Motor: 5/5 x 4.    A/R:  1. Trial of methocarbamol 1000 mg po QID. Assess for relief of frontal headache and paraspinal cervical muscle spasm after 2 doses have been given.  2. LP can be performed as an outpatient, after outpatient neurology follow up and repeat MRI in 4-6 weeks to assess for stability of the extraaxial cyst with adjacent signal abnormality. I have discussed the MRI findings with Dr. Leonie Man. The signal adjacent to the cyst may in fact be a benign local ossification of the meninges.    Electronically signed: Dr. Kerney Elbe

## 2011-12-01 NOTE — Progress Notes (Signed)
Utilization review completed.  

## 2011-12-02 LAB — BASIC METABOLIC PANEL
BUN: 37 mg/dL — ABNORMAL HIGH (ref 6–23)
CO2: 29 mEq/L (ref 19–32)
Calcium: 9.4 mg/dL (ref 8.4–10.5)
Glucose, Bld: 87 mg/dL (ref 70–99)
Sodium: 140 mEq/L (ref 135–145)

## 2011-12-02 LAB — GLUCOSE, CAPILLARY
Glucose-Capillary: 88 mg/dL (ref 70–99)
Glucose-Capillary: 147 mg/dL — ABNORMAL HIGH (ref 70–99)
Glucose-Capillary: 165 mg/dL — ABNORMAL HIGH (ref 70–99)

## 2011-12-02 MED ORDER — SODIUM CHLORIDE 0.9 % IV BOLUS (SEPSIS)
1000.0000 mL | Freq: Once | INTRAVENOUS | Status: AC
  Administered 2011-12-02: 1000 mL via INTRAVENOUS

## 2011-12-02 NOTE — Progress Notes (Signed)
Subjective: Patient states that she is feeling better today. However has not had a BM in 3 days.  Interval history: Renal function essentially unchanged.  Objective: Filed Vitals:   12/01/11 2105 12/02/11 0541 12/02/11 0957 12/02/11 1400  BP: 142/52 156/68 161/50 135/54  Pulse: 72 65 71 72  Temp: 98 F (36.7 C) 97.6 F (36.4 C) 98.2 F (36.8 C) 98 F (36.7 C)  TempSrc: Oral Oral Oral Oral  Resp: 18 18 18 18   Height: 5\' 3"  (1.6 m)     Weight: 71 kg (156 lb 8.4 oz)     SpO2: 96% 94% 99% 98%   Weight change: 0 kg (0 lb)  Intake/Output Summary (Last 24 hours) at 12/02/11 1646 Last data filed at 12/02/11 1300  Gross per 24 hour  Intake   1080 ml  Output      2 ml  Net   1078 ml    General: Alert and pleasant. Feels that she is almost at her baseline.  HEENT: [place]/AT PEERL, EOMI Heart: Regular rate and rhythm. Lungs: Clear to auscultation. There is no wheezing or rhonchi noted. Abdomen: Obese, soft, nontender and nondistended.there are normoactive bowel sounds present. Neuro: No focal neurological deficits noted cranial nerves II through XII grossly intact.  Musculoskeletal: No warm swelling or erythema around joints, no spinal tenderness noted. Extremities: No clubbing, cyanosis, or edema. Lab Results:  Basename 12/02/11 0730 11/30/11 0550  NA 140 139  K 4.1 4.6  CL 102 104  CO2 29 27  GLUCOSE 87 96  BUN 37* 36*  CREATININE 1.98* 2.00*  CALCIUM 9.4 9.9  MG -- 2.0  PHOS -- --   No results found for this basename: AST:2,ALT:2,ALKPHOS:2,BILITOT:2,PROT:2,ALBUMIN:2 in the last 72 hours No results found for this basename: LIPASE:2,AMYLASE:2 in the last 72 hours No results found for this basename: WBC:2,NEUTROABS:2,HGB:2,HCT:2,MCV:2,PLT:2 in the last 72 hours No results found for this basename: CKTOTAL:3,CKMB:3,CKMBINDEX:3,TROPONINI:3 in the last 72 hours No components found with this basename: POCBNP:3 No results found for this basename: DDIMER:2 in the last 72 hours No  results found for this basename: HGBA1C:2 in the last 72 hours No results found for this basename: CHOL:2,HDL:2,LDLCALC:2,TRIG:2,CHOLHDL:2,LDLDIRECT:2 in the last 72 hours No results found for this basename: TSH,T4TOTAL,FREET3,T3FREE,THYROIDAB in the last 72 hours No results found for this basename: VITAMINB12:2,FOLATE:2,FERRITIN:2,TIBC:2,IRON:2,RETICCTPCT:2 in the last 72 hours  Micro Results: No results found for this or any previous visit (from the past 240 hour(s)).  Studies/Results: Ct Head Wo Contrast  11/28/2011  *RADIOLOGY REPORT*  Clinical Data: Hypertension with headaches  CT HEAD WITHOUT CONTRAST  Technique:  Contiguous axial images were obtained from the base of the skull through the vertex without contrast.  Comparison: 11/11/2010  Findings: There is no evidence for acute infarction, intracranial hemorrhage, mass lesion, hydrocephalus, or extra-axial fluid. There is mild atrophy with chronic microvascular ischemic change. Vascular calcification is noted.  Moderate hyperostosis is seen without skull fracture or osseous destructive lesion.  There is no acute sinus or mastoid disease.  The appearance is similar compared with priors.  IMPRESSION: Age related atrophy and chronic microvascular ischemic change.  No intracranial hemorrhage or acute infarction.  Similar appearance to priors.  Original Report Authenticated By: Staci Righter, M.D.   Mri Brain Without Contrast  11/29/2011  **ADDENDUM** CREATED: 11/29/2011 12:54:39  Addendum should have stated that the FLAIR altered signal intensity is within the medial right parietal sulcus (and not within the parietal lobe).  Nonspecific 1 cm cystic structure is within the right parietal  lobe and can be evaluated on follow-up MR in 6 months.  Results discussed with Dr. Zigmund Daniel.  **END ADDENDUM** SIGNED BY: Doran Heater. Jeannine Kitten, M.D.   11/29/2011  **ADDENDUM** CREATED: 11/29/2011 12:21:17  The patient is not able to receive contrast secondary to poor renal  function.  Coronal FLAIR imaging was obtained which confirms that the abnormality within the sulci of the medial right parietal lobe is a true finding rather than representing artifact.  This may represent subacute in subarachnoid blood or proteinaceous material.  It is difficult to make a connection with the right parietal intra-axial 1 cm cystic structure.  This latter structure can be evaluated on follow-up imaging to confirm stability.  To exclude possibility of subarachnoid blood or meningitis correlation with cerebrospinal fluid analysis would be necessary.  Cortical vein thrombosis contributing to the subarachnoid blood is a consideration.  Results called to Dr. Zigmund Daniel 11/29/2011 12:30 p.m.  **END ADDENDUM** SIGNED BY: Doran Heater. Jeannine Kitten, M.D.   11/29/2011  *RADIOLOGY REPORT*  Clinical Data: Persistent headache. Hypertension. Numbness left toe.  MRI HEAD WITHOUT CONTRAST  Technique:  Multiplanar, multiecho pulse sequences of the brain and surrounding structures were obtained according to standard protocol without intravenous contrast.  Comparison: 11/28/2011 head CT and 02/14/2010.  No comparison brain MR.  Findings: No acute infarct.  In the medial superior right parietal lobe there is a 1 cm nonspecific cystic-appearing structure.  On the FLAIR sequence, there is increased signal within the adjacent sulci.  The constellation of findings is of indeterminate etiology.  Flow artifact as cause for the finding on FLAIR sequence is not excluded however, proteinaceous material or subarachnoid blood can cause a similar appearance.  This is not appreciated as acute hemorrhage on the recent performed head CT.  Subacute subarachnoid hemorrhage not excluded. Proteinaceous material related to this cystic structure is a possibility.  In retrospect, this cystic structure is noted on the prior CTs with minimal if any change.  Contrast enhanced MR in all three planes with FLAIR coronal imaging can be obtained at the present  time as a baseline examination.  Location of findings may explain the patient's left sided toe numbness.  Hyperostosis frontalis interna incidentally noted.  Global age related atrophy without hydrocephalus.  Minimal white matter type changes consistent with result of small vessel disease.  Ectatic vertebral arteries and basilar artery.  Major intracranial vascular structures are patent.  Minimal partial opacification right mastoid air cells.  No mucosal thickening ethmoid sinus air cells and frontal sinuses.  Cerebellar tonsils slightly low-lying but within the range normal limits.  Transverse ligament hypertrophy.  Mild spinal stenosis cervical medullary junction.  Polypoid opacification below the sella extending into the dominant right sphenoid sinus has been noted since 2011.  This may represent primary sphenoid sinus disease rather than extension of pituitary gland.  Attention to this on follow-up.  Left parietal scalp banding.  The patient had remote trauma this may be related to such.  IMPRESSION: No acute infarct.  In the medial superior right parietal lobe there is a 1 cm nonspecific cystic-appearing structure.  On the FLAIR sequence, there is increased signal within the adjacent sulci.  The constellation of findings is of indeterminate etiology.  Flow artifact as cause for the finding on FLAIR sequence is not excluded however, proteinaceous material or subarachnoid blood can cause a similar appearance.  This is not appreciated as acute hemorrhage on the recent performed head CT.  Subacute subarachnoid hemorrhage not excluded. Proteinaceous material related to this cystic  structure is a possibility.  In retrospect, this cystic structure is noted on the prior CTs with minimal if any change.  Contrast enhanced MR in all three planes with FLAIR coronal imaging can be obtained at the present time as a baseline examination.  Location of findings may explain the patient's left sided toe numbness.  Please see  above.  Willette Cluster, MR technologist to call to the floor and arrange follow-up imaging.  Original Report Authenticated By: Doug Sou, M.D.   [facility] Abdomen Wo Contrast  11/29/2011  *RADIOLOGY REPORT*  Clinical Data:  Elevated creatinine and evaluate for renal artery stenosis.  MRA ABDOMEN WITHOUT CONTRAST  Technique:  Multiplanar, multiecho pulse sequences of the abdomen were obtained without intravenous contrast.  Angiographic images of abdomen and pelvis were obtained using MRA technique without intravenous contrast.  Comparison:   None.  MRA ABDOMEN  Findings:  The left renal artery appears to be widely patent. There is a single main left renal artery.  There is evidence for an accessory superior right renal artery.  The main right renal artery is low lying but above the IMA.  The abdominal aorta is patent. There appears to be infrarenal atherosclerotic disease without evidence of aneurysmal dilatation.  The origin of the celiac trunk and SMA are patent.  There is a T2 bright structure in each kidney which probably represents cysts but limited evaluation on this MRA examination. Dependent subcutaneous edema in the flank regions.  Probable T2 bright cyst in the left hepatic lobe.  IMPRESSION: No evidence for renal artery stenosis.  Incidentally, the main right renal artery is low lying and there appears to be a superior right accessory renal artery.  Probable renal and hepatic cysts.  Original Report Authenticated By: Markus Daft, M.D.    Medications: I have reviewed the patient's current medications. Scheduled Meds:    . atorvastatin  20 mg Oral q1800  . clonazePAM  1 mg Oral BID  . fenofibrate  160 mg Oral Daily  . folic acid  1 mg Oral BID  . glimepiride  2 mg Oral QAC breakfast  . insulin aspart  0-5 Units Subcutaneous QHS  . insulin aspart  0-9 Units Subcutaneous TID WC  . methocarbamol  1,000 mg Oral QID  . NIFEdipine  30 mg Oral Daily  . omega-3 acid ethyl esters  2 g Oral Q breakfast    . pantoprazole  40 mg Oral Q1200  . perphenazine-amitriptyline  1 tablet Oral Daily  . polyethylene glycol  17 g Oral Daily  . propranolol  40 mg Oral BID  . topiramate  50 mg Oral QHS   Continuous Infusions:  PRN Meds:.docusate sodium, morphine injection, ondansetron Assessment/Plan: Patient Active Hospital Problem List: Acute renal failure (12/01/2011)   Assessment: Patient's renal function is essentially unchanged at this time. I will give 1 L of IV fluid over 4 hours. I will recheck renal function tomorrow  Accelerated hypertension (11/28/2011)   Assessment: Blood pressure markedly improved with change of medication regimen. We'll observe for at least one more day.   Headache (11/28/2011)   Assessment: Headaches appear to be considerably improved. The patient no longer has any photophobia at this time rates her headache at a 1/10.   Diabetes mellitus (11/28/2011)   Assessment: Patient has a hemoglobin A1c of 6.4 which shows relatively good control her long-term basis. Presently her blood sugars are well-controlled.   Plan: Continue Amaryl and sliding scale insulin.   H/O major depression (11/28/2011)  Assessment: Patient had been on Wellbutrin for her depression however per the patient this is been discontinued I will defer to her primary care physician to institute medications for this chronic disorder.    Hypokalemia (11/29/2011)   Assessment: Resolved     Nonspecific abnormal findings on radiological and examination of skull and head (11/29/2011)   Assessment: I have spoken with Dr. Cheral Marker - neurologist who feels that these findings are chronic findings and recommends outpatient followup for the patient and a repeat followup MRI in 4-6 weeks.     LOS: 4 days

## 2011-12-03 LAB — BASIC METABOLIC PANEL
CO2: 26 mEq/L (ref 19–32)
Chloride: 105 mEq/L (ref 96–112)
GFR calc Af Amer: 30 mL/min — ABNORMAL LOW (ref >90)
Potassium: 3.7 mEq/L (ref 3.5–5.1)

## 2011-12-03 LAB — GLUCOSE, CAPILLARY

## 2011-12-03 MED ORDER — NIFEDIPINE ER 30 MG PO TB24: 30.0000 mg | ORAL_TABLET | Freq: Every day | ORAL | Status: DC

## 2011-12-03 MED ORDER — SODIUM CHLORIDE 0.9 % IV BOLUS (SEPSIS)
500.0000 mL | Freq: Once | INTRAVENOUS | Status: AC
  Administered 2011-12-03: 500 mL via INTRAVENOUS

## 2011-12-03 NOTE — Discharge Summary (Addendum)
Tamara Arnold MRN: [id number] DOB/AGE: 29-Mar-1926 78 y.o.  Admit date: 11/28/2011 Discharge date: 12/03/2011  Primary Care Physician:  Stephens Shire, MD   Discharge Diagnoses:   Patient Active Problem List  Diagnosis  . Headache  . Accelerated hypertension  . Diabetes mellitus  . H/O major depression  . Hyperlipidemia  . Hypokalemia  . Nonspecific abnormal findings on radiological and examination of skull and head  . Acute renal failure    DISCHARGE MEDICATION: Medication List  As of 12/03/2011 11:34 AM   STOP taking these medications         buPROPion 300 MG 24 hr tablet      fenofibrate 160 MG tablet      hydrALAZINE 25 MG tablet      perphenazine 4 MG tablet         TAKE these medications         ascorbic acid 500 MG tablet   Commonly known as: VITAMIN C   Take 500 mg by mouth daily.      vitamin C 500 MG tablet   Commonly known as: ASCORBIC ACID   Take 500 mg by mouth daily.      aspirin 81 MG chewable tablet   Chew 81 mg by mouth daily.      beta carotene w/minerals tablet   Take 1 tablet by mouth 2 (two) times daily.      multivitamins ther. w/minerals Tabs   Take 1 tablet by mouth daily.      clonazePAM 1 MG tablet   Commonly known as: KLONOPIN   Take 1 mg by mouth 2 (two) times daily. For anxiety      docusate sodium 100 MG capsule   Commonly known as: COLACE   Take 100 mg by mouth daily as needed. For constipation      fenofibrate 145 MG tablet   Commonly known as: TRICOR   Take 145 mg by mouth daily.      Fish Oil 1200 MG Caps   Take 2 capsules by mouth daily.      folic acid 1 MG tablet   Commonly known as: FOLVITE   Take 1 mg by mouth 2 (two) times daily.      glimepiride 2 MG tablet   Commonly known as: AMARYL   Take 2 mg by mouth daily before breakfast.      meloxicam 15 MG tablet   Commonly known as: MOBIC   Take 15 mg by mouth daily.      moexipril 7.5 MG tablet   Commonly known as: UNIVASC   Take 7.5 mg by mouth at  bedtime.      NIFEdipine 30 MG 24 hr tablet   Commonly known as: PROCARDIA-XL/ADALAT CC   Take 1 tablet (30 mg total) by mouth daily.      omeprazole 20 MG capsule   Commonly known as: PRILOSEC   Take 20 mg by mouth daily.      OVER THE COUNTER MEDICATION   Take 1 capsule by mouth daily as needed. Papaya enzyme      perphenazine-amitriptyline 2-25 MG Tabs   Commonly known as: ETRAFON/TRIAVIL   Take 1 tablet by mouth daily.      propranolol 40 MG tablet   Commonly known as: INDERAL   Take 40 mg by mouth 2 (two) times daily.      rosuvastatin 10 MG tablet   Commonly known as: CRESTOR   Take 5 mg by mouth daily.  topiramate 25 MG capsule   Commonly known as: TOPAMAX   Take 50 mg by mouth at bedtime.      vitamin E 400 UNIT capsule   Generic drug: vitamin E   Take 400 Units by mouth daily.              Consults: Treatment Team:  Catarina Hartshorn, MD   SIGNIFICANT DIAGNOSTIC STUDIES:  Ct Head Wo Contrast  11/28/2011  *RADIOLOGY REPORT*  Clinical Data: Hypertension with headaches  CT HEAD WITHOUT CONTRAST  Technique:  Contiguous axial images were obtained from the base of the skull through the vertex without contrast.  Comparison: 11/11/2010  Findings: There is no evidence for acute infarction, intracranial hemorrhage, mass lesion, hydrocephalus, or extra-axial fluid. There is mild atrophy with chronic microvascular ischemic change. Vascular calcification is noted.  Moderate hyperostosis is seen without skull fracture or osseous destructive lesion.  There is no acute sinus or mastoid disease.  The appearance is similar compared with priors.  IMPRESSION: Age related atrophy and chronic microvascular ischemic change.  No intracranial hemorrhage or acute infarction.  Similar appearance to priors.  Original Report Authenticated By: Staci Righter, M.D.   Mri Brain Without Contrast  11/29/2011  **ADDENDUM** CREATED: 11/29/2011 12:54:39  Addendum should have stated that the  FLAIR altered signal intensity is within the medial right parietal sulcus (and not within the parietal lobe).  Nonspecific 1 cm cystic structure is within the right parietal lobe and can be evaluated on follow-up MR in 6 months.  Results discussed with Dr. Zigmund Daniel.  **END ADDENDUM** SIGNED BY: Doran Heater. Jeannine Kitten, M.D.   11/29/2011  **ADDENDUM** CREATED: 11/29/2011 12:21:17  The patient is not able to receive contrast secondary to poor renal function.  Coronal FLAIR imaging was obtained which confirms that the abnormality within the sulci of the medial right parietal lobe is a true finding rather than representing artifact.  This may represent subacute in subarachnoid blood or proteinaceous material.  It is difficult to make a connection with the right parietal intra-axial 1 cm cystic structure.  This latter structure can be evaluated on follow-up imaging to confirm stability.  To exclude possibility of subarachnoid blood or meningitis correlation with cerebrospinal fluid analysis would be necessary.  Cortical vein thrombosis contributing to the subarachnoid blood is a consideration.  Results called to Dr. Zigmund Daniel 11/29/2011 12:30 p.m.  **END ADDENDUM** SIGNED BY: Doran Heater. Jeannine Kitten, M.D.   11/29/2011  *RADIOLOGY REPORT*  Clinical Data: Persistent headache. Hypertension. Numbness left toe.  MRI HEAD WITHOUT CONTRAST  Technique:  Multiplanar, multiecho pulse sequences of the brain and surrounding structures were obtained according to standard protocol without intravenous contrast.  Comparison: 11/28/2011 head CT and 02/14/2010.  No comparison brain MR.  Findings: No acute infarct.  In the medial superior right parietal lobe there is a 1 cm nonspecific cystic-appearing structure.  On the FLAIR sequence, there is increased signal within the adjacent sulci.  The constellation of findings is of indeterminate etiology.  Flow artifact as cause for the finding on FLAIR sequence is not excluded however, proteinaceous material or  subarachnoid blood can cause a similar appearance.  This is not appreciated as acute hemorrhage on the recent performed head CT.  Subacute subarachnoid hemorrhage not excluded. Proteinaceous material related to this cystic structure is a possibility.  In retrospect, this cystic structure is noted on the prior CTs with minimal if any change.  Contrast enhanced MR in all three planes with FLAIR coronal imaging can be obtained at  the present time as a baseline examination.  Location of findings may explain the patient's left sided toe numbness.  Hyperostosis frontalis interna incidentally noted.  Global age related atrophy without hydrocephalus.  Minimal white matter type changes consistent with result of small vessel disease.  Ectatic vertebral arteries and basilar artery.  Major intracranial vascular structures are patent.  Minimal partial opacification right mastoid air cells.  No mucosal thickening ethmoid sinus air cells and frontal sinuses.  Cerebellar tonsils slightly low-lying but within the range normal limits.  Transverse ligament hypertrophy.  Mild spinal stenosis cervical medullary junction.  Polypoid opacification below the sella extending into the dominant right sphenoid sinus has been noted since 2011.  This may represent primary sphenoid sinus disease rather than extension of pituitary gland.  Attention to this on follow-up.  Left parietal scalp banding.  The patient had remote trauma this may be related to such.  IMPRESSION: No acute infarct.  In the medial superior right parietal lobe there is a 1 cm nonspecific cystic-appearing structure.  On the FLAIR sequence, there is increased signal within the adjacent sulci.  The constellation of findings is of indeterminate etiology.  Flow artifact as cause for the finding on FLAIR sequence is not excluded however, proteinaceous material or subarachnoid blood can cause a similar appearance.  This is not appreciated as acute hemorrhage on the recent performed  head CT.  Subacute subarachnoid hemorrhage not excluded. Proteinaceous material related to this cystic structure is a possibility.  In retrospect, this cystic structure is noted on the prior CTs with minimal if any change.  Contrast enhanced MR in all three planes with FLAIR coronal imaging can be obtained at the present time as a baseline examination.  Location of findings may explain the patient's left sided toe numbness.  Please see above.  Willette Cluster, MR technologist to call to the floor and arrange follow-up imaging.  Original Report Authenticated By: Doug Sou, M.D.   Mr [facility] Abdomen Wo Contrast  11/29/2011  *RADIOLOGY REPORT*  Clinical Data:  Elevated creatinine and evaluate for renal artery stenosis.  MRA ABDOMEN WITHOUT CONTRAST  Technique:  Multiplanar, multiecho pulse sequences of the abdomen were obtained without intravenous contrast.  Angiographic images of abdomen and pelvis were obtained using MRA technique without intravenous contrast.  Comparison:   None.  MRA ABDOMEN  Findings:  The left renal artery appears to be widely patent. There is a single main left renal artery.  There is evidence for an accessory superior right renal artery.  The main right renal artery is low lying but above the IMA.  The abdominal aorta is patent. There appears to be infrarenal atherosclerotic disease without evidence of aneurysmal dilatation.  The origin of the celiac trunk and SMA are patent.  There is a T2 bright structure in each kidney which probably represents cysts but limited evaluation on this MRA examination. Dependent subcutaneous edema in the flank regions.  Probable T2 bright cyst in the left hepatic lobe.  IMPRESSION: No evidence for renal artery stenosis.  Incidentally, the main right renal artery is low lying and there appears to be a superior right accessory renal artery.  Probable renal and hepatic cysts.  Original Report Authenticated By: Markus Daft, M.D.    BRIEF ADMITTING H & P: 78 year-old  female with history of hypertension and diabetes mellitus type 2 and migraine headaches who presented to  the ER with C/O recurrent  headaches after having been evaluated in the ED 3 days before for similar  comlaints. The headache was not associated with any focal neurological deficits.  During her evaluation in the ED,  patient was found to have a malignantly elevated  blood pressure. The patient was admitted for malignant HTN.  Please refer to the H&P for further details of history of present illness.  Hospital Course:  Present on Admission:  .Headache: Patient presented with a significant frontal headache rated 10 out of 10 on admission. She had an MRI of the brain done which shows findings consistent with possibly proteinaceous material. Neurology was consult who felt that this was a chronic finding but did recommend the patient should have an LP after being seen by the neurologist and having a followup MRI as an outpatient. With regard to the headache the neurologist felt that this might largely be secondary to muscle spasms and thus a trial of Robaxin was given. With the Robaxin the patient had almost complete resolution of her headaches. However given her age he does not recommended she take Robaxin a chronic nature and defer to her primary care physician as to do judicious prescription of muscle relaxants for this patient.   It is to be noted that the patient is also on meloxicam which can cause explosive headaches. However the patient does not know by whom or why she was placed on meloxicam. Recommendations the patient should stop meloxicam however I will defer to her primary care physician to determine whether or not there is an alternative medication that can be used in its place.   Alternatively he recommends that the patient should have physical therapy as an outpatient with direction given to an exercise program for stretching of the muscles as well as application of heat and massage  modalities.   .Accelerated hypertension: Patient had systolic blood pressures in excess of 200 on arrival to the hospital. She was on multiple medications including hydralazine 4 times a day by mouth. Her medications were adjusted to discontinue moexipril (due to acute renal failure) and hydralazine. Alternatively the patient was started on Procardia 25 mg by mouth daily and she was continued on her Inderal. At the time of discharge the patient's blood pressures are under good control with systolic blood pressures ranging in the 120s to 0000000, and diastolic blood pressures ranging in the 50s to the 60s.   .Acute renal failure: Upon arrival to the hospital the patient was noted to have a creatinine of 1.31 which was above her baseline creatinine of 0.75. The patient gave no history of excessive volume loss or poor oral intake to explain a prerenal state. However during the course of her hospitalization her creatinine escalated to a peak of 2.0. Her moexipril was discontinued and the patient was given judicious hydration. At the time of discharge the patient is having a downward trend of her creatinine down to 1.71 with a BUN of 30. I would recommend the patient have evaluation of her renal function on Tuesday, 12/05/2011 to ensure that her renal function is continuing to go in the right direction.  .Diabetes mellitus: Patient's hemoglobin A1c was found to be 6.4 which shows adequate but not optimal control of her diabetes. During his hospitalization she had no episodes of hypoglycemia and her blood sugars ranged in the 150s to 180s. Although the patient had decreased renal function she did not have any hypoglycemia and will likely need to have her medications further titrated when she reaches her plaques of renal function. I will defer to her primary care physician.   .Hypokalemia: Patient  had some mild hypokalemia while hospitalized. This was repleted orally she is now within normal limits at the time of  discharge.  .Nonspecific abnormal findings on radiological and examination of skull and head: As noted above the patient had some findings on the MRI of cystic appearing structure in the right parietal lobe with some adjacent proteinaceous-like material. Neurology was consulted who recommended the patient should followup as an outpatient with neurology and have a repeat MRI and possibly a lumbar puncture dependent upon evaluation of the neurologist at her followup visit.  Condition at the time of discharge: Good  Disposition and Follow-up: The patient is being discharged home and to followup with her primary care physician Dr. Tollie Pizza in 3 days. Patient also needs to see neurology as an outpatient and I will defer to Dr. Tollie Pizza to make that referral.   DISCHARGE EXAM:  General: Alert and pleasant. Feels that she is at her baseline. She reports that her headache is absent this morning. Vital Signs:Blood pressure 123/66, pulse 72, temperature 98.3 F (36.8 C), temperature source Oral, resp. rate 18, height 5\' 3"  (1.6 m), weight 68.992 kg (152 lb 1.6 oz), SpO2 96.00%. HEENT: [place]/AT PEERL, EOMI  Heart: Regular rate and rhythm.  Lungs: Clear to auscultation. There is no wheezing or rhonchi noted.  Abdomen: Obese, soft, nontender and nondistended.there are normoactive bowel sounds present.  Neuro: No focal neurological deficits noted cranial nerves II through XII grossly intact.  Musculoskeletal: No warm swelling or erythema around joints, no spinal tenderness noted. Patient demonstrates good mobility and has been granulating in the halls independently during her hospital stay. Extremities: No clubbing, cyanosis, or edema.    Basename 12/03/11 0608 12/02/11 0730  NA 142 140  K 3.7 4.1  CL 105 102  CO2 26 29  GLUCOSE 135* 87  BUN 30* 37*  CREATININE 1.71* 1.98*  CALCIUM 9.0 9.4  MG -- --  PHOS -- --   No results found for this basename: AST:2,ALT:2,ALKPHOS:2,BILITOT:2,PROT:2,ALBUMIN:2 in  the last 72 hours No results found for this basename: LIPASE:2,AMYLASE:2 in the last 72 hours No results found for this basename: WBC:2,NEUTROABS:2,HGB:2,HCT:2,MCV:2,PLT:2 in the last 72 hours  Total time for discharge process including face-to-face time approximately 45 minutes.  Signed: Vernell Back A. 12/03/2011, 11:34 AM

## 2011-12-20 ENCOUNTER — Encounter (HOSPITAL_COMMUNITY): Payer: Self-pay | Admitting: *Deleted

## 2011-12-20 ENCOUNTER — Observation Stay (HOSPITAL_COMMUNITY)
Admission: EM | Admit: 2011-12-20 | Discharge: 2011-12-22 | Disposition: A | Discharge status: Home / Self Care | Payer: Medicare Other | Attending: Internal Medicine | Admitting: Internal Medicine

## 2011-12-20 ENCOUNTER — Emergency Department (HOSPITAL_COMMUNITY): Payer: Medicare Other

## 2011-12-20 DIAGNOSIS — I10 Essential (primary) hypertension: Secondary | ICD-10-CM | POA: Diagnosis present

## 2011-12-20 DIAGNOSIS — E119 Type 2 diabetes mellitus without complications: Secondary | ICD-10-CM | POA: Diagnosis present

## 2011-12-20 DIAGNOSIS — R519 Headache, unspecified: Secondary | ICD-10-CM | POA: Diagnosis present

## 2011-12-20 DIAGNOSIS — R112 Nausea with vomiting, unspecified: Secondary | ICD-10-CM | POA: Insufficient documentation

## 2011-12-20 DIAGNOSIS — R930 Abnormal findings on diagnostic imaging of skull and head, not elsewhere classified: Secondary | ICD-10-CM | POA: Diagnosis present

## 2011-12-20 DIAGNOSIS — Z8659 Personal history of other mental and behavioral disorders: Secondary | ICD-10-CM

## 2011-12-20 DIAGNOSIS — R51 Headache: Principal | ICD-10-CM | POA: Insufficient documentation

## 2011-12-20 LAB — CBC WITH DIFFERENTIAL/PLATELET
Basophils Relative: 0 % (ref 0–1)
Hemoglobin: 10.6 g/dL — ABNORMAL LOW (ref 12.0–15.0)
Lymphs Abs: 1.5 10*3/uL (ref 0.7–4.0)
MCHC: 35.1 g/dL (ref 30.0–36.0)
Monocytes Relative: 10 % (ref 3–12)
Neutro Abs: 3.8 10*3/uL (ref 1.7–7.7)
Neutrophils Relative %: 64 % (ref 43–77)
RBC: 3.47 MIL/uL — ABNORMAL LOW (ref 3.87–5.11)

## 2011-12-20 LAB — BASIC METABOLIC PANEL
BUN: 20 mg/dL (ref 6–23)
Chloride: 99 mEq/L (ref 96–112)
GFR calc Af Amer: 49 mL/min — ABNORMAL LOW (ref >90)
Potassium: 3.4 mEq/L — ABNORMAL LOW (ref 3.5–5.1)
Sodium: 138 mEq/L (ref 135–145)

## 2011-12-20 MED ORDER — SODIUM CHLORIDE 0.9 % IV SOLN
INTRAVENOUS | Status: DC
  Administered 2011-12-20: 22:00:00 via INTRAVENOUS

## 2011-12-20 MED ORDER — KETOROLAC TROMETHAMINE 30 MG/ML IJ SOLN
30.0000 mg | Freq: Once | INTRAMUSCULAR | Status: AC
  Administered 2011-12-20: 30 mg via INTRAVENOUS
  Filled 2011-12-20: qty 1

## 2011-12-20 MED ORDER — MORPHINE SULFATE 4 MG/ML IJ SOLN
4.0000 mg | Freq: Once | INTRAMUSCULAR | Status: AC
  Administered 2011-12-20: 4 mg via INTRAVENOUS
  Filled 2011-12-20: qty 1

## 2011-12-20 MED ORDER — LABETALOL HCL 5 MG/ML IV SOLN
20.0000 mg | Freq: Once | INTRAVENOUS | Status: AC
  Administered 2011-12-20: 20 mg via INTRAVENOUS
  Filled 2011-12-20: qty 4

## 2011-12-20 MED ORDER — ONDANSETRON HCL 4 MG/2ML IJ SOLN
4.0000 mg | Freq: Once | INTRAMUSCULAR | Status: AC
  Administered 2011-12-20: 4 mg via INTRAVENOUS
  Filled 2011-12-20: qty 2

## 2011-12-20 MED ORDER — PROMETHAZINE HCL 25 MG/ML IJ SOLN
25.0000 mg | Freq: Once | INTRAMUSCULAR | Status: AC
  Administered 2011-12-20: 25 mg via INTRAVENOUS
  Filled 2011-12-20: qty 1

## 2011-12-20 NOTE — ED Notes (Signed)
To ED for eval of HA and HTN. Pt was admitted to the hospital about 2 wks ago for the same. Pt states 'they brought my blood pressure down and my ha came down too'. States she was discharged and felt normal with no pain for a cple of days. States they 'found something in my head and I have to see a neurosurgeon this Monday'. Pt is alert and oriented. Speech is clear. Nauseated.

## 2011-12-20 NOTE — ED Provider Notes (Addendum)
History     CSN: [id number]  Arrival date & time 12/20/11  [id number]   First MD Initiated Contact with Patient 12/20/11 2059      Chief Complaint  Patient presents with  . Headache    (Consider location/radiation/quality/duration/timing/severity/associated sxs/prior treatment) Patient is a 78 y.o. female presenting with headaches. The history is provided by the patient and medical records.  Headache  Associated symptoms include nausea and vomiting. Pertinent negatives include no fever, no palpitations and no shortness of breath.   the patient is a 78 year old, female, who complains of a severe headache, with nausea, and vomiting.  She says that her headache is worse when she lies down and at night.  She denies vision changes, weakness, neck pain, fever, or rash.  She does not have any pain anywhere else.  She has been having similar episodes frequently in the past and has had CAT scans and MRIs for evaluation.  She has a cyst in the parietal area of her head for which she is supposed to have a followup evaluation.  Recently, she was admitted to the hospital with similar symptoms.  Because her headache was refractory to treatment in the emergency department.  Past Medical History  Diagnosis Date  . Hypertension   . Diabetes mellitus   . Migraine   . Reflux   . Hyperlipidemia     Past Surgical History  Procedure Date  . Cholecystectomy   . Abdominal hysterectomy   . Appendectomy   . Hernia repair   . Cystectomy   . Cataract extraction     Family History  Problem Relation Age of Onset  . Diabetes type II Father     History  Substance Use Topics  . Smoking status: Never Smoker   . Smokeless tobacco: Not on file  . Alcohol Use: No    OB History    Grav Para Term Preterm Abortions TAB SAB Ect Mult Living                  Review of Systems  Constitutional: Negative for fever, chills and diaphoresis.  HENT: Negative for neck pain.   Eyes: Negative for pain and visual  disturbance.  Respiratory: Negative for cough and shortness of breath.   Cardiovascular: Negative for chest pain, palpitations and leg swelling.  Gastrointestinal: Positive for nausea and vomiting. Negative for abdominal pain.  Musculoskeletal: Negative for back pain.  Skin: Negative for rash.  Neurological: Positive for headaches. Negative for weakness and numbness.  Psychiatric/Behavioral: Negative for confusion.  All other systems reviewed and are negative.    Allergies  Prednisone and Wellbutrin  Home Medications   Current Outpatient Rx  Name Route Sig Dispense Refill  . ASCORBIC ACID 500 MG PO TABS Oral Take 500 mg by mouth daily.      . ASPIRIN 81 MG PO CHEW Oral Chew 81 mg by mouth daily.      . OCUVITE PO TABS Oral Take 1 tablet by mouth 2 (two) times daily.     Marland Kitchen CLONAZEPAM 1 MG PO TABS Oral Take 1 mg by mouth 2 (two) times daily. For anxiety    . DOCUSATE SODIUM 100 MG PO CAPS Oral Take 100 mg by mouth daily as needed. For constipation    . FENOFIBRATE 145 MG PO TABS Oral Take 145 mg by mouth daily.    Marland Kitchen FOLIC ACID 1 MG PO TABS Oral Take 1 mg by mouth 2 (two) times daily.      Marland Kitchen GLIMEPIRIDE  2 MG PO TABS Oral Take 2 mg by mouth daily before breakfast.      . HYDRALAZINE HCL 25 MG PO TABS Oral Take 25 mg by mouth 2 (two) times daily.    Marland Kitchen LISINOPRIL 10 MG PO TABS Oral Take 10 mg by mouth daily.    . MELOXICAM 15 MG PO TABS Oral Take 15 mg by mouth daily.    Creed Copper M PLUS PO TABS Oral Take 1 tablet by mouth daily.      Marland Kitchen NIFEDIPINE ER 30 MG PO TB24 Oral Take 1 tablet (30 mg total) by mouth daily. 30 tablet 0  . FISH OIL 1200 MG PO CAPS Oral Take 2 capsules by mouth daily.    Marland Kitchen OMEPRAZOLE 20 MG PO CPDR Oral Take 20 mg by mouth daily.      Marland Kitchen OVER THE COUNTER MEDICATION Oral Take 1 capsule by mouth daily as needed. Papaya enzyme    . PERPHENAZINE-AMITRIPTYLINE 2-25 MG PO TABS Oral Take 1 tablet by mouth daily.      Marland Kitchen PROPRANOLOL HCL 40 MG PO TABS Oral Take 40 mg by mouth 2  (two) times daily.    Marland Kitchen ROSUVASTATIN CALCIUM 10 MG PO TABS Oral Take 5 mg by mouth daily.     . TOPIRAMATE 25 MG PO CPSP Oral Take 50 mg by mouth at bedtime.     Marland Kitchen VITAMIN A PO Oral Take 1 tablet by mouth daily.    Marland Kitchen VITAMIN C 500 MG PO TABS Oral Take 500 mg by mouth daily.      Marland Kitchen VITAMIN E 400 UNITS PO CAPS Oral Take 400 Units by mouth daily.        BP 191/52  Pulse 67  Temp 98.6 F (37 C) (Oral)  Resp 14  SpO2 97%  Physical Exam  Nursing note and vitals reviewed. Constitutional: She is oriented to person, place, and time. She appears well-developed and well-nourished.       Uncomfortable appearing  HENT:  Head: Normocephalic and atraumatic.  Mouth/Throat: No oropharyngeal exudate.  Eyes: Conjunctivae and EOM are normal. Pupils are equal, round, and reactive to light.  Neck: Normal range of motion. Neck supple. No JVD present.       No bruits.  No nuchal rigidity  Cardiovascular: Normal rate.   No murmur heard. Pulmonary/Chest: Effort normal and breath sounds normal. She has no rales.  Abdominal: Soft. Bowel sounds are normal. She exhibits no distension. There is no tenderness.  Musculoskeletal: Normal range of motion. She exhibits no edema and no tenderness.  Neurological: She is alert and oriented to person, place, and time.  Skin: Skin is warm and dry.  Psychiatric: She has a normal mood and affect. Thought content normal.    ED Course  Procedures (including critical care time) 78 year old, female, with severe headache, and hypertension.  We will perform a CAT scan of her head to make sure.  She has not had an intracranial bleed, and perform blood tests.  I will give her medications to reduce her blood pressure, as well as control her headache.  Labs Reviewed  CBC WITH DIFFERENTIAL - Abnormal; Notable for the following:    RBC 3.47 (*)     Hemoglobin 10.6 (*)     HCT 30.2 (*)     All other components within normal limits  BASIC METABOLIC PANEL - Abnormal; Notable for  the following:    Potassium 3.4 (*)     Glucose, Bld 154 (*)  Creatinine, Ser 1.15 (*)     Calcium 11.3 (*)     GFR calc non Af Amer 42 (*)     GFR calc Af Amer 49 (*)     All other components within normal limits   Ct Head Wo Contrast  12/20/2011  *RADIOLOGY REPORT*  Clinical Data: Headache, hypertension.  CT HEAD WITHOUT CONTRAST  Technique:  Contiguous axial images were obtained from the base of the skull through the vertex without contrast.  Comparison: 11/29/2011 MRI  Findings: High right parietal cystic focus again noted, similar to recent MRI. Peripheral convex area of calcification along the posterior left calvarium on series 2 image 12 and 13 is unchanged. Otherwise, there is no evidence for acute hemorrhage, hydrocephalus, mass lesion, or abnormal extra-axial fluid collection.  No definite CT evidence for acute infarction. The visualized paranasal sinuses and mastoid air cells are predominately clear.  IMPRESSION: Unchanged cystic focus within the high right parietal lobe.  No definite CT evidence of acute intracranial abnormality.  Original Report Authenticated By: Suanne Marker, M.D.     No diagnosis found.  CRITICAL CARE Performed by: Elmer Picker   Total critical care time: 30 min  Critical care time was exclusive of separately billable procedures and treating other patients.  Critical care was necessary to treat or prevent imminent or life-threatening deterioration.  Critical care was time spent personally by me on the following activities: development of treatment plan with patient and/or surrogate as well as nursing, discussions with consultants, evaluation of patient's response to treatment, examination of patient, obtaining history from patient or surrogate, ordering and performing treatments and interventions, ordering and review of laboratory studies, ordering and review of radiographic studies, pulse oximetry and re-evaluation of patient's  condition.  11:44 PM Still in pain.   Will admit.]  12:21 AM Spoke with [facility].  He will admit  Pain meds Antiemetics Labetalol X2 Reviewed prior chart and test results  CRITICAL CARE Performed by: British Moyd P   Total critical care time: 30 min  Critical care time was exclusive of separately billable procedures and treating other patients.  Critical care was necessary to treat or prevent imminent or life-threatening deterioration.  Critical care was time spent personally by me on the following activities: development of treatment plan with patient and/or surrogate as well as nursing, discussions with consultants, evaluation of patient's response to treatment, examination of patient, obtaining history from patient or surrogate, ordering and performing treatments and interventions, ordering and review of laboratory studies, ordering and review of radiographic studies, pulse oximetry and re-evaluation of patient's condition.  MDM  HTN - improving HA - uncontrolled.   No signs of stroke or ams         Barbara Cower, MD 12/20/11 [id number]  Barbara Cower, MD 12/20/11 [id number]  Barbara Cower, MD 12/21/11 [id number]

## 2011-12-20 NOTE — ED Notes (Signed)
Return from ct scan.

## 2011-12-20 NOTE — ED Notes (Signed)
Pt states this is the worse HA of her life rates 10/10 states increases when laying down and at night. No relief with motrin at home. HA continuous for 2 weeks worse today.

## 2011-12-20 NOTE — ED Notes (Signed)
Patient transported to CT 

## 2011-12-21 ENCOUNTER — Observation Stay (HOSPITAL_COMMUNITY): Payer: Medicare Other

## 2011-12-21 ENCOUNTER — Encounter (HOSPITAL_COMMUNITY): Payer: Self-pay | Admitting: Internal Medicine

## 2011-12-21 LAB — CBC
HCT: 29.5 % — ABNORMAL LOW (ref 36.0–46.0)
Hemoglobin: 10.5 g/dL — ABNORMAL LOW (ref 12.0–15.0)
Hemoglobin: 10.1 g/dL — ABNORMAL LOW (ref 12.0–15.0)
MCV: 87.3 fL (ref 78.0–100.0)
Platelets: 274 10*3/uL (ref 150–400)
RBC: 3.54 MIL/uL — ABNORMAL LOW (ref 3.87–5.11)
RBC: 3.38 MIL/uL — ABNORMAL LOW (ref 3.87–5.11)
RDW: 13.1 % (ref 11.5–15.5)
WBC: 6 10*3/uL (ref 4.0–10.5)
WBC: 6.4 10*3/uL (ref 4.0–10.5)

## 2011-12-21 LAB — CREATININE, SERUM
GFR calc Af Amer: 45 mL/min — ABNORMAL LOW (ref >90)
GFR calc non Af Amer: 39 mL/min — ABNORMAL LOW (ref >90)

## 2011-12-21 LAB — GLUCOSE, CAPILLARY
Glucose-Capillary: 150 mg/dL — ABNORMAL HIGH (ref 70–99)
Glucose-Capillary: 159 mg/dL — ABNORMAL HIGH (ref 70–99)

## 2011-12-21 LAB — CARDIAC PANEL(CRET KIN+CKTOT+MB+TROPI)
Relative Index: INVALID (ref 0.0–2.5)
Total CK: 49 U/L (ref 7–177)
Troponin I: <0.3 ng/mL (ref <0.30)
Troponin I: <0.3 ng/mL (ref <0.30)
Troponin I: <0.3 ng/mL (ref <0.30)

## 2011-12-21 LAB — BASIC METABOLIC PANEL
CO2: 26 mEq/L (ref 19–32)
Calcium: 10.6 mg/dL — ABNORMAL HIGH (ref 8.4–10.5)
GFR calc non Af Amer: 44 mL/min — ABNORMAL LOW (ref >90)
Potassium: 3.5 mEq/L (ref 3.5–5.1)
Sodium: 139 mEq/L (ref 135–145)

## 2011-12-21 LAB — PRO B NATRIURETIC PEPTIDE: Pro B Natriuretic peptide (BNP): 2733 pg/mL — ABNORMAL HIGH (ref 0–450)

## 2011-12-21 MED ORDER — PROPRANOLOL HCL 40 MG PO TABS
40.0000 mg | ORAL_TABLET | Freq: Two times a day (BID) | ORAL | Status: DC
  Administered 2011-12-21 – 2011-12-22 (×3): 40 mg via ORAL
  Filled 2011-12-21 (×4): qty 1

## 2011-12-21 MED ORDER — DOCUSATE SODIUM 100 MG PO CAPS: 100.0000 mg | ORAL_CAPSULE | Freq: Every day | ORAL | Status: DC | PRN

## 2011-12-21 MED ORDER — ONDANSETRON HCL 4 MG/2ML IJ SOLN
4.0000 mg | Freq: Four times a day (QID) | INTRAMUSCULAR | Status: DC | PRN
  Administered 2011-12-21 (×2): 4 mg via INTRAVENOUS
  Filled 2011-12-21 (×2): qty 2

## 2011-12-21 MED ORDER — ASPIRIN 81 MG PO CHEW
81.0000 mg | CHEWABLE_TABLET | Freq: Every day | ORAL | Status: DC
  Administered 2011-12-22: 81 mg via ORAL
  Filled 2011-12-21: qty 1

## 2011-12-21 MED ORDER — LABETALOL HCL 5 MG/ML IV SOLN
10.0000 mg | INTRAVENOUS | Status: DC | PRN
  Administered 2011-12-21: 10 mg via INTRAVENOUS
  Filled 2011-12-21 (×3): qty 4

## 2011-12-21 MED ORDER — HYDROMORPHONE HCL PF 1 MG/ML IJ SOLN
1.0000 mg | INTRAMUSCULAR | Status: DC | PRN
  Administered 2011-12-21: 1 mg via INTRAVENOUS
  Filled 2011-12-21: qty 1

## 2011-12-21 MED ORDER — OMEGA-3-ACID ETHYL ESTERS 1 G PO CAPS
1.0000 g | ORAL_CAPSULE | Freq: Every day | ORAL | Status: DC
  Administered 2011-12-22: 1 g via ORAL
  Filled 2011-12-21 (×2): qty 1

## 2011-12-21 MED ORDER — DIPHENHYDRAMINE HCL 50 MG/ML IJ SOLN
12.5000 mg | Freq: Once | INTRAMUSCULAR | Status: AC
  Administered 2011-12-21: 12.5 mg via INTRAVENOUS
  Filled 2011-12-21: qty 1

## 2011-12-21 MED ORDER — ACETAMINOPHEN 650 MG RE SUPP: 650.0000 mg | Freq: Four times a day (QID) | RECTAL | Status: DC | PRN

## 2011-12-21 MED ORDER — MELOXICAM 15 MG PO TABS
15.0000 mg | ORAL_TABLET | Freq: Every day | ORAL | Status: DC
  Administered 2011-12-21 – 2011-12-22 (×2): 15 mg via ORAL
  Filled 2011-12-21 (×4): qty 1

## 2011-12-21 MED ORDER — PERPHENAZINE 2 MG PO TABS
2.0000 mg | ORAL_TABLET | Freq: Every day | ORAL | Status: DC
  Administered 2011-12-21 – 2011-12-22 (×2): 2 mg via ORAL
  Filled 2011-12-21 (×2): qty 1

## 2011-12-21 MED ORDER — CLONAZEPAM 1 MG PO TABS
1.0000 mg | ORAL_TABLET | Freq: Two times a day (BID) | ORAL | Status: DC
  Administered 2011-12-21 – 2011-12-22 (×2): 1 mg via ORAL
  Filled 2011-12-21 (×2): qty 1

## 2011-12-21 MED ORDER — OCUVITE PO TABS
1.0000 | ORAL_TABLET | Freq: Two times a day (BID) | ORAL | Status: DC
  Administered 2011-12-21 – 2011-12-22 (×2): 1 via ORAL
  Filled 2011-12-21 (×4): qty 1

## 2011-12-21 MED ORDER — LISINOPRIL 10 MG PO TABS
10.0000 mg | ORAL_TABLET | Freq: Every day | ORAL | Status: DC
  Filled 2011-12-21: qty 1

## 2011-12-21 MED ORDER — VITAMIN E 180 MG (400 UNIT) PO CAPS
400.0000 [IU] | ORAL_CAPSULE | Freq: Every day | ORAL | Status: DC
  Administered 2011-12-22: 400 [IU] via ORAL
  Filled 2011-12-21 (×2): qty 1

## 2011-12-21 MED ORDER — ATORVASTATIN CALCIUM 10 MG PO TABS
10.0000 mg | ORAL_TABLET | Freq: Every day | ORAL | Status: DC
  Administered 2011-12-21: 10 mg via ORAL
  Filled 2011-12-21 (×2): qty 1

## 2011-12-21 MED ORDER — SODIUM CHLORIDE 0.9 % IV SOLN
INTRAVENOUS | Status: DC
  Administered 2011-12-21 – 2011-12-22 (×2): via INTRAVENOUS

## 2011-12-21 MED ORDER — OXYCODONE HCL 5 MG PO TABS: 5.0000 mg | ORAL_TABLET | ORAL | Status: DC | PRN

## 2011-12-21 MED ORDER — AMITRIPTYLINE HCL 25 MG PO TABS
25.0000 mg | ORAL_TABLET | Freq: Every day | ORAL | Status: DC
  Administered 2011-12-21: 25 mg via ORAL
  Filled 2011-12-21 (×2): qty 1

## 2011-12-21 MED ORDER — CLONIDINE HCL 0.3 MG PO TABS
0.3000 mg | ORAL_TABLET | Freq: Once | ORAL | Status: AC
  Administered 2011-12-21: 0.3 mg via ORAL
  Filled 2011-12-21: qty 1

## 2011-12-21 MED ORDER — HYDRALAZINE HCL 50 MG PO TABS
100.0000 mg | ORAL_TABLET | Freq: Two times a day (BID) | ORAL | Status: DC
  Administered 2011-12-21: 100 mg via ORAL
  Filled 2011-12-21 (×3): qty 2

## 2011-12-21 MED ORDER — SENNOSIDES-DOCUSATE SODIUM 8.6-50 MG PO TABS: 1.0000 | ORAL_TABLET | Freq: Every evening | ORAL | Status: DC | PRN

## 2011-12-21 MED ORDER — ENOXAPARIN SODIUM 40 MG/0.4ML ~~LOC~~ SOLN
40.0000 mg | SUBCUTANEOUS | Status: DC
  Administered 2011-12-21 – 2011-12-22 (×2): 40 mg via SUBCUTANEOUS
  Filled 2011-12-21 (×2): qty 0.4

## 2011-12-21 MED ORDER — PANTOPRAZOLE SODIUM 40 MG PO TBEC
40.0000 mg | DELAYED_RELEASE_TABLET | Freq: Every day | ORAL | Status: DC
  Administered 2011-12-21 – 2011-12-22 (×2): 40 mg via ORAL
  Filled 2011-12-21 (×2): qty 1

## 2011-12-21 MED ORDER — NIFEDIPINE ER 30 MG PO TB24
30.0000 mg | ORAL_TABLET | Freq: Every day | ORAL | Status: DC
  Administered 2011-12-21 – 2011-12-22 (×2): 30 mg via ORAL
  Filled 2011-12-21 (×2): qty 1

## 2011-12-21 MED ORDER — GLIMEPIRIDE 2 MG PO TABS
2.0000 mg | ORAL_TABLET | Freq: Every day | ORAL | Status: DC
  Administered 2011-12-21 – 2011-12-22 (×2): 2 mg via ORAL
  Filled 2011-12-21 (×4): qty 1

## 2011-12-21 MED ORDER — ADULT MULTIVITAMIN W/MINERALS CH
1.0000 | ORAL_TABLET | Freq: Every day | ORAL | Status: DC
  Administered 2011-12-22: 1 via ORAL
  Filled 2011-12-21 (×2): qty 1

## 2011-12-21 MED ORDER — LISINOPRIL 10 MG PO TABS
10.0000 mg | ORAL_TABLET | Freq: Every day | ORAL | Status: DC
  Administered 2011-12-21 – 2011-12-22 (×2): 10 mg via ORAL
  Filled 2011-12-21 (×2): qty 1

## 2011-12-21 MED ORDER — ONDANSETRON HCL 4 MG PO TABS
4.0000 mg | ORAL_TABLET | Freq: Four times a day (QID) | ORAL | Status: DC | PRN
  Administered 2011-12-21: 4 mg via ORAL
  Filled 2011-12-21 (×2): qty 1

## 2011-12-21 MED ORDER — TOPIRAMATE 25 MG PO TABS
50.0000 mg | ORAL_TABLET | Freq: Every day | ORAL | Status: DC
  Administered 2011-12-21: 50 mg via ORAL
  Filled 2011-12-21 (×4): qty 2

## 2011-12-21 MED ORDER — PERPHENAZINE-AMITRIPTYLINE 2-25 MG PO TABS: 1.0000 | ORAL_TABLET | Freq: Every day | ORAL | Status: DC

## 2011-12-21 MED ORDER — MAGNESIUM GLUCONATE 500 MG PO TABS
1000.0000 mg | ORAL_TABLET | Freq: Once | ORAL | Status: AC
  Administered 2011-12-21: 1000 mg via ORAL
  Filled 2011-12-21: qty 2

## 2011-12-21 MED ORDER — PROCHLORPERAZINE EDISYLATE 5 MG/ML IJ SOLN
10.0000 mg | Freq: Once | INTRAMUSCULAR | Status: AC
  Administered 2011-12-21: 10 mg via INTRAVENOUS
  Filled 2011-12-21: qty 2

## 2011-12-21 MED ORDER — HYDRALAZINE HCL 25 MG PO TABS
25.0000 mg | ORAL_TABLET | Freq: Two times a day (BID) | ORAL | Status: DC
  Administered 2011-12-21: 25 mg via ORAL
  Filled 2011-12-21 (×2): qty 1

## 2011-12-21 MED ORDER — FENOFIBRATE 54 MG PO TABS
54.0000 mg | ORAL_TABLET | Freq: Every day | ORAL | Status: DC
  Administered 2011-12-22: 54 mg via ORAL
  Filled 2011-12-21 (×2): qty 1

## 2011-12-21 MED ORDER — VITAMIN C 500 MG PO TABS
500.0000 mg | ORAL_TABLET | Freq: Every day | ORAL | Status: DC
  Administered 2011-12-22: 500 mg via ORAL
  Filled 2011-12-21 (×2): qty 1

## 2011-12-21 MED ORDER — FOLIC ACID 1 MG PO TABS
1.0000 mg | ORAL_TABLET | Freq: Two times a day (BID) | ORAL | Status: DC
  Administered 2011-12-21 – 2011-12-22 (×2): 1 mg via ORAL
  Filled 2011-12-21 (×4): qty 1

## 2011-12-21 MED ORDER — ACETAMINOPHEN 325 MG PO TABS
650.0000 mg | ORAL_TABLET | Freq: Four times a day (QID) | ORAL | Status: DC | PRN
  Administered 2011-12-21: 650 mg via ORAL
  Filled 2011-12-21: qty 2

## 2011-12-21 MED ORDER — VITAMIN C 500 MG PO TABS: 500.0000 mg | ORAL_TABLET | Freq: Every day | ORAL | Status: DC

## 2011-12-21 NOTE — Progress Notes (Signed)
[facility] PROGRESS NOTE  KHOLE MEGA [id number] DOB: 1926-02-02 DOA: 12/20/2011 PCP: Stephens Shire, MD  Assessment/Plan: Principal Problem:  *Headache Active Problems:  Accelerated hypertension  Diabetes mellitus  Nonspecific abnormal findings on radiological and examination of skull and head  #1 persistent intractable headache: Consult neurology Dr. Doy Mince, Patient will be admitted for symptom control. Will consider IV Depakote if the conventional treatment fails. She is already on meloxicam, Topamax, Elavil. Patient will need urology consult in the morning to 5 advise on long-term strategy. She is on Topamax.  #2 isolated hypertension: Blood pressure responded to treatment in the ER. Added lisinopril, We will continue with IV labetalol as needed as well as home medications. Needs better control of her hypertension #3 diabetes: Patient has since controlled we will continue his home medicines plus sliding scale insulin.  #4 abnormal findings on MRI: We'll have neurology review films and advise accordingly.  #5 depression: Continue with Elavil and counseling   Code Status: full Family Communication: family updated about patient's clinical progress Disposition Plan:  As above    Brief narrative: 78 year old female with persistent headache hypertensive urgency and possibly intracerebral growth. Patient's headache is more than likely migraine that is persistent intractable     HPI/Subjective: Still complaining off 9/ 10 headache  Objective: Filed Vitals:   12/21/11 0201 12/21/11 0202 12/21/11 0537 12/21/11 0959  BP:  191/61 170/62 186/77  Pulse:  74 69 83  Temp:  98 F (36.7 C) 98.1 F (36.7 C) 98.1 F (36.7 C)  TempSrc:      Resp:  18 18 18   Height:      Weight:      SpO2: 99% 99% 97% 97%    Intake/Output Summary (Last 24 hours) at 12/21/11 1258 Last data filed at 12/21/11 0817  Gross per 24 hour  Intake      0 ml  Output    300 ml  Net   -300 ml     Exam:  HENT:  Head: Atraumatic.  Nose: Nose normal.  Mouth/Throat: Oropharynx is clear and moist.  Eyes: Conjunctivae are normal. Pupils are equal, round, and reactive to light. No scleral icterus.  Neck: Neck supple. No tracheal deviation present.  Cardiovascular: Normal rate, regular rhythm, normal heart sounds and intact distal pulses.  Pulmonary/Chest: Effort normal and breath sounds normal. No respiratory distress.  Abdominal: Soft. Normal appearance and bowel sounds are normal. She exhibits no distension. There is no tenderness.  Musculoskeletal: She exhibits no edema and no tenderness.  Neurological: She is alert. No cranial nerve deficit.    Data Reviewed: Basic Metabolic Panel:  Lab [id number] 0225 12/20/11 2138  NA 139 138  K 3.5 3.4*  CL 102 99  CO2 26 27  GLUCOSE 146* 154*  BUN 18 20  CREATININE 1.12* 1.15*  CALCIUM 10.6* 11.3*  MG -- --  PHOS -- --    Liver Function Tests: No results found for this basename: AST:5,ALT:5,ALKPHOS:5,BILITOT:5,PROT:5,ALBUMIN:5 in the last 168 hours No results found for this basename: LIPASE:5,AMYLASE:5 in the last 168 hours No results found for this basename: AMMONIA:5 in the last 168 hours  CBC:  Lab 12/21/11 0225 12/20/11 2138  WBC 6.0 6.0  NEUTROABS -- 3.8  HGB 10.5* 10.6*  HCT 30.5* 30.2*  MCV 86.2 87.0  PLT 274 318    Cardiac Enzymes:  Lab 12/21/11 0956 12/21/11 0204  CKTOTAL 49 59  CKMB 2.7 2.5  CKMBINDEX -- --  TROPONINI <0.30 <0.30   BNP (last 3  results)  Basename 12/21/11 0204  PROBNP 2733.0*     CBG:  Lab 12/21/11 1230 12/21/11 0809  GLUCAP 150* 156*    No results found for this or any previous visit (from the past 240 hour(s)).   Studies: Ct Head Wo Contrast  12/20/2011  *RADIOLOGY REPORT*  Clinical Data: Headache, hypertension.  CT HEAD WITHOUT CONTRAST  Technique:  Contiguous axial images were obtained from the base of the skull through the vertex without contrast.  Comparison:  11/29/2011 MRI  Findings: High right parietal cystic focus again noted, similar to recent MRI. Peripheral convex area of calcification along the posterior left calvarium on series 2 image 12 and 13 is unchanged. Otherwise, there is no evidence for acute hemorrhage, hydrocephalus, mass lesion, or abnormal extra-axial fluid collection.  No definite CT evidence for acute infarction. The visualized paranasal sinuses and mastoid air cells are predominately clear.  IMPRESSION: Unchanged cystic focus within the high right parietal lobe.  No definite CT evidence of acute intracranial abnormality.  Original Report Authenticated By: Suanne Marker, M.D.   Ct Head Wo Contrast  11/28/2011  *RADIOLOGY REPORT*  Clinical Data: Hypertension with headaches  CT HEAD WITHOUT CONTRAST  Technique:  Contiguous axial images were obtained from the base of the skull through the vertex without contrast.  Comparison: 11/11/2010  Findings: There is no evidence for acute infarction, intracranial hemorrhage, mass lesion, hydrocephalus, or extra-axial fluid. There is mild atrophy with chronic microvascular ischemic change. Vascular calcification is noted.  Moderate hyperostosis is seen without skull fracture or osseous destructive lesion.  There is no acute sinus or mastoid disease.  The appearance is similar compared with priors.  IMPRESSION: Age related atrophy and chronic microvascular ischemic change.  No intracranial hemorrhage or acute infarction.  Similar appearance to priors.  Original Report Authenticated By: Staci Righter, M.D.   Mri Brain Without Contrast  11/29/2011  **ADDENDUM** CREATED: 11/29/2011 12:54:39  Addendum should have stated that the FLAIR altered signal intensity is within the medial right parietal sulcus (and not within the parietal lobe).  Nonspecific 1 cm cystic structure is within the right parietal lobe and can be evaluated on follow-up MR in 6 months.  Results discussed with Dr. Zigmund Daniel.  **END ADDENDUM**  SIGNED BY: Doran Heater. Jeannine Kitten, M.D.   11/29/2011  **ADDENDUM** CREATED: 11/29/2011 12:21:17  The patient is not able to receive contrast secondary to poor renal function.  Coronal FLAIR imaging was obtained which confirms that the abnormality within the sulci of the medial right parietal lobe is a true finding rather than representing artifact.  This may represent subacute in subarachnoid blood or proteinaceous material.  It is difficult to make a connection with the right parietal intra-axial 1 cm cystic structure.  This latter structure can be evaluated on follow-up imaging to confirm stability.  To exclude possibility of subarachnoid blood or meningitis correlation with cerebrospinal fluid analysis would be necessary.  Cortical vein thrombosis contributing to the subarachnoid blood is a consideration.  Results called to Dr. Zigmund Daniel 11/29/2011 12:30 p.m.  **END ADDENDUM** SIGNED BY: Doran Heater. Jeannine Kitten, M.D.   11/29/2011  *RADIOLOGY REPORT*  Clinical Data: Persistent headache. Hypertension. Numbness left toe.  MRI HEAD WITHOUT CONTRAST  Technique:  Multiplanar, multiecho pulse sequences of the brain and surrounding structures were obtained according to standard protocol without intravenous contrast.  Comparison: 11/28/2011 head CT and 02/14/2010.  No comparison brain MR.  Findings: No acute infarct.  In the medial superior right parietal lobe there is a 1  cm nonspecific cystic-appearing structure.  On the FLAIR sequence, there is increased signal within the adjacent sulci.  The constellation of findings is of indeterminate etiology.  Flow artifact as cause for the finding on FLAIR sequence is not excluded however, proteinaceous material or subarachnoid blood can cause a similar appearance.  This is not appreciated as acute hemorrhage on the recent performed head CT.  Subacute subarachnoid hemorrhage not excluded. Proteinaceous material related to this cystic structure is a possibility.  In retrospect, this cystic  structure is noted on the prior CTs with minimal if any change.  Contrast enhanced MR in all three planes with FLAIR coronal imaging can be obtained at the present time as a baseline examination.  Location of findings may explain the patient's left sided toe numbness.  Hyperostosis frontalis interna incidentally noted.  Global age related atrophy without hydrocephalus.  Minimal white matter type changes consistent with result of small vessel disease.  Ectatic vertebral arteries and basilar artery.  Major intracranial vascular structures are patent.  Minimal partial opacification right mastoid air cells.  No mucosal thickening ethmoid sinus air cells and frontal sinuses.  Cerebellar tonsils slightly low-lying but within the range normal limits.  Transverse ligament hypertrophy.  Mild spinal stenosis cervical medullary junction.  Polypoid opacification below the sella extending into the dominant right sphenoid sinus has been noted since 2011.  This may represent primary sphenoid sinus disease rather than extension of pituitary gland.  Attention to this on follow-up.  Left parietal scalp banding.  The patient had remote trauma this may be related to such.  IMPRESSION: No acute infarct.  In the medial superior right parietal lobe there is a 1 cm nonspecific cystic-appearing structure.  On the FLAIR sequence, there is increased signal within the adjacent sulci.  The constellation of findings is of indeterminate etiology.  Flow artifact as cause for the finding on FLAIR sequence is not excluded however, proteinaceous material or subarachnoid blood can cause a similar appearance.  This is not appreciated as acute hemorrhage on the recent performed head CT.  Subacute subarachnoid hemorrhage not excluded. Proteinaceous material related to this cystic structure is a possibility.  In retrospect, this cystic structure is noted on the prior CTs with minimal if any change.  Contrast enhanced MR in all three planes with FLAIR  coronal imaging can be obtained at the present time as a baseline examination.  Location of findings may explain the patient's left sided toe numbness.  Please see above.  Willette Cluster, MR technologist to call to the floor and arrange follow-up imaging.  Original Report Authenticated By: Doug Sou, M.D.   [facility] Abdomen Wo Contrast  11/29/2011  *RADIOLOGY REPORT*  Clinical Data:  Elevated creatinine and evaluate for renal artery stenosis.  MRA ABDOMEN WITHOUT CONTRAST  Technique:  Multiplanar, multiecho pulse sequences of the abdomen were obtained without intravenous contrast.  Angiographic images of abdomen and pelvis were obtained using MRA technique without intravenous contrast.  Comparison:   None.  MRA ABDOMEN  Findings:  The left renal artery appears to be widely patent. There is a single main left renal artery.  There is evidence for an accessory superior right renal artery.  The main right renal artery is low lying but above the IMA.  The abdominal aorta is patent. There appears to be infrarenal atherosclerotic disease without evidence of aneurysmal dilatation.  The origin of the celiac trunk and SMA are patent.  There is a T2 bright structure in each kidney which probably represents  cysts but limited evaluation on this MRA examination. Dependent subcutaneous edema in the flank regions.  Probable T2 bright cyst in the left hepatic lobe.  IMPRESSION: No evidence for renal artery stenosis.  Incidentally, the main right renal artery is low lying and there appears to be a superior right accessory renal artery.  Probable renal and hepatic cysts.  Original Report Authenticated By: Markus Daft, M.D.    Scheduled Meds:   . perphenazine  2 mg Oral Daily   And  . amitriptyline  25 mg Oral QHS  . aspirin  81 mg Oral Daily  . atorvastatin  10 mg Oral q1800  . beta carotene w/minerals  1 tablet Oral BID  . clonazePAM  1 mg Oral BID  . enoxaparin  40 mg Subcutaneous Q24H  . fenofibrate  54 mg Oral Daily  .  folic acid  1 mg Oral BID  . glimepiride  2 mg Oral QAC breakfast  . hydrALAZINE  25 mg Oral BID  . ketorolac  30 mg Intravenous Once  . labetalol  20 mg Intravenous Once  . labetalol  20 mg Intravenous Once  . lisinopril  10 mg Oral Daily  . meloxicam  15 mg Oral Q breakfast  .  morphine injection  4 mg Intravenous Once  . multivitamin with minerals  1 tablet Oral Daily  . NIFEdipine  30 mg Oral Daily  . omega-3 acid ethyl esters  1 g Oral Daily  . ondansetron  4 mg Intravenous Once  . pantoprazole  40 mg Oral Q1200  . promethazine  25 mg Intravenous Once  . propranolol  40 mg Oral BID  . topiramate  50 mg Oral QHS  . vitamin C  500 mg Oral Daily  . vitamin E  400 Units Oral Daily  . DISCONTD: lisinopril  10 mg Oral Daily  . DISCONTD: perphenazine-amitriptyline  1 tablet Oral Daily  . DISCONTD: ascorbic acid  500 mg Oral Daily   Continuous Infusions:   . sodium chloride 100 mL/hr at 12/21/11 1029  . DISCONTD: sodium chloride 125 mL/hr at 12/20/11 2228    Principal Problem:  *Headache Active Problems:  Accelerated hypertension  Diabetes mellitus  Nonspecific abnormal findings on radiological and examination of skull and head    Time spent: 40 minutes   [facility] Pager [phone]. If 8PM-8AM, please contact night-coverage at [email or web address], password [facility] 12/21/2011, 12:58 PM  LOS: 1 day

## 2011-12-21 NOTE — Care Management Note (Signed)
  Page 1 of 1   12/21/2011     2:10:10 PM   CARE MANAGEMENT NOTE 12/21/2011  Patient:  Tamara Arnold, Tamara Arnold   Account Number:  [id number]  Date Initiated:  12/21/2011  Documentation initiated by:  Magdalen Spatz  Subjective/Objective Assessment:   DX: headache    CT head : cystic focus within the high right parietal lobe.     Action/Plan:   neurology consult   Anticipated DC Date:  12/22/2011   Anticipated DC Plan:           Choice offered to / List presented to:             Status of service:  In process, will continue to follow Medicare Important Message given?   (If response is "NO", the following Medicare IM given date fields will be blank) Date Medicare IM given:   Date Additional Medicare IM given:    Discharge Disposition:    Per UR Regulation:  Reviewed for med. necessity/level of care/duration of stay  If discussed at Success of Stay Meetings, dates discussed:    Comments:

## 2011-12-21 NOTE — Consult Note (Addendum)
Reason for Consult:Headahce Referring Physician: Reyne Dumas   CC: Headache  HPI: Tamara Arnold is an 78 y.o. female with a history of chronic headaches for approximately 30 years. Her headache is bifrontal, consisting of a pressure sensation. She notes that it is similar to her previous headaches, except that over the past few months they have been worse than they were previously. She has had nausea and vomiting. She has also noticed tingling sensation in her distal extremities. She notes that this sensation is much worse when she has a headache, but is present all the time. She reports that the headache has been a 10 out of 10 severity, although today it is decreased to a 9/10 severity.  Also of note, her blood pressure has been very elevated with a systolic as high as [id number].  She had a similar presentation about a month ago, her headache subsequently resolved and she was discharged. However a few days after this she had recurrence of her headache.    Past Medical History  Diagnosis Date  . Hypertension   . Diabetes mellitus   . Migraine   . Reflux   . Hyperlipidemia     Past Surgical History  Procedure Date  . Cholecystectomy   . Abdominal hysterectomy   . Appendectomy   . Hernia repair   . Cystectomy   . Cataract extraction     Family History  Problem Relation Age of Onset  . Diabetes type II Father     Social History:  reports that she has never smoked. She does not have any smokeless tobacco history on file. She reports that she does not drink alcohol or use illicit drugs.  Allergies  Allergen Reactions  . Prednisone Other (See Comments)    Headaches-all steroids  . Wellbutrin (Bupropion Hcl) Other (See Comments)    Caused headaches    Medications:  Scheduled:   . perphenazine  2 mg Oral Daily   And  . amitriptyline  25 mg Oral QHS  . aspirin  81 mg Oral Daily  . atorvastatin  10 mg Oral q1800  . beta carotene w/minerals  1 tablet Oral BID  .  clonazePAM  1 mg Oral BID  . enoxaparin  40 mg Subcutaneous Q24H  . fenofibrate  54 mg Oral Daily  . folic acid  1 mg Oral BID  . glimepiride  2 mg Oral QAC breakfast  . hydrALAZINE  25 mg Oral BID  . ketorolac  30 mg Intravenous Once  . labetalol  20 mg Intravenous Once  . labetalol  20 mg Intravenous Once  . lisinopril  10 mg Oral Daily  . meloxicam  15 mg Oral Q breakfast  .  morphine injection  4 mg Intravenous Once  . multivitamin with minerals  1 tablet Oral Daily  . NIFEdipine  30 mg Oral Daily  . omega-3 acid ethyl esters  1 g Oral Daily  . ondansetron  4 mg Intravenous Once  . pantoprazole  40 mg Oral Q1200  . promethazine  25 mg Intravenous Once  . propranolol  40 mg Oral BID  . topiramate  50 mg Oral QHS  . vitamin C  500 mg Oral Daily  . vitamin E  400 Units Oral Daily  . DISCONTD: lisinopril  10 mg Oral Daily  . DISCONTD: perphenazine-amitriptyline  1 tablet Oral Daily  . DISCONTD: ascorbic acid  500 mg Oral Daily   Continuous:   . sodium chloride 100 mL/hr at 12/21/11 1029  .  DISCONTD: sodium chloride 125 mL/hr at 12/20/11 2228   [id number], acetaminophen, docusate sodium, HYDROmorphone (DILAUDID) injection, labetalol, ondansetron (ZOFRAN) IV, ondansetron, oxyCODONE, senna-docusate  ROS: A complete 12 point review of systems is performed and is negative except as noted in history of present illness.  Physical Examination: Blood pressure 180/70, pulse 84, temperature 98.6 F (37 C), temperature source Oral, resp. rate 20, height 5\' 2"  (1.575 m), weight 63.2 kg (139 lb 5.3 oz), SpO2 97.00%.  Neurologic Examination Mental Status: Awake, alert, oriented to place, time and situation. She is able to give a clear and coherent history. Cranial Nerves: II-visual fields are intact, pupils equal round and reactive, difficult to visualize fundi due to pupil size. II/IV/VI-extraocular movements are intact V/VII-facial sensation is intact, face is  symmetric VIII-hearing is intact to voice IX/X-uvula elevates symmetrically XI-shoulder shrug is symmetric XII-tongue is midline Motor: Strength is 5 out of 5 throughout, with no distal to proximal variation, or pronator drift Sensory: Sensation is intact to light touch and proprioception, though the patient does endorse an  Odd sensation in her distal feet DTR's: 2+ and symmetric Cerebellar: Patient has a mild intentional tremor on finger nose finger, but does not notice it. Gait: Patient is able to stand unassisted, however is slightly lightheaded and therefore gait testing was not pursued  Laboratory Studies:   Basic Metabolic Panel:  Lab Q000111Q 1311 12/21/11 0225 12/20/11 2138  NA -- 139 138  K -- 3.5 3.4*  CL -- 102 99  CO2 -- 26 27  GLUCOSE -- 146* 154*  BUN -- 18 20  CREATININE 1.23* 1.12* 1.15*  CALCIUM -- 10.6* 11.3*  MG -- -- --  PHOS -- -- --    Liver Function Tests: No results found for this basename: AST:5,ALT:5,ALKPHOS:5,BILITOT:5,PROT:5,ALBUMIN:5 in the last 168 hours No results found for this basename: LIPASE:5,AMYLASE:5 in the last 168 hours No results found for this basename: AMMONIA:3 in the last 168 hours  CBC:  Lab 12/21/11 1311 12/21/11 0225 12/20/11 2138  WBC 6.4 6.0 6.0  NEUTROABS -- -- 3.8  HGB 10.1* 10.5* 10.6*  HCT 29.5* 30.5* 30.2*  MCV 87.3 86.2 87.0  PLT 280 274 318    Cardiac Enzymes:  Lab 12/21/11 0956 12/21/11 0204  CKTOTAL 49 59  CKMB 2.7 2.5  CKMBINDEX -- --  TROPONINI <0.30 <0.30    BNP: No components found with this basename: POCBNP:5  CBG:  Lab 12/21/11 1230 12/21/11 0809  GLUCAP 150* 156*    Microbiology: Results for orders placed during the hospital encounter of 11/02/10  MRSA PCR SCREENING     Status: Normal   Collection Time   11/03/10  4:17 AM      Component Value Range Status Comment   MRSA by PCR NEGATIVE  NEGATIVE Final     Coagulation Studies: No results found for this basename: LABPROT:5,INR:5 in  the last 72 hours  Urinalysis: No results found for this basename: COLORURINE:2,APPERANCEUR:2,LABSPEC:2,PHURINE:2,GLUCOSEU:2,HGBUR:2,BILIRUBINUR:2,KETONESUR:2,PROTEINUR:2,UROBILINOGEN:2,NITRITE:2,LEUKOCYTESUR:2 in the last 168 hours  Lipid Panel:     Component Value Date/Time   CHOL 131 11/29/2011 0736   TRIG 91 11/29/2011 0736   HDL 73 11/29/2011 0736   CHOLHDL 1.8 11/29/2011 0736   VLDL 18 11/29/2011 0736   LDLCALC 40 11/29/2011 0736    HgbA1C:  Lab Results  Component Value Date   HGBA1C 6.4* 11/29/2011    Urine Drug Screen:     Component Value Date/Time   LABOPIA NONE DETECTED 11/29/2011 0104   COCAINSCRNUR NONE DETECTED 11/29/2011 0104  LABBENZ NONE DETECTED 11/29/2011 0104   AMPHETMU NONE DETECTED 11/29/2011 0104   THCU NONE DETECTED 11/29/2011 0104   LABBARB NONE DETECTED 11/29/2011 0104    Alcohol Level: No results found for this basename: ETH:2 in the last 168 hours  Imaging: Ct Head Wo Contrast  12/20/2011  *RADIOLOGY REPORT*  Clinical Data: Headache, hypertension.  CT HEAD WITHOUT CONTRAST  Technique:  Contiguous axial images were obtained from the base of the skull through the vertex without contrast.  Comparison: 11/29/2011 MRI  Findings: High right parietal cystic focus again noted, similar to recent MRI. Peripheral convex area of calcification along the posterior left calvarium on series 2 image 12 and 13 is unchanged. Otherwise, there is no evidence for acute hemorrhage, hydrocephalus, mass lesion, or abnormal extra-axial fluid collection.  No definite CT evidence for acute infarction. The visualized paranasal sinuses and mastoid air cells are predominately clear.  IMPRESSION: Unchanged cystic focus within the high right parietal lobe.  No definite CT evidence of acute intracranial abnormality.  Original Report Authenticated By: Suanne Marker, M.D.     Assessment/Plan: 78 year old female with a thirty-year history of headaches who presents with worsening of her headache  that is similar in character to her previous headaches. Given that she does have nausea and vomiting associated with this headache which is not typical for her, I agree with pursuing vascular imaging to assess for venous sinus thrombosis. This has already been ordered by her primary physician. The tingling sensation in her extremities could be migraine aura given that it worsens with her headaches, but could represent a side effect of topiramate or be associated with her hypertensive urgency.   For treatment, and the current time I would suggest pursuing treatment has a typical migraine. She is oriented single dose of Phenergan, however I would repeat this in addition to giving 2 g magnesium. If this is not successful, then could try 500mg  IV depakote.  I would also continue to keep her hydrated as the team is doing with IV fluids given that she is not taking PO well.    Recommendations: 1) Agree with vascular imaging 2) Phenergan 25mg  IV x 1 accompanied by 12.5mg  IV benadryl and Magnesium 1 gm.  3) If no relief, then would treat with depakote 500mg  IV x 1.  4) Though the patient reports a worsening of headaches with steroids in the past, a prednisone dose pack can be helpfull in breaking status migrainosus, and would consider this if above treatments are not helping.   Roland Rack, MD [facility] [phone] 12/21/2011, 2:29 PM

## 2011-12-21 NOTE — H&P (Signed)
Tamara Arnold is an 78 y.o. female.   Chief Complaint: Headache HPI: An 78 year old female with known chronic migraine headaches for over 30 years who was recently admitted into the hospital early in July with severe headaches as well as hypertensive urgency. Patient's headache was controlled with combination of medications in the hospital including pain medicine as well as migraine medications. She was discharged home was doing fine for about a week but the headaches returned. Initially do at 5/10 she was taking a lot of Aleve to control them but no success. Today she is having a 10 out of 10 headache which has not responded to treatment in the emergency room. Her blood pressure was also elevated but not as high as it was earlier in July. She had a CT scan and MRI done in the month that showed some findings. It is not clear if this is a tumor or an artifact. She has not seen a neurologist since leaving the hospital.  Past Medical History  Diagnosis Date  . Hypertension   . Diabetes mellitus   . Migraine   . Reflux   . Hyperlipidemia     Past Surgical History  Procedure Date  . Cholecystectomy   . Abdominal hysterectomy   . Appendectomy   . Hernia repair   . Cystectomy   . Cataract extraction     Family History  Problem Relation Age of Onset  . Diabetes type II Father    Social History:  reports that she has never smoked. She does not have any smokeless tobacco history on file. She reports that she does not drink alcohol or use illicit drugs.  Allergies:  Allergies  Allergen Reactions  . Prednisone Other (See Comments)    Headaches-all steroids  . Wellbutrin (Bupropion Hcl) Other (See Comments)    Caused headaches    Medications Prior to Admission  Medication Sig Dispense Refill  . ascorbic acid (VITAMIN C) 500 MG tablet Take 500 mg by mouth daily.        Marland Kitchen aspirin 81 MG chewable tablet Chew 81 mg by mouth daily.        . beta carotene w/minerals (OCUVITE) tablet Take 1  tablet by mouth 2 (two) times daily.       . clonazePAM (KLONOPIN) 1 MG tablet Take 1 mg by mouth 2 (two) times daily. For anxiety      . docusate sodium (COLACE) 100 MG capsule Take 100 mg by mouth daily as needed. For constipation      . fenofibrate (TRICOR) 145 MG tablet Take 145 mg by mouth daily.      . folic acid (FOLVITE) 1 MG tablet Take 1 mg by mouth 2 (two) times daily.        Marland Kitchen glimepiride (AMARYL) 2 MG tablet Take 2 mg by mouth daily before breakfast.        . hydrALAZINE (APRESOLINE) 25 MG tablet Take 25 mg by mouth 2 (two) times daily.      Marland Kitchen lisinopril (PRINIVIL,ZESTRIL) 10 MG tablet Take 10 mg by mouth daily.      . meloxicam (MOBIC) 15 MG tablet Take 15 mg by mouth daily.      . Multiple Vitamins-Minerals (MULTIVITAMINS THER. W/MINERALS) TABS Take 1 tablet by mouth daily.        Marland Kitchen NIFEdipine (PROCARDIA-XL/ADALAT CC) 30 MG 24 hr tablet Take 1 tablet (30 mg total) by mouth daily.  30 tablet  0  . Omega-3 Fatty Acids (FISH OIL) 1200 MG  CAPS Take 2 capsules by mouth daily.      Marland Kitchen omeprazole (PRILOSEC) 20 MG capsule Take 20 mg by mouth daily.        Marland Kitchen OVER THE COUNTER MEDICATION Take 1 capsule by mouth daily as needed. Papaya enzyme      . perphenazine-amitriptyline (ETRAFON/TRIAVIL) 2-25 MG TABS Take 1 tablet by mouth daily.        . propranolol (INDERAL) 40 MG tablet Take 40 mg by mouth 2 (two) times daily.      . rosuvastatin (CRESTOR) 10 MG tablet Take 5 mg by mouth daily.       Marland Kitchen topiramate (TOPAMAX) 25 MG capsule Take 50 mg by mouth at bedtime.       Marland Kitchen VITAMIN A PO Take 1 tablet by mouth daily.      . vitamin C (ASCORBIC ACID) 500 MG tablet Take 500 mg by mouth daily.        . vitamin E (VITAMIN E) 400 UNIT capsule Take 400 Units by mouth daily.          Results for orders placed during the hospital encounter of 12/20/11 (from the past 48 hour(s))  CBC WITH DIFFERENTIAL     Status: Abnormal   Collection Time   12/20/11  9:38 PM      Component Value Range Comment   WBC 6.0   4.0 - 10.5 K/uL    RBC 3.47 (*) 3.87 - 5.11 MIL/uL    Hemoglobin 10.6 (*) 12.0 - 15.0 g/dL    HCT 30.2 (*) 36.0 - 46.0 %    MCV 87.0  78.0 - 100.0 fL    MCH 30.5  26.0 - 34.0 pg    MCHC 35.1  30.0 - 36.0 g/dL    RDW 12.9  11.5 - 15.5 %    Platelets 318  150 - 400 K/uL    Neutrophils Relative 64  43 - 77 %    Neutro Abs 3.8  1.7 - 7.7 K/uL    Lymphocytes Relative 25  12 - 46 %    Lymphs Abs 1.5  0.7 - 4.0 K/uL    Monocytes Relative 10  3 - 12 %    Monocytes Absolute 0.6  0.1 - 1.0 K/uL    Eosinophils Relative 2  0 - 5 %    Eosinophils Absolute 0.1  0.0 - 0.7 K/uL    Basophils Relative 0  0 - 1 %    Basophils Absolute 0.0  0.0 - 0.1 K/uL   BASIC METABOLIC PANEL     Status: Abnormal   Collection Time   12/20/11  9:38 PM      Component Value Range Comment   Sodium 138  135 - 145 mEq/L    Potassium 3.4 (*) 3.5 - 5.1 mEq/L    Chloride 99  96 - 112 mEq/L    CO2 27  19 - 32 mEq/L    Glucose, Bld 154 (*) 70 - 99 mg/dL    BUN 20  6 - 23 mg/dL    Creatinine, Ser 1.15 (*) 0.50 - 1.10 mg/dL    Calcium 11.3 (*) 8.4 - 10.5 mg/dL    GFR calc non Af Amer 42 (*) >90 mL/min    GFR calc Af Amer 49 (*) >90 mL/min   CARDIAC PANEL(CRET KIN+CKTOT+MB+TROPI)     Status: Normal   Collection Time   12/21/11  2:04 AM      Component Value Range Comment   Total CK 59  7 - 177 U/L    CK, MB 2.5  0.3 - 4.0 ng/mL    Troponin I <0.30  <0.30 ng/mL    Relative Index RELATIVE INDEX IS INVALID  0.0 - 2.5   PRO B NATRIURETIC PEPTIDE     Status: Abnormal   Collection Time   12/21/11  2:04 AM      Component Value Range Comment   Pro B Natriuretic peptide (BNP) 2733.0 (*) 0 - 450 pg/mL   BASIC METABOLIC PANEL     Status: Abnormal   Collection Time   12/21/11  2:25 AM      Component Value Range Comment   Sodium 139  135 - 145 mEq/L    Potassium 3.5  3.5 - 5.1 mEq/L    Chloride 102  96 - 112 mEq/L    CO2 26  19 - 32 mEq/L    Glucose, Bld 146 (*) 70 - 99 mg/dL    BUN 18  6 - 23 mg/dL    Creatinine, Ser 1.12 (*)  0.50 - 1.10 mg/dL    Calcium 10.6 (*) 8.4 - 10.5 mg/dL    GFR calc non Af Amer 44 (*) >90 mL/min    GFR calc Af Amer 51 (*) >90 mL/min   CBC     Status: Abnormal   Collection Time   12/21/11  2:25 AM      Component Value Range Comment   WBC 6.0  4.0 - 10.5 K/uL    RBC 3.54 (*) 3.87 - 5.11 MIL/uL    Hemoglobin 10.5 (*) 12.0 - 15.0 g/dL    HCT 30.5 (*) 36.0 - 46.0 %    MCV 86.2  78.0 - 100.0 fL    MCH 29.7  26.0 - 34.0 pg    MCHC 34.4  30.0 - 36.0 g/dL    RDW 12.8  11.5 - 15.5 %    Platelets 274  150 - 400 K/uL    Ct Head Wo Contrast  12/20/2011  *RADIOLOGY REPORT*  Clinical Data: Headache, hypertension.  CT HEAD WITHOUT CONTRAST  Technique:  Contiguous axial images were obtained from the base of the skull through the vertex without contrast.  Comparison: 11/29/2011 MRI  Findings: High right parietal cystic focus again noted, similar to recent MRI. Peripheral convex area of calcification along the posterior left calvarium on series 2 image 12 and 13 is unchanged. Otherwise, there is no evidence for acute hemorrhage, hydrocephalus, mass lesion, or abnormal extra-axial fluid collection.  No definite CT evidence for acute infarction. The visualized paranasal sinuses and mastoid air cells are predominately clear.  IMPRESSION: Unchanged cystic focus within the high right parietal lobe.  No definite CT evidence of acute intracranial abnormality.  Original Report Authenticated By: Suanne Marker, M.D.    Review of Systems  Constitutional: Positive for diaphoresis.  Eyes: Positive for blurred vision.  Respiratory: Negative.   Cardiovascular: Negative.   Gastrointestinal: Negative.   Genitourinary: Negative.   Musculoskeletal: Negative.   Skin: Negative.   Neurological: Positive for headaches.  Psychiatric/Behavioral: The patient has insomnia.     Blood pressure 170/62, pulse 69, temperature 98.1 F (36.7 C), temperature source Oral, resp. rate 18, height 5\' 2"  (1.575 m), weight 63.2 kg  (139 lb 5.3 oz), SpO2 97.00%. Physical Exam  Constitutional: She is oriented to person, place, and time. She appears well-developed and well-nourished.  HENT:  Head: Normocephalic and atraumatic.  Right Ear: External ear normal.  Left Ear: External ear normal.  Nose: Nose  normal.  Mouth/Throat: Oropharynx is clear and moist.  Eyes: Conjunctivae and EOM are normal. Pupils are equal, round, and reactive to light.  Neck: Normal range of motion. Neck supple.  Cardiovascular: Normal rate, regular rhythm, normal heart sounds and intact distal pulses.   Respiratory: Effort normal and breath sounds normal.  GI: Soft. Bowel sounds are normal.  Musculoskeletal: Normal range of motion.  Neurological: She is alert and oriented to person, place, and time. She has normal reflexes. She displays normal reflexes. No cranial nerve deficit. She exhibits abnormal muscle tone. Coordination normal.  Skin: Skin is warm and dry.  Psychiatric: She has a normal mood and affect. Her behavior is normal. Judgment and thought content normal.     Assessment/Plan An 78 year old female with persistent headache hypertensive urgency and possibly intracerebral growth. Patient's headache is more than likely migraine that is persistent intractable.  Plan #1 persistent intractable headache: Patient will be admitted for symptom control. Will consider IV Depakote if the conventional treatment fails. IV pain medicine is being tried. Patient will need urology consult in the morning to 5 advise on long-term strategy. She is on Topamax.  #2 isolated hypertension: Blood pressure responded to treatment in the ER. We will continue with IV labetalol as needed as well as home medications.  #3 diabetes: Patient has since controlled we will continue his home medicines plus sliding scale insulin.  #4 abnormal findings on MRI: We'll have neurology review films and advise accordingly.  #5 depression: Continue with Elavil and  counseling.  Kanden Carey,LAWAL 12/21/2011, 6:14 AM

## 2011-12-22 LAB — BASIC METABOLIC PANEL
BUN: 26 mg/dL — ABNORMAL HIGH (ref 6–23)
CO2: 26 mEq/L (ref 19–32)
Chloride: 106 mEq/L (ref 96–112)
GFR calc non Af Amer: 22 mL/min — ABNORMAL LOW (ref >90)
Glucose, Bld: 145 mg/dL — ABNORMAL HIGH (ref 70–99)
Potassium: 3.8 mEq/L (ref 3.5–5.1)
Sodium: 141 mEq/L (ref 135–145)

## 2011-12-22 LAB — GLUCOSE, CAPILLARY

## 2011-12-22 MED ORDER — ENOXAPARIN SODIUM 30 MG/0.3ML ~~LOC~~ SOLN: 30.0000 mg | SUBCUTANEOUS | Status: DC

## 2011-12-22 MED ORDER — DIVALPROEX SODIUM 250 MG PO DR TAB: 250.0000 mg | DELAYED_RELEASE_TABLET | Freq: Two times a day (BID) | ORAL | Status: DC

## 2011-12-22 MED ORDER — HYDRALAZINE HCL 25 MG PO TABS: 50.0000 mg | ORAL_TABLET | Freq: Two times a day (BID) | ORAL | Status: DC

## 2011-12-22 MED ORDER — GABAPENTIN 300 MG PO CAPS: ORAL_CAPSULE | ORAL | Status: DC

## 2011-12-22 MED ORDER — SODIUM CHLORIDE 0.9 % IV BOLUS (SEPSIS)
250.0000 mL | Freq: Once | INTRAVENOUS | Status: AC
  Administered 2011-12-22: 250 mL via INTRAVENOUS

## 2011-12-22 MED ORDER — HYDRALAZINE HCL 25 MG PO TABS
25.0000 mg | ORAL_TABLET | Freq: Three times a day (TID) | ORAL | Status: DC
Start: 2011-12-22 — End: 2011-12-22
  Filled 2011-12-22 (×3): qty 1

## 2011-12-22 NOTE — Progress Notes (Signed)
Subjective: Patient reports marked improvement in her headaches, she is eating and drinking well at this point. Of note, she takes daily analgesic medication(advil).   Objective: Current vital signs: BP 125/41  Pulse 83  Temp 98.1 F (36.7 C) (Oral)  Resp 20  Ht 5\' 2"  (1.575 m)  Wt 63.2 kg (139 lb 5.3 oz)  BMI 25.48 kg/m2  SpO2 95% Vital signs in last 24 hours: Temp:  [98.1 F (36.7 C)-98.7 F (37.1 C)] 98.1 F (36.7 C) (08/02 1007) Pulse Rate:  [62-96] 83  (08/02 1007) Resp:  [16-20] 20  (08/02 1007) BP: (90-195)/(31-71) 125/41 mmHg (08/02 1007) SpO2:  [95 %-98 %] 95 % (08/02 1007)  Intake/Output from previous day: 08/01 0701 - 08/02 0700 In: 2087 [P.O.:60; I.V.:2027] Out: 300 [Emesis/NG output:300] Intake/Output this shift: Total I/O In: 360 [P.O.:360] Out: -  Nutritional status: Cardiac  Gen: Appears comfortable.   Neurologic Exam: Awake, Alert, interactive and appropriate Mild distal paresthesia, but no clear sensory loss.   Lab Results: Basic Metabolic Panel:  Lab [id number] 12/21/11 1311 12/21/11 0225 12/20/11 2138  NA 141 -- 139 138  K 3.8 -- 3.5 3.4*  CL 106 -- 102 99  CO2 26 -- 26 27  GLUCOSE 145* -- 146* 154*  BUN 26* -- 18 20  CREATININE 1.96* 1.23* 1.12* 1.15*  CALCIUM 9.0 -- 10.6* 11.3*  MG -- -- -- --  PHOS -- -- -- --    Liver Function Tests: No results found for this basename: AST:5,ALT:5,ALKPHOS:5,BILITOT:5,PROT:5,ALBUMIN:5 in the last 168 hours No results found for this basename: LIPASE:5,AMYLASE:5 in the last 168 hours No results found for this basename: AMMONIA:3 in the last 168 hours  CBC:  Lab 12/21/11 1311 12/21/11 0225 12/20/11 2138  WBC 6.4 6.0 6.0  NEUTROABS -- -- 3.8  HGB 10.1* 10.5* 10.6*  HCT 29.5* 30.5* 30.2*  MCV 87.3 86.2 87.0  PLT 280 274 318    Cardiac Enzymes:  Lab 12/21/11 1820 12/21/11 0956 12/21/11 0204  CKTOTAL 50 49 59  CKMB 3.0 2.7 2.5  CKMBINDEX -- -- --  TROPONINI <0.30 <0.30 <0.30    Lipid  Panel: No results found for this basename: CHOL:5,TRIG:5,HDL:5,CHOLHDL:5,VLDL:5,LDLCALC:5 in the last 168 hours  CBG:  Lab 12/22/11 0841 12/22/11 0817 12/21/11 2121 12/21/11 1717 12/21/11 1230  GLUCAP 73 68* 124* 159* 150*    Microbiology:  Coagulation Studies:   Imaging: Ct Head Wo Contrast  12/20/2011  *RADIOLOGY REPORT*  Clinical Data: Headache, hypertension.  CT HEAD WITHOUT CONTRAST  Technique:  Contiguous axial images were obtained from the base of the skull through the vertex without contrast.  Comparison: 11/29/2011 MRI  Findings: High right parietal cystic focus again noted, similar to recent MRI. Peripheral convex area of calcification along the posterior left calvarium on series 2 image 12 and 13 is unchanged. Otherwise, there is no evidence for acute hemorrhage, hydrocephalus, mass lesion, or abnormal extra-axial fluid collection.  No definite CT evidence for acute infarction. The visualized paranasal sinuses and mastoid air cells are predominately clear.  IMPRESSION: Unchanged cystic focus within the high right parietal lobe.  No definite CT evidence of acute intracranial abnormality.  Original Report Authenticated By: Suanne Marker, M.D.   Mr Mra Head/brain Wo Cm  12/22/2011  *RADIOLOGY REPORT*  Clinical Data:  Headache.  Rule out thrombosis.  MRA HEAD WITHOUT CONTRAST  Technique:  Angiographic images of the Circle of Willis were obtained using MRA technique without intravenous contrast.  Comparison:  MRI head 11/29/2011, CT  head 12/20/2011  Findings:  Both vertebral arteries are widely patent.  Right PICA is patent.  Left AICA is patent.  Basilar artery is widely patent. Superior cerebellar arteries are patent bilaterally.  There is a moderate stenosis in the proximal right superior cerebellar artery. Both posterior cerebral arteries are patent with a moderate to severe stenosis in the mid to distal right posterior cerebral artery.  Internal carotid artery is patent bilaterally  without stenosis. Anterior and middle cerebral arteries are patent.  Mild stenosis in the parietal branch of the right middle cerebral artery.  No large vessel occlusion.  2 x 3 mm aneurysm projects into the cavernous sinus from the left internal carotid artery.  The aneurysm projects lateral to the left cavernous carotid.  No other aneurysm.  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  No large vessel occlusion.  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  *RADIOLOGY REPORT*  Clinical Data:  Headache.  Rule out thrombosis.  MRV HEAD WITHOUT CONTRAST  Technique:  Angiographic images of the intracranial venous structures were obtained using MRV technique without intravenous contrast.  Comparison:  MRI head 11/29/2011  Findings:  May the major dural sinuses are widely patent without stenosis or thrombosis.  The sagittal sinus, straight sinus, and sigmoid sinus is widely patent bilaterally.  IMPRESSION: Negative MR venogram of the head  Original Report Authenticated By: Truett Perna, M.D.   Mr Mrv Head Wo Cm  12/22/2011  *RADIOLOGY REPORT*  Clinical Data:  Headache.  Rule out thrombosis.  MRA HEAD WITHOUT CONTRAST  Technique:  Angiographic images of the Circle of Willis were obtained using MRA technique without intravenous contrast.  Comparison:  MRI head 11/29/2011, CT head 12/20/2011  Findings:  Both vertebral arteries are widely patent.  Right PICA is patent.  Left AICA is patent.  Basilar artery is widely patent. Superior cerebellar arteries are patent bilaterally.  There is a moderate stenosis in the proximal right superior cerebellar artery. Both posterior cerebral arteries are patent with a moderate to severe stenosis in the mid to distal right posterior cerebral artery.  Internal carotid artery is patent bilaterally without stenosis. Anterior and middle cerebral arteries are  patent.  Mild stenosis in the parietal branch of the right middle cerebral artery.  No large vessel occlusion.  2 x 3 mm aneurysm projects into the cavernous sinus from the left internal carotid artery.  The aneurysm projects lateral to the left cavernous carotid.  No other aneurysm.  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  No large vessel occlusion.  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  *RADIOLOGY REPORT*  Clinical Data:  Headache.  Rule out thrombosis.  MRV HEAD WITHOUT CONTRAST  Technique:  Angiographic images of the intracranial venous structures were obtained using MRV technique without intravenous contrast.  Comparison:  MRI head 11/29/2011  Findings:  May the major dural sinuses are widely patent without stenosis or thrombosis.  The sagittal sinus, straight sinus, and sigmoid sinus is widely patent bilaterally.  IMPRESSION: Negative MR venogram of the head  Original Report Authenticated By: Truett Perna, M.D.    Medications:  Scheduled:   . perphenazine  2 mg Oral Daily   And  . amitriptyline  25 mg Oral QHS  . aspirin  81 mg Oral Daily  . atorvastatin  10 mg Oral q1800  . beta carotene w/minerals  1 tablet Oral BID  . clonazePAM  1 mg Oral BID  . cloNIDine  0.3 mg Oral Once  . diphenhydrAMINE  12.5 mg Intravenous Once  . enoxaparin  40 mg Subcutaneous Q24H  . fenofibrate  54 mg Oral Daily  . folic acid  1 mg Oral BID  . glimepiride  2 mg Oral QAC breakfast  . hydrALAZINE  25 mg Oral Q8H  . lisinopril  10 mg Oral Daily  . magnesium gluconate  1,000 mg Oral Once  . meloxicam  15 mg Oral Q breakfast  . multivitamin with minerals  1 tablet Oral Daily  . NIFEdipine  30 mg Oral Daily  . omega-3 acid ethyl esters  1 g Oral Daily  . pantoprazole  40 mg Oral Q1200  . prochlorperazine  10 mg Intravenous Once  . propranolol  40 mg Oral BID  . sodium  chloride  250 mL Intravenous Once  . topiramate  50 mg Oral QHS  . vitamin C  500 mg Oral Daily  . vitamin E  400 Units Oral Daily  . DISCONTD: hydrALAZINE  100 mg Oral BID  . DISCONTD: hydrALAZINE  25 mg Oral BID  . DISCONTD: lisinopril  10 mg Oral Daily   [id number], acetaminophen, docusate sodium, HYDROmorphone (DILAUDID) injection, labetalol, ondansetron (ZOFRAN) IV, ondansetron, oxyCODONE, senna-docusate  Assessment/Plan: 78 yo F with longstanding headaches and acute exacerbation of her typical headaches, also with recent worsening of headachs. Her MRV did not reveal any clot and her MRI did not reveal any cause for her headaches, though her small aneurysm will need to be evaluated by neurosurgery as an outpatient, I do not believe it is related to her current presentation. HEr intracranial stenosis are likewise asymptomatic   1) Add gabapentin 300mg  PRN for headache 2) Referral as outpatient to vascular neurosurgery for small cavernous carotid follow up.  3) Continue other headache prophylaxis at this time.  4) Follow up with neurology as outpatient(patient reports that she has an appointment on Monday) 5) Will sign off at this time, please call the hospitalist pager at [phone] if any further questions arise.      LOS: 2 days   Roland Rack, MD [facility] [phone] 12/22/2011  10:42 AM

## 2011-12-22 NOTE — Progress Notes (Signed)
Discharge instructions reviewed with pt and pt's husband and prescriptions given.  Pt and husband verbalized understanding and questions answered.  Pt discharged in stable condition via wheelchair with husband.  Tamara Arnold

## 2011-12-22 NOTE — Progress Notes (Signed)
Text page NP, about blood pressure being low 90/36, received order and gave bolus of NS 250

## 2011-12-22 NOTE — Discharge Summary (Signed)
Physician Discharge Summary  Tamara Arnold MRN: [id number] DOB/AGE: Oct 07, 1925 78 y.o.  PCP: Stephens Shire, MD   Admit date: 12/20/2011 Discharge date: 12/22/2011  Discharge Diagnoses:     *Headache Rebound headache ?   Accelerated hypertension  Diabetes mellitus  Nonspecific abnormal findings on radiological and examination of skull and head   Medication List  As of 12/22/2011 10:49 AM   TAKE these medications         ascorbic acid 500 MG tablet   Commonly known as: VITAMIN C   Take 500 mg by mouth daily.      vitamin C 500 MG tablet   Commonly known as: ASCORBIC ACID   Take 500 mg by mouth daily.      aspirin 81 MG chewable tablet   Chew 81 mg by mouth daily.      beta carotene w/minerals tablet   Take 1 tablet by mouth 2 (two) times daily.      multivitamins ther. w/minerals Tabs   Take 1 tablet by mouth daily.      clonazePAM 1 MG tablet   Commonly known as: KLONOPIN   Take 1 mg by mouth 2 (two) times daily. For anxiety      docusate sodium 100 MG capsule   Commonly known as: COLACE   Take 100 mg by mouth daily as needed. For constipation      fenofibrate 145 MG tablet   Commonly known as: TRICOR   Take 145 mg by mouth daily.      Fish Oil 1200 MG Caps   Take 2 capsules by mouth daily.      folic acid 1 MG tablet   Commonly known as: FOLVITE   Take 1 mg by mouth 2 (two) times daily.      gabapentin 300 MG capsule   Commonly known as: NEURONTIN   300 mg tablets  Take up to 3 times a day, every 8 hours as needed for headache      glimepiride 2 MG tablet   Commonly known as: AMARYL   Take 2 mg by mouth daily before breakfast.      hydrALAZINE 25 MG tablet   Commonly known as: APRESOLINE   Take 2 tablets (50 mg total) by mouth 2 times daily at 12 noon and 4 pm.      lisinopril 10 MG tablet   Commonly known as: PRINIVIL,ZESTRIL   Take 10 mg by mouth daily.      meloxicam 15 MG tablet   Commonly known as: MOBIC   Take 15 mg by mouth  daily.      NIFEdipine 30 MG 24 hr tablet   Commonly known as: PROCARDIA-XL/ADALAT CC   Take 1 tablet (30 mg total) by mouth daily.      omeprazole 20 MG capsule   Commonly known as: PRILOSEC   Take 20 mg by mouth daily.      OVER THE COUNTER MEDICATION   Take 1 capsule by mouth daily as needed. Papaya enzyme      perphenazine-amitriptyline 2-25 MG Tabs   Commonly known as: ETRAFON/TRIAVIL   Take 1 tablet by mouth daily.      propranolol 40 MG tablet   Commonly known as: INDERAL   Take 40 mg by mouth 2 (two) times daily.      rosuvastatin 10 MG tablet   Commonly known as: CRESTOR   Take 5 mg by mouth daily.      topiramate 25 MG capsule  Commonly known as: TOPAMAX   Take 50 mg by mouth at bedtime.      VITAMIN A PO   Take 1 tablet by mouth daily.      vitamin E 400 UNIT capsule   Generic drug: vitamin E   Take 400 Units by mouth daily.            Discharge Condition: Table Disposition: 01-Home or Self Care   Consults:  #1 neurology  Significant Diagnostic Studies: Ct Head Wo Contrast  12/20/2011  *RADIOLOGY REPORT*  Clinical Data: Headache, hypertension.  CT HEAD WITHOUT CONTRAST  Technique:  Contiguous axial images were obtained from the base of the skull through the vertex without contrast.  Comparison: 11/29/2011 MRI  Findings: High right parietal cystic focus again noted, similar to recent MRI. Peripheral convex area of calcification along the posterior left calvarium on series 2 image 12 and 13 is unchanged. Otherwise, there is no evidence for acute hemorrhage, hydrocephalus, mass lesion, or abnormal extra-axial fluid collection.  No definite CT evidence for acute infarction. The visualized paranasal sinuses and mastoid air cells are predominately clear.  IMPRESSION: Unchanged cystic focus within the high right parietal lobe.  No definite CT evidence of acute intracranial abnormality.  Original Report Authenticated By: Suanne Marker, M.D.   Ct Head Wo  Contrast  11/28/2011  *RADIOLOGY REPORT*  Clinical Data: Hypertension with headaches  CT HEAD WITHOUT CONTRAST  Technique:  Contiguous axial images were obtained from the base of the skull through the vertex without contrast.  Comparison: 11/11/2010  Findings: There is no evidence for acute infarction, intracranial hemorrhage, mass lesion, hydrocephalus, or extra-axial fluid. There is mild atrophy with chronic microvascular ischemic change. Vascular calcification is noted.  Moderate hyperostosis is seen without skull fracture or osseous destructive lesion.  There is no acute sinus or mastoid disease.  The appearance is similar compared with priors.  IMPRESSION: Age related atrophy and chronic microvascular ischemic change.  No intracranial hemorrhage or acute infarction.  Similar appearance to priors.  Original Report Authenticated By: Staci Righter, M.D.   Mri Brain Without Contrast  11/29/2011  **ADDENDUM** CREATED: 11/29/2011 12:54:39  Addendum should have stated that the FLAIR altered signal intensity is within the medial right parietal sulcus (and not within the parietal lobe).  Nonspecific 1 cm cystic structure is within the right parietal lobe and can be evaluated on follow-up MR in 6 months.  Results discussed with Dr. Zigmund Daniel.  **END ADDENDUM** SIGNED BY: Doran Heater. Jeannine Kitten, M.D.   11/29/2011  **ADDENDUM** CREATED: 11/29/2011 12:21:17  The patient is not able to receive contrast secondary to poor renal function.  Coronal FLAIR imaging was obtained which confirms that the abnormality within the sulci of the medial right parietal lobe is a true finding rather than representing artifact.  This may represent subacute in subarachnoid blood or proteinaceous material.  It is difficult to make a connection with the right parietal intra-axial 1 cm cystic structure.  This latter structure can be evaluated on follow-up imaging to confirm stability.  To exclude possibility of subarachnoid blood or meningitis  correlation with cerebrospinal fluid analysis would be necessary.  Cortical vein thrombosis contributing to the subarachnoid blood is a consideration.  Results called to Dr. Zigmund Daniel 11/29/2011 12:30 p.m.  **END ADDENDUM** SIGNED BY: Doran Heater. Jeannine Kitten, M.D.   11/29/2011  *RADIOLOGY REPORT*  Clinical Data: Persistent headache. Hypertension. Numbness left toe.  MRI HEAD WITHOUT CONTRAST  Technique:  Multiplanar, multiecho pulse sequences of the brain and surrounding  structures were obtained according to standard protocol without intravenous contrast.  Comparison: 11/28/2011 head CT and 02/14/2010.  No comparison brain MR.  Findings: No acute infarct.  In the medial superior right parietal lobe there is a 1 cm nonspecific cystic-appearing structure.  On the FLAIR sequence, there is increased signal within the adjacent sulci.  The constellation of findings is of indeterminate etiology.  Flow artifact as cause for the finding on FLAIR sequence is not excluded however, proteinaceous material or subarachnoid blood can cause a similar appearance.  This is not appreciated as acute hemorrhage on the recent performed head CT.  Subacute subarachnoid hemorrhage not excluded. Proteinaceous material related to this cystic structure is a possibility.  In retrospect, this cystic structure is noted on the prior CTs with minimal if any change.  Contrast enhanced MR in all three planes with FLAIR coronal imaging can be obtained at the present time as a baseline examination.  Location of findings may explain the patient's left sided toe numbness.  Hyperostosis frontalis interna incidentally noted.  Global age related atrophy without hydrocephalus.  Minimal white matter type changes consistent with result of small vessel disease.  Ectatic vertebral arteries and basilar artery.  Major intracranial vascular structures are patent.  Minimal partial opacification right mastoid air cells.  No mucosal thickening ethmoid sinus air cells and  frontal sinuses.  Cerebellar tonsils slightly low-lying but within the range normal limits.  Transverse ligament hypertrophy.  Mild spinal stenosis cervical medullary junction.  Polypoid opacification below the sella extending into the dominant right sphenoid sinus has been noted since 2011.  This may represent primary sphenoid sinus disease rather than extension of pituitary gland.  Attention to this on follow-up.  Left parietal scalp banding.  The patient had remote trauma this may be related to such.  IMPRESSION: No acute infarct.  In the medial superior right parietal lobe there is a 1 cm nonspecific cystic-appearing structure.  On the FLAIR sequence, there is increased signal within the adjacent sulci.  The constellation of findings is of indeterminate etiology.  Flow artifact as cause for the finding on FLAIR sequence is not excluded however, proteinaceous material or subarachnoid blood can cause a similar appearance.  This is not appreciated as acute hemorrhage on the recent performed head CT.  Subacute subarachnoid hemorrhage not excluded. Proteinaceous material related to this cystic structure is a possibility.  In retrospect, this cystic structure is noted on the prior CTs with minimal if any change.  Contrast enhanced MR in all three planes with FLAIR coronal imaging can be obtained at the present time as a baseline examination.  Location of findings may explain the patient's left sided toe numbness.  Please see above.  Willette Cluster, MR technologist to call to the floor and arrange follow-up imaging.  Original Report Authenticated By: Doug Sou, M.D.   Mr [facility] Abdomen Wo Contrast  11/29/2011  *RADIOLOGY REPORT*  Clinical Data:  Elevated creatinine and evaluate for renal artery stenosis.  MRA ABDOMEN WITHOUT CONTRAST  Technique:  Multiplanar, multiecho pulse sequences of the abdomen were obtained without intravenous contrast.  Angiographic images of abdomen and pelvis were obtained using MRA technique  without intravenous contrast.  Comparison:   None.  MRA ABDOMEN  Findings:  The left renal artery appears to be widely patent. There is a single main left renal artery.  There is evidence for an accessory superior right renal artery.  The main right renal artery is low lying but above the IMA.  The abdominal  aorta is patent. There appears to be infrarenal atherosclerotic disease without evidence of aneurysmal dilatation.  The origin of the celiac trunk and SMA are patent.  There is a T2 bright structure in each kidney which probably represents cysts but limited evaluation on this MRA examination. Dependent subcutaneous edema in the flank regions.  Probable T2 bright cyst in the left hepatic lobe.  IMPRESSION: No evidence for renal artery stenosis.  Incidentally, the main right renal artery is low lying and there appears to be a superior right accessory renal artery.  Probable renal and hepatic cysts.  Original Report Authenticated By: Markus Daft, M.D.   Mr [facility] Head/brain Wo Cm  12/22/2011  *RADIOLOGY REPORT*  Clinical Data:  Headache.  Rule out thrombosis.  MRA HEAD WITHOUT CONTRAST  Technique:  Angiographic images of the Circle of Willis were obtained using MRA technique without intravenous contrast.  Comparison:  MRI head 11/29/2011, CT head 12/20/2011  Findings:  Both vertebral arteries are widely patent.  Right PICA is patent.  Left AICA is patent.  Basilar artery is widely patent. Superior cerebellar arteries are patent bilaterally.  There is a moderate stenosis in the proximal right superior cerebellar artery. Both posterior cerebral arteries are patent with a moderate to severe stenosis in the mid to distal right posterior cerebral artery.  Internal carotid artery is patent bilaterally without stenosis. Anterior and middle cerebral arteries are patent.  Mild stenosis in the parietal branch of the right middle cerebral artery.  No large vessel occlusion.  2 x 3 mm aneurysm projects into the cavernous sinus  from the left internal carotid artery.  The aneurysm projects lateral to the left cavernous carotid.  No other aneurysm.  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  No large vessel occlusion.  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  *RADIOLOGY REPORT*  Clinical Data:  Headache.  Rule out thrombosis.  MRV HEAD WITHOUT CONTRAST  Technique:  Angiographic images of the intracranial venous structures were obtained using MRV technique without intravenous contrast.  Comparison:  MRI head 11/29/2011  Findings:  May the major dural sinuses are widely patent without stenosis or thrombosis.  The sagittal sinus, straight sinus, and sigmoid sinus is widely patent bilaterally.  IMPRESSION: Negative MR venogram of the head  Original Report Authenticated By: Truett Perna, M.D.   Mr Mrv Head Wo Cm  12/22/2011  *RADIOLOGY REPORT*  Clinical Data:  Headache.  Rule out thrombosis.  MRA HEAD WITHOUT CONTRAST  Technique:  Angiographic images of the Circle of Willis were obtained using MRA technique without intravenous contrast.  Comparison:  MRI head 11/29/2011, CT head 12/20/2011  Findings:  Both vertebral arteries are widely patent.  Right PICA is patent.  Left AICA is patent.  Basilar artery is widely patent. Superior cerebellar arteries are patent bilaterally.  There is a moderate stenosis in the proximal right superior cerebellar artery. Both posterior cerebral arteries are patent with a moderate to severe stenosis in the mid to distal right posterior cerebral artery.  Internal carotid artery is patent bilaterally without stenosis. Anterior and middle cerebral arteries are patent.  Mild stenosis in the parietal branch of the right middle cerebral artery.  No large vessel occlusion.  2 x 3 mm aneurysm projects into the cavernous sinus from the left internal carotid artery.  The aneurysm projects  lateral to the left cavernous carotid.  No other aneurysm.  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  No large vessel occlusion.  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  *RADIOLOGY REPORT*  Clinical Data:  Headache.  Rule out thrombosis.  MRV HEAD WITHOUT CONTRAST  Technique:  Angiographic images of the intracranial venous structures were obtained using MRV technique without intravenous contrast.  Comparison:  MRI head 11/29/2011  Findings:  May the major dural sinuses are widely patent without stenosis or thrombosis.  The sagittal sinus, straight sinus, and sigmoid sinus is widely patent bilaterally.  IMPRESSION: Negative MR venogram of the head  Original Report Authenticated By: Truett Perna, M.D.      Microbiology: No results found for this or any previous visit (from the past 240 hour(s)).   Labs: Results for orders placed during the hospital encounter of 12/20/11 (from the past 48 hour(s))  CBC WITH DIFFERENTIAL     Status: Abnormal   Collection Time   12/20/11  9:38 PM      Component Value Range Comment   WBC 6.0  4.0 - 10.5 K/uL    RBC 3.47 (*) 3.87 - 5.11 MIL/uL    Hemoglobin 10.6 (*) 12.0 - 15.0 g/dL    HCT 30.2 (*) 36.0 - 46.0 %    MCV 87.0  78.0 - 100.0 fL    MCH 30.5  26.0 - 34.0 pg    MCHC 35.1  30.0 - 36.0 g/dL    RDW 12.9  11.5 - 15.5 %    Platelets 318  150 - 400 K/uL    Neutrophils Relative 64  43 - 77 %    Neutro Abs 3.8  1.7 - 7.7 K/uL    Lymphocytes Relative 25  12 - 46 %    Lymphs Abs 1.5  0.7 - 4.0 K/uL    Monocytes Relative 10  3 - 12 %    Monocytes Absolute 0.6  0.1 - 1.0 K/uL    Eosinophils Relative 2  0 - 5 %    Eosinophils Absolute 0.1  0.0 - 0.7 K/uL    Basophils Relative 0  0 - 1 %    Basophils Absolute 0.0  0.0 - 0.1 K/uL   BASIC METABOLIC PANEL     Status: Abnormal   Collection Time   12/20/11  9:38 PM      Component  Value Range Comment   Sodium 138  135 - 145 mEq/L    Potassium 3.4 (*) 3.5 - 5.1 mEq/L    Chloride 99  96 - 112 mEq/L    CO2 27  19 - 32 mEq/L    Glucose, Bld 154 (*) 70 - 99 mg/dL    BUN 20  6 - 23 mg/dL    Creatinine, Ser 1.15 (*) 0.50 - 1.10 mg/dL    Calcium 11.3 (*) 8.4 - 10.5 mg/dL    GFR calc non Af Amer 42 (*) >90 mL/min    GFR calc Af Amer 49 (*) >90 mL/min   CARDIAC PANEL(CRET KIN+CKTOT+MB+TROPI)     Status: Normal   Collection Time   12/21/11  2:04 AM      Component Value Range Comment   Total CK 59  7 - 177 U/L    CK, MB 2.5  0.3 - 4.0 ng/mL    Troponin I <0.30  <0.30 ng/mL    Relative Index RELATIVE INDEX IS INVALID  0.0 - 2.5   PRO B NATRIURETIC  PEPTIDE     Status: Abnormal   Collection Time   12/21/11  2:04 AM      Component Value Range Comment   Pro B Natriuretic peptide (BNP) 2733.0 (*) 0 - 450 pg/mL   TSH     Status: Normal   Collection Time   12/21/11  2:05 AM      Component Value Range Comment   TSH 2.147  0.350 - 4.500 uIU/mL   BASIC METABOLIC PANEL     Status: Abnormal   Collection Time   12/21/11  2:25 AM      Component Value Range Comment   Sodium 139  135 - 145 mEq/L    Potassium 3.5  3.5 - 5.1 mEq/L    Chloride 102  96 - 112 mEq/L    CO2 26  19 - 32 mEq/L    Glucose, Bld 146 (*) 70 - 99 mg/dL    BUN 18  6 - 23 mg/dL    Creatinine, Ser 1.12 (*) 0.50 - 1.10 mg/dL    Calcium 10.6 (*) 8.4 - 10.5 mg/dL    GFR calc non Af Amer 44 (*) >90 mL/min    GFR calc Af Amer 51 (*) >90 mL/min   CBC     Status: Abnormal   Collection Time   12/21/11  2:25 AM      Component Value Range Comment   WBC 6.0  4.0 - 10.5 K/uL    RBC 3.54 (*) 3.87 - 5.11 MIL/uL    Hemoglobin 10.5 (*) 12.0 - 15.0 g/dL    HCT 30.5 (*) 36.0 - 46.0 %    MCV 86.2  78.0 - 100.0 fL    MCH 29.7  26.0 - 34.0 pg    MCHC 34.4  30.0 - 36.0 g/dL    RDW 12.8  11.5 - 15.5 %    Platelets 274  150 - 400 K/uL   GLUCOSE, CAPILLARY     Status: Abnormal   Collection Time   12/21/11  8:09 AM      Component  Value Range Comment   Glucose-Capillary 156 (*) 70 - 99 mg/dL    Comment 1 Notify RN     CARDIAC PANEL(CRET KIN+CKTOT+MB+TROPI)     Status: Normal   Collection Time   12/21/11  9:56 AM      Component Value Range Comment   Total CK 49  7 - 177 U/L    CK, MB 2.7  0.3 - 4.0 ng/mL    Troponin I <0.30  <0.30 ng/mL    Relative Index RELATIVE INDEX IS INVALID  0.0 - 2.5   GLUCOSE, CAPILLARY     Status: Abnormal   Collection Time   12/21/11 12:30 PM      Component Value Range Comment   Glucose-Capillary 150 (*) 70 - 99 mg/dL    Comment 1 Notify RN     CBC     Status: Abnormal   Collection Time   12/21/11  1:11 PM      Component Value Range Comment   WBC 6.4  4.0 - 10.5 K/uL    RBC 3.38 (*) 3.87 - 5.11 MIL/uL    Hemoglobin 10.1 (*) 12.0 - 15.0 g/dL    HCT 29.5 (*) 36.0 - 46.0 %    MCV 87.3  78.0 - 100.0 fL    MCH 29.9  26.0 - 34.0 pg    MCHC 34.2  30.0 - 36.0 g/dL    RDW 13.1  11.5 - 15.5 %  Platelets 280  150 - 400 K/uL   CREATININE, SERUM     Status: Abnormal   Collection Time   12/21/11  1:11 PM      Component Value Range Comment   Creatinine, Ser 1.23 (*) 0.50 - 1.10 mg/dL    GFR calc non Af Amer 39 (*) >90 mL/min    GFR calc Af Amer 45 (*) >90 mL/min   GLUCOSE, CAPILLARY     Status: Abnormal   Collection Time   12/21/11  5:17 PM      Component Value Range Comment   Glucose-Capillary 159 (*) 70 - 99 mg/dL    Comment 1 Notify RN     CARDIAC PANEL(CRET KIN+CKTOT+MB+TROPI)     Status: Normal   Collection Time   12/21/11  6:20 PM      Component Value Range Comment   Total CK 50  7 - 177 U/L    CK, MB 3.0  0.3 - 4.0 ng/mL    Troponin I <0.30  <0.30 ng/mL    Relative Index RELATIVE INDEX IS INVALID  0.0 - 2.5   GLUCOSE, CAPILLARY     Status: Abnormal   Collection Time   12/21/11  9:21 PM      Component Value Range Comment   Glucose-Capillary 124 (*) 70 - 99 mg/dL   GLUCOSE, CAPILLARY     Status: Abnormal   Collection Time   12/22/11  8:17 AM      Component Value Range Comment    Glucose-Capillary 68 (*) 70 - 99 mg/dL    Comment 1 Notify RN     GLUCOSE, CAPILLARY     Status: Normal   Collection Time   12/22/11  8:41 AM      Component Value Range Comment   Glucose-Capillary 73  70 - 99 mg/dL    Comment 1 Notify RN     BASIC METABOLIC PANEL     Status: Abnormal   Collection Time   12/22/11  9:10 AM      Component Value Range Comment   Sodium 141  135 - 145 mEq/L    Potassium 3.8  3.5 - 5.1 mEq/L    Chloride 106  96 - 112 mEq/L    CO2 26  19 - 32 mEq/L    Glucose, Bld 145 (*) 70 - 99 mg/dL    BUN 26 (*) 6 - 23 mg/dL    Creatinine, Ser 1.96 (*) 0.50 - 1.10 mg/dL DELTA CHECK NOTED   Calcium 9.0  8.4 - 10.5 mg/dL    GFR calc non Af Amer 22 (*) >90 mL/min    GFR calc Af Amer 26 (*) >90 mL/min      HPI :SAMARY GOGGINS is an 78 y.o. female with a history of chronic headaches for approximately 30 years. Her headache is bifrontal, consisting of a pressure sensation. She notes that it is similar to her previous headaches, except that over the past few months they have been worse than they were previously. She has had nausea and vomiting. She has also noticed tingling sensation in her distal extremities. She notes that this sensation is much worse when she has a headache, but is present all the time. She reports that the headache has been a 10 out of 10 severity, although today it is decreased to a 9/10 severity.  Also of note, her blood pressure has been very elevated with a systolic as high as [id number].  She had a similar presentation about a month  ago, her headache subsequently resolved and she was discharged. However a few days after    HOSPITAL COURSE:   #1 headache with a component of rebound headaches, MRI on July 10 was negative MRA and MRV of the brain was negative, this was done during this admission Patient received one dose of Phenergan 25mg  IV x 1 accompanied by 12.5mg  IV benadryl and Magnesium 1 gm Discussed with Dr.McNeill Leonel Ramsay He recommends gabapentin 300  mg every 8 hours for breakthrough headaches It is possible that some of her headaches are because of  hypertensive urgency and that a better blood pressure control is indicated Recommend outpatient followup with neurology   #2 hypertension hydralazine has been increased to 50 mg by mouth twice a day, she will continue nifedipine  #3 diabetes the patient will continue glimepiride   Discharge Exam:  Blood pressure 125/41, pulse 83, temperature 98.1 F (36.7 C), temperature source Oral, resp. rate 20, height 5\' 2"  (1.575 m), weight 63.2 kg (139 lb 5.3 oz), SpO2 95.00%.   Neurologic Examination  Mental Status: Awake, alert, oriented to place, time and situation. She is able to give a clear and coherent history.  Cranial Nerves:  II-visual fields are intact, pupils equal round and reactive, difficult to visualize fundi due to pupil size.  II/IV/VI-extraocular movements are intact  V/VII-facial sensation is intact, face is symmetric  VIII-hearing is intact to voice  IX/X-uvula elevates symmetrically  XI-shoulder shrug is symmetric  XII-tongue is midline  Motor: Strength is 5 out of 5 throughout, with no distal to proximal variation, or pronator drift  Sensory: Sensation is intact to light touch and proprioception, though the patient does endorse an Odd sensation in her distal feet  DTR's: 2+ and symmetric  Cerebellar: Patient has a mild intentional tremor on finger nose finger, but does not notice it.  Gait: Patient is able to stand unassisted, however is slightly lightheaded and therefore gait testing was not pursued     Follow-up Information    Follow up with BURNETT,BRENT A, MD. Schedule an appointment as soon as possible for a visit in 1 week. (Posthospitalization followup)    Contact information:   [address] [place]       Follow up with Neurology. Schedule an appointment as soon as possible for a visit in 2 weeks.          SignedReyne Dumas 12/22/2011, 10:49 AM

## 2011-12-22 NOTE — Progress Notes (Signed)
Hypoglycemic Event  CBG: 68  Treatment: 15 GM carbohydrate snack, pt ate breakfast tray  Symptoms: None  Follow-up CBG: Time: 0841 CBG Result: 73  Possible Reasons for Event: Unknown  Comments/MD notified: Dr. Allyson Sabal notified    Yvonna Alanis  Remember to initiate Hypoglycemia Order Set & complete

## 2011-12-22 NOTE — Progress Notes (Signed)
Pt's BP 112/48.  MD notified and ordered to hold Hydralazine, but give pt instructions on new dose of Hydralazine at discharge.  Tamara Arnold

## 2011-12-22 NOTE — Progress Notes (Signed)
PHARMACIST - PHYSICIAN COMMUNICATION DR:   Ezzie Dural.  CONCERNING:  78yo female with CrCl < 54ml/min   RECOMMENDATION: 1.  I have adjusted Lovenox dosing to 30mg  SQ q24, recommended dose for Cl < 30 2.  Consider changing Meloxicam to 7.5mg  daily   Gracy Bruins, PharmD Clinical Pharmacist [facility]

## 2011-12-22 NOTE — Progress Notes (Signed)
Notified NP of BP 98/35 after NS bolus. No further orders received.

## 2012-01-23 ENCOUNTER — Emergency Department (HOSPITAL_COMMUNITY): Payer: Medicare Other

## 2012-01-23 ENCOUNTER — Encounter (HOSPITAL_COMMUNITY): Payer: Self-pay | Admitting: *Deleted

## 2012-01-23 ENCOUNTER — Inpatient Hospital Stay (HOSPITAL_COMMUNITY)
Admission: EM | Admit: 2012-01-23 | Discharge: 2012-01-25 | DRG: 305 | Disposition: A | Discharge status: Home / Self Care | Payer: Medicare Other | Attending: Internal Medicine | Admitting: Internal Medicine

## 2012-01-23 DIAGNOSIS — D649 Anemia, unspecified: Secondary | ICD-10-CM | POA: Diagnosis present

## 2012-01-23 DIAGNOSIS — I10 Essential (primary) hypertension: Principal | ICD-10-CM | POA: Diagnosis present

## 2012-01-23 DIAGNOSIS — J90 Pleural effusion, not elsewhere classified: Secondary | ICD-10-CM | POA: Diagnosis present

## 2012-01-23 DIAGNOSIS — Z79899 Other long term (current) drug therapy: Secondary | ICD-10-CM

## 2012-01-23 DIAGNOSIS — Z91199 Patient's noncompliance with other medical treatment and regimen due to unspecified reason: Secondary | ICD-10-CM

## 2012-01-23 DIAGNOSIS — T460X5A Adverse effect of cardiac-stimulant glycosides and drugs of similar action, initial encounter: Secondary | ICD-10-CM | POA: Diagnosis present

## 2012-01-23 DIAGNOSIS — Z7982 Long term (current) use of aspirin: Secondary | ICD-10-CM

## 2012-01-23 DIAGNOSIS — Z8659 Personal history of other mental and behavioral disorders: Secondary | ICD-10-CM

## 2012-01-23 DIAGNOSIS — K5900 Constipation, unspecified: Secondary | ICD-10-CM | POA: Diagnosis present

## 2012-01-23 DIAGNOSIS — R930 Abnormal findings on diagnostic imaging of skull and head, not elsewhere classified: Secondary | ICD-10-CM

## 2012-01-23 DIAGNOSIS — Z888 Allergy status to other drugs, medicaments and biological substances status: Secondary | ICD-10-CM

## 2012-01-23 DIAGNOSIS — K5909 Other constipation: Secondary | ICD-10-CM | POA: Diagnosis present

## 2012-01-23 DIAGNOSIS — G43909 Migraine, unspecified, not intractable, without status migrainosus: Secondary | ICD-10-CM

## 2012-01-23 DIAGNOSIS — Y92009 Unspecified place in unspecified non-institutional (private) residence as the place of occurrence of the external cause: Secondary | ICD-10-CM

## 2012-01-23 DIAGNOSIS — E119 Type 2 diabetes mellitus without complications: Secondary | ICD-10-CM | POA: Diagnosis present

## 2012-01-23 DIAGNOSIS — E785 Hyperlipidemia, unspecified: Secondary | ICD-10-CM | POA: Diagnosis present

## 2012-01-23 DIAGNOSIS — T465X5A Adverse effect of other antihypertensive drugs, initial encounter: Secondary | ICD-10-CM | POA: Diagnosis present

## 2012-01-23 DIAGNOSIS — F339 Major depressive disorder, recurrent, unspecified: Secondary | ICD-10-CM | POA: Diagnosis present

## 2012-01-23 DIAGNOSIS — Z90710 Acquired absence of both cervix and uterus: Secondary | ICD-10-CM

## 2012-01-23 DIAGNOSIS — Z9089 Acquired absence of other organs: Secondary | ICD-10-CM

## 2012-01-23 DIAGNOSIS — Z9119 Patient's noncompliance with other medical treatment and regimen: Secondary | ICD-10-CM

## 2012-01-23 DIAGNOSIS — K458 Other specified abdominal hernia without obstruction or gangrene: Secondary | ICD-10-CM | POA: Diagnosis present

## 2012-01-23 DIAGNOSIS — N179 Acute kidney failure, unspecified: Secondary | ICD-10-CM

## 2012-01-23 DIAGNOSIS — R0600 Dyspnea, unspecified: Secondary | ICD-10-CM

## 2012-01-23 DIAGNOSIS — Z23 Encounter for immunization: Secondary | ICD-10-CM

## 2012-01-23 DIAGNOSIS — E876 Hypokalemia: Secondary | ICD-10-CM

## 2012-01-23 DIAGNOSIS — R51 Headache: Secondary | ICD-10-CM | POA: Diagnosis present

## 2012-01-23 DIAGNOSIS — K219 Gastro-esophageal reflux disease without esophagitis: Secondary | ICD-10-CM | POA: Diagnosis present

## 2012-01-23 LAB — URINALYSIS, ROUTINE W REFLEX MICROSCOPIC
Bilirubin Urine: NEGATIVE
Glucose, UA: NEGATIVE mg/dL
Hgb urine dipstick: NEGATIVE
Ketones, ur: NEGATIVE mg/dL
Protein, ur: NEGATIVE mg/dL
Specific Gravity, Urine: 1.008 (ref 1.005–1.030)

## 2012-01-23 LAB — COMPREHENSIVE METABOLIC PANEL
Alkaline Phosphatase: 46 U/L (ref 39–117)
BUN: 18 mg/dL (ref 6–23)
GFR calc Af Amer: 56 mL/min — ABNORMAL LOW (ref >90)
GFR calc non Af Amer: 49 mL/min — ABNORMAL LOW (ref >90)
Glucose, Bld: 163 mg/dL — ABNORMAL HIGH (ref 70–99)
Potassium: 3.6 mEq/L (ref 3.5–5.1)
Total Bilirubin: 0.4 mg/dL (ref 0.3–1.2)
Total Protein: 7.4 g/dL (ref 6.0–8.3)

## 2012-01-23 LAB — TROPONIN I: Troponin I: <0.3 ng/mL (ref <0.30)

## 2012-01-23 LAB — CBC WITH DIFFERENTIAL/PLATELET
Basophils Absolute: 0 10*3/uL (ref 0.0–0.1)
Lymphocytes Relative: 14 % (ref 12–46)
Neutro Abs: 5.7 10*3/uL (ref 1.7–7.7)
Neutrophils Relative %: 73 % (ref 43–77)
Platelets: 247 10*3/uL (ref 150–400)
RDW: 13.2 % (ref 11.5–15.5)
WBC: 7.8 10*3/uL (ref 4.0–10.5)

## 2012-01-23 LAB — GLUCOSE, CAPILLARY: Glucose-Capillary: 123 mg/dL — ABNORMAL HIGH (ref 70–99)

## 2012-01-23 MED ORDER — ACETAMINOPHEN 325 MG PO TABS: 650.0000 mg | ORAL_TABLET | Freq: Four times a day (QID) | ORAL | Status: DC | PRN

## 2012-01-23 MED ORDER — CLONIDINE HCL 0.1 MG PO TABS
0.1000 mg | ORAL_TABLET | Freq: Three times a day (TID) | ORAL | Status: DC
  Administered 2012-01-23 – 2012-01-24 (×4): 0.1 mg via ORAL
  Filled 2012-01-23 (×5): qty 1

## 2012-01-23 MED ORDER — PERPHENAZINE 2 MG PO TABS
2.0000 mg | ORAL_TABLET | Freq: Every day | ORAL | Status: DC
  Administered 2012-01-23 – 2012-01-24 (×2): 2 mg via ORAL
  Filled 2012-01-23 (×3): qty 1

## 2012-01-23 MED ORDER — SENNA 8.6 MG PO TABS
1.0000 | ORAL_TABLET | Freq: Two times a day (BID) | ORAL | Status: DC
  Administered 2012-01-23 – 2012-01-25 (×4): 8.6 mg via ORAL
  Filled 2012-01-23 (×5): qty 1

## 2012-01-23 MED ORDER — VALPROATE SODIUM 500 MG/5ML IV SOLN
500.0000 mg | Freq: Once | INTRAVENOUS | Status: AC
  Administered 2012-01-23: 500 mg via INTRAVENOUS
  Filled 2012-01-23: qty 5

## 2012-01-23 MED ORDER — SODIUM CHLORIDE 0.9 % IJ SOLN: 3.0000 mL | Freq: Two times a day (BID) | INTRAMUSCULAR | Status: DC

## 2012-01-23 MED ORDER — CLONAZEPAM 0.5 MG PO TABS
1.0000 mg | ORAL_TABLET | Freq: Two times a day (BID) | ORAL | Status: DC
  Administered 2012-01-23 – 2012-01-25 (×4): 1 mg via ORAL
  Filled 2012-01-23: qty 1
  Filled 2012-01-23: qty 2
  Filled 2012-01-23: qty 1
  Filled 2012-01-23 (×2): qty 2

## 2012-01-23 MED ORDER — ONDANSETRON HCL 4 MG PO TABS: 4.0000 mg | ORAL_TABLET | Freq: Four times a day (QID) | ORAL | Status: DC | PRN

## 2012-01-23 MED ORDER — FOLIC ACID 1 MG PO TABS
1.0000 mg | ORAL_TABLET | Freq: Two times a day (BID) | ORAL | Status: DC
  Administered 2012-01-23 – 2012-01-25 (×5): 1 mg via ORAL
  Filled 2012-01-23 (×6): qty 1

## 2012-01-23 MED ORDER — OCUVITE PO TABS
1.0000 | ORAL_TABLET | Freq: Every day | ORAL | Status: DC
  Administered 2012-01-23 – 2012-01-25 (×3): 1 via ORAL
  Filled 2012-01-23 (×3): qty 1

## 2012-01-23 MED ORDER — HYDRALAZINE HCL 20 MG/ML IJ SOLN: 10.0000 mg | Freq: Once | INTRAMUSCULAR | Status: DC

## 2012-01-23 MED ORDER — SODIUM CHLORIDE 0.9 % IJ SOLN
3.0000 mL | Freq: Two times a day (BID) | INTRAMUSCULAR | Status: DC
  Administered 2012-01-23 – 2012-01-25 (×5): 3 mL via INTRAVENOUS

## 2012-01-23 MED ORDER — BISACODYL 10 MG RE SUPP: 10.0000 mg | Freq: Every day | RECTAL | Status: DC | PRN

## 2012-01-23 MED ORDER — HYDRALAZINE HCL 50 MG PO TABS
50.0000 mg | ORAL_TABLET | Freq: Two times a day (BID) | ORAL | Status: DC
  Administered 2012-01-23 – 2012-01-24 (×3): 50 mg via ORAL
  Filled 2012-01-23 (×4): qty 1

## 2012-01-23 MED ORDER — LABETALOL HCL 5 MG/ML IV SOLN: 20.0000 mg | Freq: Once | INTRAVENOUS | Status: DC

## 2012-01-23 MED ORDER — SODIUM CHLORIDE 0.9 % IJ SOLN: 3.0000 mL | INTRAMUSCULAR | Status: DC | PRN

## 2012-01-23 MED ORDER — ACETAMINOPHEN 650 MG RE SUPP: 650.0000 mg | Freq: Four times a day (QID) | RECTAL | Status: DC | PRN

## 2012-01-23 MED ORDER — IOHEXOL 300 MG/ML  SOLN
20.0000 mL | INTRAMUSCULAR | Status: AC
  Administered 2012-01-23: 20 mL via ORAL

## 2012-01-23 MED ORDER — PERPHENAZINE-AMITRIPTYLINE 2-25 MG PO TABS: 1.0000 | ORAL_TABLET | Freq: Every day | ORAL | Status: DC

## 2012-01-23 MED ORDER — SODIUM CHLORIDE 0.9 % IV SOLN: 250.0000 mL | INTRAVENOUS | Status: DC | PRN

## 2012-01-23 MED ORDER — HYDRALAZINE HCL 20 MG/ML IJ SOLN
10.0000 mg | Freq: Once | INTRAMUSCULAR | Status: AC
  Administered 2012-01-23: 10 mg via INTRAVENOUS
  Filled 2012-01-23: qty 1

## 2012-01-23 MED ORDER — ATORVASTATIN CALCIUM 10 MG PO TABS
10.0000 mg | ORAL_TABLET | Freq: Every day | ORAL | Status: DC
  Administered 2012-01-23 – 2012-01-24 (×2): 10 mg via ORAL
  Filled 2012-01-23 (×3): qty 1

## 2012-01-23 MED ORDER — IOHEXOL 300 MG/ML  SOLN
100.0000 mL | Freq: Once | INTRAMUSCULAR | Status: AC | PRN
  Administered 2012-01-23: 100 mL via INTRAVENOUS

## 2012-01-23 MED ORDER — ONDANSETRON HCL 4 MG/2ML IJ SOLN: 4.0000 mg | Freq: Three times a day (TID) | INTRAMUSCULAR | Status: DC | PRN

## 2012-01-23 MED ORDER — ZOLPIDEM TARTRATE 5 MG PO TABS: 5.0000 mg | ORAL_TABLET | Freq: Every evening | ORAL | Status: DC | PRN

## 2012-01-23 MED ORDER — ALUM & MAG HYDROXIDE-SIMETH 200-200-20 MG/5ML PO SUSP
30.0000 mL | Freq: Four times a day (QID) | ORAL | Status: DC | PRN
  Administered 2012-01-23 – 2012-01-25 (×2): 30 mL via ORAL
  Filled 2012-01-23 (×2): qty 30

## 2012-01-23 MED ORDER — LISINOPRIL 20 MG PO TABS
20.0000 mg | ORAL_TABLET | Freq: Every day | ORAL | Status: DC
  Administered 2012-01-24 – 2012-01-25 (×2): 20 mg via ORAL
  Filled 2012-01-23 (×2): qty 1

## 2012-01-23 MED ORDER — FLEET ENEMA 7-19 GM/118ML RE ENEM
1.0000 | ENEMA | Freq: Once | RECTAL | Status: AC | PRN
  Filled 2012-01-23: qty 1

## 2012-01-23 MED ORDER — INSULIN ASPART 100 UNIT/ML ~~LOC~~ SOLN
0.0000 [IU] | Freq: Three times a day (TID) | SUBCUTANEOUS | Status: DC
  Administered 2012-01-23: 3 [IU] via SUBCUTANEOUS
  Administered 2012-01-24 – 2012-01-25 (×2): 1 [IU] via SUBCUTANEOUS

## 2012-01-23 MED ORDER — AMITRIPTYLINE HCL 25 MG PO TABS
25.0000 mg | ORAL_TABLET | Freq: Every day | ORAL | Status: DC
  Administered 2012-01-23 – 2012-01-24 (×2): 25 mg via ORAL
  Filled 2012-01-23 (×3): qty 1

## 2012-01-23 MED ORDER — ONDANSETRON HCL 4 MG/2ML IJ SOLN: 4.0000 mg | Freq: Four times a day (QID) | INTRAMUSCULAR | Status: DC | PRN

## 2012-01-23 MED ORDER — METOCLOPRAMIDE HCL 5 MG/ML IJ SOLN
5.0000 mg | Freq: Three times a day (TID) | INTRAMUSCULAR | Status: DC
  Administered 2012-01-23 – 2012-01-25 (×6): 5 mg via INTRAVENOUS
  Filled 2012-01-23 (×10): qty 1

## 2012-01-23 MED ORDER — ENOXAPARIN SODIUM 40 MG/0.4ML ~~LOC~~ SOLN
40.0000 mg | SUBCUTANEOUS | Status: DC
  Administered 2012-01-23 – 2012-01-24 (×2): 40 mg via SUBCUTANEOUS
  Filled 2012-01-23 (×3): qty 0.4

## 2012-01-23 MED ORDER — PANTOPRAZOLE SODIUM 40 MG IV SOLR
40.0000 mg | INTRAVENOUS | Status: DC
  Administered 2012-01-24 – 2012-01-25 (×2): 40 mg via INTRAVENOUS
  Filled 2012-01-23 (×2): qty 40

## 2012-01-23 MED ORDER — DOCUSATE SODIUM 100 MG PO CAPS
100.0000 mg | ORAL_CAPSULE | Freq: Two times a day (BID) | ORAL | Status: DC
  Administered 2012-01-23 – 2012-01-25 (×4): 100 mg via ORAL
  Filled 2012-01-23 (×5): qty 1

## 2012-01-23 MED ORDER — NIFEDIPINE ER 30 MG PO TB24
30.0000 mg | ORAL_TABLET | Freq: Every day | ORAL | Status: DC
  Administered 2012-01-23 – 2012-01-25 (×3): 30 mg via ORAL
  Filled 2012-01-23 (×3): qty 1

## 2012-01-23 MED ORDER — PROPRANOLOL HCL 60 MG PO TABS
60.0000 mg | ORAL_TABLET | Freq: Two times a day (BID) | ORAL | Status: DC
  Administered 2012-01-23 – 2012-01-25 (×5): 60 mg via ORAL
  Filled 2012-01-23 (×6): qty 1

## 2012-01-23 NOTE — Progress Notes (Signed)
Disposition Note  Tamara Arnold, is a 78 y.o. female,   MRN: [id number]  -  DOB - [birth year]  Outpatient Primary MD for the patient is BURNETT,BRENT A, MD   Blood pressure 203/70, pulse 103, temperature 98.2 F (36.8 C), temperature source Oral, resp. rate 14, SpO2 96.00%.  Active Problems:  Accelerated hypertension  Headache  Constipation  Diabetes mellitus  H/O major depression  Hyperlipidemia  Hypertension  Reflux   78 yo female with previous admissions for accelerated hypertension, presents this morning with high blood pressure despite taking her home medications, headache, abdominal pain and distension, shortness of breath, and palpations.   Lab results show an elevated BNP (2925) which is not significantly changed from her BNP of 8/1 (2733).  She does not have an Echo in [place].  Her blood pressure in the ED has been controlled with PRN doses of hydralazine and Labetalol.  EDP does not think a Drip is necessary at this time.  Per EDP her HA is chronic and there is no chest pain or double vision.    EKG shows minimal ST depression in V5, V6, but this appears unchanged from July 2013 EKG.  CXR shows cardiomegaly with small bilateral pleural effusions, and CT Abd/Pelvis shows prominent constipation.  I believe constipation is contributing to her abdominal pain and distension.    As her BP appears to be able to be controlled with PRN dosing of BP meds, I will request a telemetry bed for accelerated hypertension and constipation.  She may benefit from an echocardiogram.  Imogene Burn, PA-C [facility] Pager: [phone]

## 2012-01-23 NOTE — ED Notes (Signed)
Husband, pharmacy tech and lab at [facility].

## 2012-01-23 NOTE — ED Notes (Signed)
Pt is in CT

## 2012-01-23 NOTE — H&P (Signed)
[facility] History and Physical  Tamara Arnold [id number] DOB: 1925/05/30 DOA: 01/23/2012    PCP: Stephens Shire, MD   Chief Complaint:  Chief Complaint  Patient presents with  . Abdominal Pain  . Shortness of Breath  . Hypertension     HPI: Tamara Arnold is a 78 y.o. female with a very long history of severe HTN and headaches, multiple admissions for same complains throughout the years.    Review of Systems: The patient denies anorexia, fever, weight loss, vision loss, decreased hearing, hoarseness, chest pain, syncope, dyspnea on exertion, peripheral edema, balance deficits, hemoptysis, melena, hematochezia, severe indigestion/heartburn, hematuria, incontinence, genital sores, muscle weakness, suspicious skin lesions, transient blindness, difficulty walking,  unusual weight change, abnormal bleeding, enlarged lymph nodes, angioedema,    Past Medical History  Diagnosis Date  . Hypertension   . Diabetes mellitus   . Migraine   . Reflux   . Hyperlipidemia    Past Surgical History  Procedure Date  . Cholecystectomy   . Abdominal hysterectomy   . Appendectomy   . Hernia repair   . Cystectomy   . Cataract extraction    Social History:  reports that she has never smoked. She does not have any smokeless tobacco history on file. She reports that she does not drink alcohol or use illicit drugs. Patient lives at home with husband and informs me she is happily married   Allergies  Allergen Reactions  . Prednisone Other (See Comments)    Headaches-all steroids  . Topiramate Other (See Comments)    Makes headaches worse  . Wellbutrin (Bupropion Hcl) Other (See Comments)    Caused headaches    Family History  Problem Relation Age of Onset  . Diabetes type II Father      Prior to Admission medications   Medication Sig Start Date End Date Taking? Authorizing Provider  amitriptyline (ELAVIL) 25 MG tablet Take 50 mg by mouth at bedtime.   Yes Historical  Provider, MD  ascorbic acid (VITAMIN C) 500 MG tablet Take 500 mg by mouth daily.     Yes Historical Provider, MD  aspirin 81 MG chewable tablet Chew 81 mg by mouth daily.     Yes Historical Provider, MD  beta carotene w/minerals (OCUVITE) tablet Take 1 tablet by mouth 2 (two) times daily.    Yes Historical Provider, MD  beta carotene w/minerals (OCUVITE) tablet Take 1 tablet by mouth daily.   Yes Historical Provider, MD  clonazePAM (KLONOPIN) 1 MG tablet Take 1 mg by mouth 2 (two) times daily. For anxiety   Yes Historical Provider, MD  cloNIDine (CATAPRES) 0.1 MG tablet Take 0.1 mg by mouth 3 (three) times daily.   Yes Historical Provider, MD  folic acid (FOLVITE) 1 MG tablet Take 1 mg by mouth 2 (two) times daily.     Yes Historical Provider, MD  glimepiride (AMARYL) 2 MG tablet Take 2 mg by mouth daily before breakfast.     Yes Historical Provider, MD  hydrALAZINE (APRESOLINE) 25 MG tablet Take 2 tablets (50 mg total) by mouth 2 times daily at 12 noon and 4 pm. 12/22/11  Yes Reyne Dumas, MD  lisinopril (PRINIVIL,ZESTRIL) 20 MG tablet Take 20 mg by mouth daily.   Yes Historical Provider, MD  meloxicam (MOBIC) 15 MG tablet Take 15 mg by mouth daily.   Yes Historical Provider, MD  Multiple Vitamins-Minerals (MULTIVITAMINS THER. W/MINERALS) TABS Take 1 tablet by mouth daily.     Yes Historical Provider, MD  NIFEdipine (PROCARDIA-XL/ADALAT CC) 30 MG 24 hr tablet Take 1 tablet (30 mg total) by mouth daily. 12/03/11 12/02/12 Yes Leana Gamer, MD  Omega-3 Fatty Acids (FISH OIL) 1200 MG CAPS Take 2 capsules by mouth daily.   Yes Historical Provider, MD  omeprazole (PRILOSEC) 20 MG capsule Take 20 mg by mouth daily.     Yes Historical Provider, MD  perphenazine-amitriptyline (ETRAFON/TRIAVIL) 2-25 MG TABS Take 1 tablet by mouth daily.     Yes Historical Provider, MD  propranolol (INDERAL) 60 MG tablet Take 60 mg by mouth 2 (two) times daily.   Yes Historical Provider, MD  rosuvastatin (CRESTOR) 20 MG  tablet Take 20 mg by mouth daily.   Yes Historical Provider, MD  vitamin E (VITAMIN E) 400 UNIT capsule Take 400 Units by mouth daily.     Yes Historical Provider, MD   Physical Exam: Filed Vitals:   01/23/12 1145 01/23/12 1230 01/23/12 1328 01/23/12 1623  BP:  185/62 208/72 182/74  Pulse: 91 90 96   Temp:   98.2 F (36.8 C)   TempSrc:   Oral   Resp:   16   Height:   5\' 2"  (1.575 m)   Weight:   65.772 kg (145 lb)   SpO2: 98% 97% 97%      General:  Alert and oriented x3  Eyes: PERRLA, EOMI   ENT: clear pharynx, normal external ears.   Neck: no JVD, no thyromegaly  Cardiovascular: regular rate and rhythm without murmur subscales  Respiratory: clear to auscultation bilaterally  Abdomen: distended, tympanitic to percussion, minimal tenderness but without rebound or guarding  Skin: warm dry without rashes  Musculoskeletal: intact  Psychiatric: depressed labile mood  Neurologic: cn 2-12 intact, strength 5/5 all 4   Labs on Admission:  Basic Metabolic Panel:  Lab 123XX123 0643  NA 138  K 3.6  CL 99  CO2 27  GLUCOSE 163*  BUN 18  CREATININE 1.03  CALCIUM 10.7*  MG --  PHOS --   Liver Function Tests:  Lab 01/23/12 0643  AST 18  ALT 11  ALKPHOS 46  BILITOT 0.4  PROT 7.4  ALBUMIN 3.4*   No results found for this basename: LIPASE:5,AMYLASE:5 in the last 168 hours No results found for this basename: AMMONIA:5 in the last 168 hours CBC:  Lab 01/23/12 0643  WBC 7.8  NEUTROABS 5.7  HGB 9.9*  HCT 29.2*  MCV 86.4  PLT 247   Cardiac Enzymes:  Lab 01/23/12 0643  CKTOTAL --  CKMB --  CKMBINDEX --  TROPONINI <0.30    BNP (last 3 results)  Basename 01/23/12 0643 12/21/11 0204  PROBNP 2925.0* 2733.0*   CBG: No results found for this basename: GLUCAP:5 in the last 168 hours  Radiological Exams on Admission: Ct Head Wo Contrast  01/23/2012  *RADIOLOGY REPORT*  Clinical Data: Abdominal pain, shortness of breath and hypertension.  Headache.  CT HEAD  WITHOUT CONTRAST  Technique:  Contiguous axial images were obtained from the base of the skull through the vertex without contrast.  Comparison: Brain MRI MRA 12/21/2011.  Head CT 12/20/2011.  Findings: Small 1 cm extra-axial low attenuation area (parallels that of CSF) in the high right parietal vertex is unchanged.  No acute intracranial abnormalities.  Specifically, no definite evidence to suggest acute/subacute cerebral ischemia, no evidence of acute intracranial hemorrhage, no other focal mass, mass effect, hydrocephalus or abnormal intra or extra-axial fluid collections. No acute displaced skull fractures are identified.  Visualized paranasal sinuses and  mastoids are well pneumatized.  IMPRESSION: 1.  No acute intracranial abnormalities.  The appearance of the brain is unchanged, as above.   Original Report Authenticated By: Etheleen Mayhew, M.D.    Ct Abdomen Pelvis W Contrast  01/23/2012  *RADIOLOGY REPORT*  Clinical Data: Abdominal pain.  Short of breath.  CT ABDOMEN AND PELVIS WITH CONTRAST  Technique:  Multidetector CT imaging of the abdomen and pelvis was performed following the standard protocol during bolus administration of intravenous contrast.  Contrast: 153mL OMNIPAQUE IOHEXOL 300 MG/ML  SOLN  Comparison: None.  Findings: Small bilateral pleural effusions right greater than left with minimal basilar atelectasis.  Post cholecystectomy.  Tiny hypodensities in the left lobe of the liver have a benign appearance.  Spleen, pancreas, adrenal glands are within normal limits.  Mild chronic changes of the kidneys.  Tiny curvilinear calcification in the hilum of the spleen on image 90 may represent a small 8 mm aneurysm.  Tiny hypodensities in the kidneys, in the right kidney on image 14 series 5, and in the left kidney on image 8 of series 5 are nonspecific.  Atherosclerotic changes of the aorta and SMA are present.  No obvious occlusion of the SMA.  Right lower quadrant ventral hernia repair with mesh  has been performed.  There is a recurrent hernia at the inferior aspect of the mass containing small bowel loops.  Negative free fluid in the abdomen.  No evidence of abnormal adenopathy.  Bladder is mildly distended.  Non-visualized appendix.  There is prominent lumbar degenerative disc disease at multiple levels.  No compression fracture.  IMPRESSION: Recurrent right lower quadrant hernia status post hernia repair. No evidence of bowel obstruction.  Small bilateral pleural effusions.  Small hypodensities in the liver and kidneys as described.  Bladder distention.  Degenerative changes in the lumbar spine.   Original Report Authenticated By: Jamas Lav, M.D.    Dg Abd Acute W/chest  01/23/2012  *RADIOLOGY REPORT*  Clinical Data: Short of breath.  Abdominal pain.  Hypertension.  ACUTE ABDOMEN SERIES (ABDOMEN 2 VIEW & CHEST 1 VIEW)  Comparison: 09/14/2010.  Findings: Stable enlargement of the cardiopericardial silhouette. Small bilateral pleural effusions are present. Monitoring leads are projected over the chest.  No free air underneath the hemidiaphragms. Cholecystectomy clips are present in the right upper quadrant.  Fluid level is present in the stomach.  The bowel gas pattern is within normal limits. Prominent stool is present in the right colon.  Stool and bowel gas extends to the descending colon.  Inferior pelvis not visualized on this radiograph. Right lower quadrant hernia repair tacks are present.  IMPRESSION:  1.  Stable cardiomegaly and small bilateral pleural effusions. 2. No acute abdominal findings.   Original Report Authenticated By: Dereck Ligas, M.D.       Assessment/Plan Active Problems:  Headache  Accelerated hypertension  Diabetes mellitus  H/O major depression  Hyperlipidemia  Hypertension  Reflux  Constipation  Anemia   1. Hypertensive urgency-recurrent problem for this patient. I have reviewed at least 6 prior admissions and all are for the same reason. Unclear to me  what the trigger is but obviously noncompliance will come to my mind. Resume all home medications and monitor closely in house. 2. Abdominal pain-suspect due to constipation, mild gastric distention is observed on the CT scan, symptoms  probably exacerbated by clonidine use. Start Reglan around-the-clock and protonix iv. Patient has not vomited and she has no melena. She does have chronic anemia which dates  as far back as 2012 3. Headaches-again without clear etiology - I will give the patient a trial of IV valproic acid 4. Major depression - she has been on antipsychotic and tricyclic antidepressant for a long time. I think this medication is actually contributing through their side effects to constipation abdominal pain. I will probably resume the antipsychotic since she has been on it for a very long time. 5. Constipation- multifactorial - start Colace Senokot  Code Status: full code Family Communication: husband Disposition Plan: home  Time spent: 3 minutes  Jeffifer Rabold [facility] Pager [phone]  If 7PM-7AM, please contact night-coverage [email or web address] Password [facility] 01/23/2012, 4:46 PM

## 2012-01-23 NOTE — ED Notes (Signed)
EDP at [facility] 

## 2012-01-23 NOTE — ED Notes (Addendum)
Here by EMS, here from home, husband to follow, here for abd pain and distention, "swollen abd making it hard to breathe" (sob), swelling started 2d ago. BP noted to be high, also "feel fluttering/palpitations". Also frequent urination.  "Was to see PCP tomorrow but could not wait". No IV upon arrival. Pt alert, NAD, calm, interactive, skin W&D, resps e/u, speaking in clear complete sentences. Pt assisted with transfer from stretcher to stretcher. Pt ambulatory to b/r upon arrival to ED. Denies: nvd, fever, bleeding. Last BM today (normal).

## 2012-01-23 NOTE — ED Notes (Signed)
Report given to Tiffany, RN pt to go to [phone]

## 2012-01-23 NOTE — Progress Notes (Signed)
Pt admitted to room 4739.  Pt alert and oriented, pt oriented to room, and placed on tele.  Will carry out MD orders.

## 2012-01-23 NOTE — ED Notes (Signed)
Patient transported to X-ray 

## 2012-01-23 NOTE — ED Notes (Signed)
Patient transported to CT 

## 2012-01-23 NOTE — ED Provider Notes (Signed)
History     CSN: [id number]  Arrival date & time 01/23/12  [id number]   First MD Initiated Contact with Patient 01/23/12 [phone]      Chief Complaint  Patient presents with  . Abdominal Pain  . Shortness of Breath  . Hypertension    (Consider location/radiation/quality/duration/timing/severity/associated sxs/prior treatment) HPI Comments: Patient presents with elevated blood pressure for the past 3 days. She states she's been taking her medications as prescribed including nifedipine, hydralazine, clonidine. She endorses a gradual onset headache. She also complains of abdominal distention and diffuse abdominal pain and difficulty breathing because of abdominal pain. She denies any chest pain or fever. No cough or urinary symptoms. She denies any visual change. She states compliance with her blood pressure medications. Her blood pressure this morning was elevated over 210. He had an admission in July for celery hypertension and negative MR I and CT scans.  The history is provided by the patient and the spouse.    Past Medical History  Diagnosis Date  . Hypertension   . Diabetes mellitus   . Migraine   . Reflux   . Hyperlipidemia     Past Surgical History  Procedure Date  . Cholecystectomy   . Abdominal hysterectomy   . Appendectomy   . Hernia repair   . Cystectomy   . Cataract extraction     Family History  Problem Relation Age of Onset  . Diabetes type II Father     History  Substance Use Topics  . Smoking status: Never Smoker   . Smokeless tobacco: Not on file  . Alcohol Use: No    OB History    Grav Para Term Preterm Abortions TAB SAB Ect Mult Living                  Review of Systems  Constitutional: Positive for activity change, appetite change and fatigue. Negative for fever.  HENT: Negative for congestion and rhinorrhea.   Respiratory: Positive for shortness of breath. Negative for chest tightness.   Cardiovascular: Negative for chest pain.    Gastrointestinal: Positive for nausea and abdominal pain. Negative for vomiting and diarrhea.  Genitourinary: Negative for dysuria, vaginal bleeding and vaginal discharge.  Musculoskeletal: Negative for back pain.  Skin: Negative for rash.  Neurological: Positive for weakness and light-headedness. Negative for dizziness and headaches.    Allergies  Prednisone; Topiramate; and Wellbutrin  Home Medications   Current Outpatient Rx  Name Route Sig Dispense Refill  . AMITRIPTYLINE HCL 25 MG PO TABS Oral Take 50 mg by mouth at bedtime.    . ASCORBIC ACID 500 MG PO TABS Oral Take 500 mg by mouth daily.      . ASPIRIN 81 MG PO CHEW Oral Chew 81 mg by mouth daily.      . OCUVITE PO TABS Oral Take 1 tablet by mouth 2 (two) times daily.     . OCUVITE PO TABS Oral Take 1 tablet by mouth daily.    Marland Kitchen CLONAZEPAM 1 MG PO TABS Oral Take 1 mg by mouth 2 (two) times daily. For anxiety    . CLONIDINE HCL 0.1 MG PO TABS Oral Take 0.1 mg by mouth 3 (three) times daily.    Marland Kitchen FOLIC ACID 1 MG PO TABS Oral Take 1 mg by mouth 2 (two) times daily.      Marland Kitchen GLIMEPIRIDE 2 MG PO TABS Oral Take 2 mg by mouth daily before breakfast.      . HYDRALAZINE HCL 25 MG  PO TABS Oral Take 2 tablets (50 mg total) by mouth 2 times daily at 12 noon and 4 pm. 60 tablet 0  . LISINOPRIL 20 MG PO TABS Oral Take 20 mg by mouth daily.    . MELOXICAM 15 MG PO TABS Oral Take 15 mg by mouth daily.    Creed Copper M PLUS PO TABS Oral Take 1 tablet by mouth daily.      Marland Kitchen NIFEDIPINE ER 30 MG PO TB24 Oral Take 1 tablet (30 mg total) by mouth daily. 30 tablet 0  . FISH OIL 1200 MG PO CAPS Oral Take 2 capsules by mouth daily.    Marland Kitchen OMEPRAZOLE 20 MG PO CPDR Oral Take 20 mg by mouth daily.      Marland Kitchen PERPHENAZINE-AMITRIPTYLINE 2-25 MG PO TABS Oral Take 1 tablet by mouth daily.      Marland Kitchen PROPRANOLOL HCL 60 MG PO TABS Oral Take 60 mg by mouth 2 (two) times daily.    Marland Kitchen ROSUVASTATIN CALCIUM 20 MG PO TABS Oral Take 20 mg by mouth daily.    Marland Kitchen VITAMIN E 400 UNITS  PO CAPS Oral Take 400 Units by mouth daily.        BP 203/70  Pulse 103  Temp 98.2 F (36.8 C) (Oral)  Resp 14  SpO2 96%  Physical Exam  Constitutional: She is oriented to person, place, and time. She appears well-developed and well-nourished. No distress.  HENT:  Head: Normocephalic and atraumatic.  Mouth/Throat: No oropharyngeal exudate.  Eyes: Conjunctivae and EOM are normal. Pupils are equal, round, and reactive to light.  Neck: Normal range of motion. Neck supple.       No meningismus  Cardiovascular: Normal rate, regular rhythm and normal heart sounds.   No murmur heard. Pulmonary/Chest: Effort normal and breath sounds normal. No respiratory distress.  Abdominal: Soft. She exhibits distension. There is no tenderness. There is no rebound and no guarding.  Musculoskeletal: Normal range of motion. She exhibits no edema and no tenderness.  Neurological: She is alert and oriented to person, place, and time. No cranial nerve deficit.       5 out of 5 strength throughout, cranial nerves to 12 intact, no ataxia finger to nose, no nystagmus  Skin: Skin is warm.    ED Course  Procedures (including critical care time)  Labs Reviewed  COMPREHENSIVE METABOLIC PANEL - Abnormal; Notable for the following:    Glucose, Bld 163 (*)     Calcium 10.7 (*)     Albumin 3.4 (*)     GFR calc non Af Amer 49 (*)     GFR calc Af Amer 56 (*)     All other components within normal limits  CBC WITH DIFFERENTIAL - Abnormal; Notable for the following:    RBC 3.38 (*)     Hemoglobin 9.9 (*)     HCT 29.2 (*)     All other components within normal limits  PRO B NATRIURETIC PEPTIDE - Abnormal; Notable for the following:    Pro B Natriuretic peptide (BNP) 2925.0 (*)     All other components within normal limits  URINALYSIS, ROUTINE W REFLEX MICROSCOPIC  TROPONIN I   Ct Head Wo Contrast  01/23/2012  *RADIOLOGY REPORT*  Clinical Data: Abdominal pain, shortness of breath and hypertension.  Headache.   CT HEAD WITHOUT CONTRAST  Technique:  Contiguous axial images were obtained from the base of the skull through the vertex without contrast.  Comparison: Brain MRI MRA 12/21/2011.  Head  CT 12/20/2011.  Findings: Small 1 cm extra-axial low attenuation area (parallels that of CSF) in the high right parietal vertex is unchanged.  No acute intracranial abnormalities.  Specifically, no definite evidence to suggest acute/subacute cerebral ischemia, no evidence of acute intracranial hemorrhage, no other focal mass, mass effect, hydrocephalus or abnormal intra or extra-axial fluid collections. No acute displaced skull fractures are identified.  Visualized paranasal sinuses and mastoids are well pneumatized.  IMPRESSION: 1.  No acute intracranial abnormalities.  The appearance of the brain is unchanged, as above.   Original Report Authenticated By: Etheleen Mayhew, M.D.    Ct Abdomen Pelvis W Contrast  01/23/2012  *RADIOLOGY REPORT*  Clinical Data: Abdominal pain.  Short of breath.  CT ABDOMEN AND PELVIS WITH CONTRAST  Technique:  Multidetector CT imaging of the abdomen and pelvis was performed following the standard protocol during bolus administration of intravenous contrast.  Contrast: 154mL OMNIPAQUE IOHEXOL 300 MG/ML  SOLN  Comparison: None.  Findings: Small bilateral pleural effusions right greater than left with minimal basilar atelectasis.  Post cholecystectomy.  Tiny hypodensities in the left lobe of the liver have a benign appearance.  Spleen, pancreas, adrenal glands are within normal limits.  Mild chronic changes of the kidneys.  Tiny curvilinear calcification in the hilum of the spleen on image 90 may represent a small 8 mm aneurysm.  Tiny hypodensities in the kidneys, in the right kidney on image 14 series 5, and in the left kidney on image 8 of series 5 are nonspecific.  Atherosclerotic changes of the aorta and SMA are present.  No obvious occlusion of the SMA.  Right lower quadrant ventral hernia repair  with mesh has been performed.  There is a recurrent hernia at the inferior aspect of the mass containing small bowel loops.  Negative free fluid in the abdomen.  No evidence of abnormal adenopathy.  Bladder is mildly distended.  Non-visualized appendix.  There is prominent lumbar degenerative disc disease at multiple levels.  No compression fracture.  IMPRESSION: Recurrent right lower quadrant hernia status post hernia repair. No evidence of bowel obstruction.  Small bilateral pleural effusions.  Small hypodensities in the liver and kidneys as described.  Bladder distention.  Degenerative changes in the lumbar spine.   Original Report Authenticated By: Jamas Lav, M.D.    Dg Abd Acute W/chest  01/23/2012  *RADIOLOGY REPORT*  Clinical Data: Short of breath.  Abdominal pain.  Hypertension.  ACUTE ABDOMEN SERIES (ABDOMEN 2 VIEW & CHEST 1 VIEW)  Comparison: 09/14/2010.  Findings: Stable enlargement of the cardiopericardial silhouette. Small bilateral pleural effusions are present. Monitoring leads are projected over the chest.  No free air underneath the hemidiaphragms. Cholecystectomy clips are present in the right upper quadrant.  Fluid level is present in the stomach.  The bowel gas pattern is within normal limits. Prominent stool is present in the right colon.  Stool and bowel gas extends to the descending colon.  Inferior pelvis not visualized on this radiograph. Right lower quadrant hernia repair tacks are present.  IMPRESSION:  1.  Stable cardiomegaly and small bilateral pleural effusions. 2. No acute abdominal findings.   Original Report Authenticated By: Dereck Ligas, M.D.      1. Accelerated hypertension   2. Dyspnea       MDM  Elevated blood pressure with abdominal pain and shortness of breath.  Similar admissions in past.  Accelerated hypertension without evidence of endorgan damage. Abdominal CT negative for acute pathology. Abdomen soft and nontender.  BP remains uncontrolled despite  PRN labetalol and hydralazine. Will need admission for BP control.   Date: 01/23/2012  Rate: 93  Rhythm: normal sinus rhythm and premature ventricular contractions (PVC)  QRS Axis: normal  Intervals: normal  ST/T Wave abnormalities: normal  Conduction Disutrbances:none  Narrative Interpretation:   Old EKG Reviewed: unchanged    Ezequiel Essex, MD 01/23/12 [phone]

## 2012-01-24 DIAGNOSIS — R51 Headache: Secondary | ICD-10-CM

## 2012-01-24 LAB — BASIC METABOLIC PANEL
BUN: 12 mg/dL (ref 6–23)
CO2: 28 mEq/L (ref 19–32)
Chloride: 101 mEq/L (ref 96–112)
Creatinine, Ser: 1.21 mg/dL — ABNORMAL HIGH (ref 0.50–1.10)
GFR calc Af Amer: 46 mL/min — ABNORMAL LOW (ref >90)
Glucose, Bld: 105 mg/dL — ABNORMAL HIGH (ref 70–99)
Potassium: 3.2 mEq/L — ABNORMAL LOW (ref 3.5–5.1)

## 2012-01-24 LAB — CBC
HCT: 25.5 % — ABNORMAL LOW (ref 36.0–46.0)
Hemoglobin: 8.8 g/dL — ABNORMAL LOW (ref 12.0–15.0)
MCH: 30 pg (ref 26.0–34.0)
MCHC: 34.5 g/dL (ref 30.0–36.0)
MCV: 87 fL (ref 78.0–100.0)

## 2012-01-24 LAB — GLUCOSE, CAPILLARY
Glucose-Capillary: 149 mg/dL — ABNORMAL HIGH (ref 70–99)
Glucose-Capillary: 77 mg/dL (ref 70–99)

## 2012-01-24 MED ORDER — CLONIDINE HCL 0.1 MG PO TABS
0.1000 mg | ORAL_TABLET | Freq: Two times a day (BID) | ORAL | Status: DC
  Administered 2012-01-25: 0.1 mg via ORAL
  Filled 2012-01-24 (×2): qty 1

## 2012-01-24 MED ORDER — PNEUMOCOCCAL VAC POLYVALENT 25 MCG/0.5ML IJ INJ
0.5000 mL | INJECTION | INTRAMUSCULAR | Status: AC
  Administered 2012-01-25: 0.5 mL via INTRAMUSCULAR
  Filled 2012-01-24: qty 0.5

## 2012-01-24 MED ORDER — HYDRALAZINE HCL 50 MG PO TABS
50.0000 mg | ORAL_TABLET | Freq: Three times a day (TID) | ORAL | Status: DC
  Administered 2012-01-24 – 2012-01-25 (×3): 50 mg via ORAL
  Filled 2012-01-24 (×6): qty 1

## 2012-01-24 NOTE — Progress Notes (Signed)
[facility] PROGRESS NOTE  Tamara Arnold [id number] DOB: 11/13/1925 DOA: 01/23/2012 PCP: Stephens Shire, MD  Assessment/Plan: Hypertensive urgency -Likely a component of noncompliance -Also a component of rebound hypertension from her clonidine -Plan to wean clonidine off -Increase hydralazine to 50 mg every 8 hours Abdominal pain -Due to constipation, improved after bowel movement Chronic headache -Likely due to uncontrolled hypertension -CT brain unremarkable Major Depression -Clinically stable on home medications Constipation -Continue cathartics   Procedures/Studies: Ct Head Wo Contrast  01/23/2012  *RADIOLOGY REPORT*  Clinical Data: Abdominal pain, shortness of breath and hypertension.  Headache.  CT HEAD WITHOUT CONTRAST  Technique:  Contiguous axial images were obtained from the base of the skull through the vertex without contrast.  Comparison: Brain MRI MRA 12/21/2011.  Head CT 12/20/2011.  Findings: Small 1 cm extra-axial low attenuation area (parallels that of CSF) in the high right parietal vertex is unchanged.  No acute intracranial abnormalities.  Specifically, no definite evidence to suggest acute/subacute cerebral ischemia, no evidence of acute intracranial hemorrhage, no other focal mass, mass effect, hydrocephalus or abnormal intra or extra-axial fluid collections. No acute displaced skull fractures are identified.  Visualized paranasal sinuses and mastoids are well pneumatized.  IMPRESSION: 1.  No acute intracranial abnormalities.  The appearance of the brain is unchanged, as above.   Original Report Authenticated By: Etheleen Mayhew, M.D.    Ct Abdomen Pelvis W Contrast  01/23/2012  *RADIOLOGY REPORT*  Clinical Data: Abdominal pain.  Short of breath.  CT ABDOMEN AND PELVIS WITH CONTRAST  Technique:  Multidetector CT imaging of the abdomen and pelvis was performed following the standard protocol during bolus administration of intravenous contrast.  Contrast:  175mL OMNIPAQUE IOHEXOL 300 MG/ML  SOLN  Comparison: None.  Findings: Small bilateral pleural effusions right greater than left with minimal basilar atelectasis.  Post cholecystectomy.  Tiny hypodensities in the left lobe of the liver have a benign appearance.  Spleen, pancreas, adrenal glands are within normal limits.  Mild chronic changes of the kidneys.  Tiny curvilinear calcification in the hilum of the spleen on image 90 may represent a small 8 mm aneurysm.  Tiny hypodensities in the kidneys, in the right kidney on image 14 series 5, and in the left kidney on image 8 of series 5 are nonspecific.  Atherosclerotic changes of the aorta and SMA are present.  No obvious occlusion of the SMA.  Right lower quadrant ventral hernia repair with mesh has been performed.  There is a recurrent hernia at the inferior aspect of the mass containing small bowel loops.  Negative free fluid in the abdomen.  No evidence of abnormal adenopathy.  Bladder is mildly distended.  Non-visualized appendix.  There is prominent lumbar degenerative disc disease at multiple levels.  No compression fracture.  IMPRESSION: Recurrent right lower quadrant hernia status post hernia repair. No evidence of bowel obstruction.  Small bilateral pleural effusions.  Small hypodensities in the liver and kidneys as described.  Bladder distention.  Degenerative changes in the lumbar spine.   Original Report Authenticated By: Jamas Lav, M.D.    Dg Abd Acute W/chest  01/23/2012  *RADIOLOGY REPORT*  Clinical Data: Short of breath.  Abdominal pain.  Hypertension.  ACUTE ABDOMEN SERIES (ABDOMEN 2 VIEW & CHEST 1 VIEW)  Comparison: 09/14/2010.  Findings: Stable enlargement of the cardiopericardial silhouette. Small bilateral pleural effusions are present. Monitoring leads are projected over the chest.  No free air underneath the hemidiaphragms. Cholecystectomy clips are present in the right  upper quadrant.  Fluid level is present in the stomach.  The bowel  gas pattern is within normal limits. Prominent stool is present in the right colon.  Stool and bowel gas extends to the descending colon.  Inferior pelvis not visualized on this radiograph. Right lower quadrant hernia repair tacks are present.  IMPRESSION:  1.  Stable cardiomegaly and small bilateral pleural effusions. 2. No acute abdominal findings.   Original Report Authenticated By: Dereck Ligas, M.D.       Code Status: Full  Disposition Plan: Home when medically stable  Subjective: Patient is feeling better today. Her headache has improved. She denies any dizziness, visual changes, focal extremity weakness, slurred speech. She denies any chest pain, shortness breath, nausea, vomiting, diarrhea. Her abdominal pain has improved after bowel movement.  Objective: Filed Vitals:   01/24/12 1010 01/24/12 1151 01/24/12 1421 01/24/12 1621  BP: 154/59 147/61 136/58 160/69  Pulse: 68 62 63   Temp: 98 F (36.7 C)  97.2 F (36.2 C)   TempSrc: Oral  Oral   Resp: 18  20 18   Height:      Weight:      SpO2: 94%  96%     Intake/Output Summary (Last 24 hours) at 01/24/12 2007 Last data filed at 01/24/12 1818  Gross per 24 hour  Intake   1689 ml  Output    526 ml  Net   1163 ml   Weight change:  Exam:   General:  Pt is alert, follows commands appropriately, not in acute distress  HEENT: No icterus, No thrush, No neck mass, [place]/AT  Cardiovascular: Regular rate and rhythm, S1/S2, no rubs, no gallops  Respiratory: Clear to auscultation bilaterally, no wheezing, no crackles, no rhonchi  Abdomen: Soft, non tender, non distended, bowel sounds present, no guarding  Extremities: No edema, No lymphangitis, No petechiae, No rashes, no synovitis  Data Reviewed: Basic Metabolic Panel:  Lab [id number] 0500 01/23/12 0643  NA 138 138  K 3.2* 3.6  CL 101 99  CO2 28 27  GLUCOSE 105* 163*  BUN 12 18  CREATININE 1.21* 1.03  CALCIUM 9.6 10.7*  MG -- --  PHOS -- --   Liver Function  Tests:  Lab 01/23/12 0643  AST 18  ALT 11  ALKPHOS 46  BILITOT 0.4  PROT 7.4  ALBUMIN 3.4*   No results found for this basename: LIPASE:5,AMYLASE:5 in the last 168 hours No results found for this basename: AMMONIA:5 in the last 168 hours CBC:  Lab 01/24/12 0500 01/23/12 0643  WBC 6.1 7.8  NEUTROABS -- 5.7  HGB 8.8* 9.9*  HCT 25.5* 29.2*  MCV 87.0 86.4  PLT 241 247   Cardiac Enzymes:  Lab 01/23/12 0643  CKTOTAL --  CKMB --  CKMBINDEX --  TROPONINI <0.30   BNP: No components found with this basename: POCBNP:5 CBG:  Lab 01/24/12 1613 01/24/12 1112 01/24/12 0558 01/23/12 2312 01/23/12 1800  GLUCAP 149* 118* 98 123* 225*    No results found for this or any previous visit (from the past 240 hour(s)).   Scheduled Meds:   . amitriptyline  25 mg Oral QHS  . atorvastatin  10 mg Oral q1800  . beta carotene w/minerals  1 tablet Oral Daily  . clonazePAM  1 mg Oral BID  . cloNIDine  0.1 mg Oral TID  . docusate sodium  100 mg Oral BID  . enoxaparin (LOVENOX) injection  40 mg Subcutaneous Q24H  . folic acid  1 mg Oral  BID  . hydrALAZINE  10 mg Intravenous Once  . hydrALAZINE  50 mg Oral q12n4p  . insulin aspart  0-9 Units Subcutaneous TID WC  . labetalol  20 mg Intravenous Once  . lisinopril  20 mg Oral Daily  . metoCLOPramide (REGLAN) injection  5 mg Intravenous Q8H  . NIFEdipine  30 mg Oral Daily  . pantoprazole (PROTONIX) IV  40 mg Intravenous Q24H  . perphenazine  2 mg Oral QHS  . pneumococcal 23 valent vaccine  0.5 mL Intramuscular Tomorrow-1000  . propranolol  60 mg Oral BID  . senna  1 tablet Oral BID  . sodium chloride  3 mL Intravenous Q12H  . sodium chloride  3 mL Intravenous Q12H   Continuous Infusions:    Chelby Salata, DO  [facility] Pager [phone]  If 7PM-7AM, please contact night-coverage [email or web address] Password [email or web address] 01/24/2012, 8:07 PM   LOS: 1 day

## 2012-01-25 LAB — BASIC METABOLIC PANEL
BUN: 15 mg/dL (ref 6–23)
GFR calc Af Amer: 51 mL/min — ABNORMAL LOW (ref >90)
GFR calc non Af Amer: 44 mL/min — ABNORMAL LOW (ref >90)
Potassium: 3.6 mEq/L (ref 3.5–5.1)

## 2012-01-25 LAB — GLUCOSE, CAPILLARY

## 2012-01-25 MED ORDER — HYDRALAZINE HCL 50 MG PO TABS: 50.0000 mg | ORAL_TABLET | Freq: Two times a day (BID) | ORAL | Status: DC

## 2012-01-25 MED ORDER — DSS 100 MG PO CAPS: 100.0000 mg | ORAL_CAPSULE | Freq: Two times a day (BID) | ORAL | Status: AC

## 2012-01-25 MED ORDER — LISINOPRIL 20 MG PO TABS
20.0000 mg | ORAL_TABLET | Freq: Once | ORAL | Status: AC
  Administered 2012-01-25: 20 mg via ORAL
  Filled 2012-01-25: qty 1

## 2012-01-25 MED ORDER — LISINOPRIL 40 MG PO TABS: 40.0000 mg | ORAL_TABLET | Freq: Every day | ORAL | Status: DC

## 2012-01-25 MED ORDER — PANTOPRAZOLE SODIUM 40 MG PO TBEC: 40.0000 mg | DELAYED_RELEASE_TABLET | Freq: Every day | ORAL | Status: DC

## 2012-01-25 MED ORDER — LISINOPRIL 40 MG PO TABS: 40.0000 mg | ORAL_TABLET | Freq: Every day | ORAL | Status: AC

## 2012-01-25 NOTE — Discharge Summary (Signed)
Physician Discharge Summary  Tamara Arnold [id number] DOB: 12/27/1925 DOA: 01/23/2012  PCP: Stephens Shire, MD  Admit date: 01/23/2012 Discharge date: 01/25/2012  Recommendations for Outpatient Follow-up:  1. Pt will need to follow up with PCP in 2 weeks post discharge 2. Please obtain BMP to evaluate electrolytes and kidney function 3. Please also check CBC to evaluate Hg and Hct levels 4. Repeat CXR 1-2wks to f/u on very small pleural effusions  Discharge Diagnoses:  Hypertensive urgency -Improved with counseling on medications and simplifying regimen -CT brain was negative for acute changes -Patient had poor understanding of her medications which probably led to poor compliance -Poor compliance of clonidine likely contributed to rebound hypertension -Antihypertensive regimen was simplified to the following: Hydralazine 50 mg every 12 hours, lisinopril 40 mg daily, Inderal 60 mg twice a day, nifedipine 30 mg daily Abdominal pain  -Likely due to constipation which is exacerbated from her nifedipine as well as clonidine  Headache  -Improved with treatment of hypertension  Constipation  -Patient had 3 bowel movements on the day of discharge which improved her abdominal pain  Major depression  -Stable on home medications  Diabetes mellitus -Patient maintained good glycemic control off of Amaryl -Given patient's age and American diabetic Association guidelines and her hemoglobin A1c on 11/29/2011 of 6.4, I would advocate less tight control  Discharge Condition: Stable  Disposition: Discharge home  Diet:Cardiac prudent  Wt Readings from Last 3 Encounters:  01/25/12 65.318 kg (144 lb)  12/21/11 63.2 kg (139 lb 5.3 oz)  12/02/11 68.992 kg (152 lb 1.6 oz)    History of present illness:  78 year old female presented with headache and abdominal pain. The patient was noted to have hypertensive urgency with a blood pressure of 208/72. She denied any chest pain or shortness of breath  or visual changes or slurred speech.  Hospital Course:  The patient was admitted to the hospital and treated for her hypertensive urgency. CT of the brain did not reveal any acute changes. For her abdominal pain and acute abdominal series showed prominent stool. Chest x-ray showed minuscule small pleural effusions. The patient never had any shortness of breath or chest discomfort or orthopnea or PND. The patient continued to have an oxygen saturation between 95-97% on room air. CT of the abdomen and pelvis revealed a small hernia in the right lower quadrant without any incarceration. There were no other acute findings except for her tiny small pleural effusions. Due to the patient's rebound hypertension, hypertensive urgency, and constipation, a decision was made to wean the patient off of clonidine. The patient was started on Senokot, Colace, and Reglan. The patient had 4 bowel movements throughout her hospitalization with improvement of her abdominal pain. The patient's tolerated cardiac diet on the day of discharge without any complications. The patient's renal function remained at baseline, and the creatinine was 1.12 on the day of discharge. The patient has poor insight on her medications. As a result, her antihypertensive regimen was streamlined. She will go home on clonidine 50 mg twice a day, lisinopril 40 mg daily, Inderal 60 mg twice a day, and nifedipine 30 mg daily. On the day of discharge, the patient's blood pressure on this regimen was much improved. The patient's glycemic control was optimal. Her blood sugars ranged from 92-135 off of Amaryl. A decision was made to stop the patient's Amaryl. Instructions were provided to the patient to follow up with her primary care doctor within 2 weeks for lab work and followup of her  minuscule pleural effusions with followup chest x-ray. Serial troponins and EKGs were negative for acute coronary syndrome. TSH was 2.147 on  12/21/2011.  Consultants: none  Discharge Exam: Filed Vitals:   01/25/12 1347  BP: 140/50  Pulse:   Temp:   Resp:    Filed Vitals:   01/25/12 0100 01/25/12 0448 01/25/12 0942 01/25/12 1347  BP: 153/68 144/65 148/48 140/50  Pulse: 68 65 67   Temp:  97.5 F (36.4 C) 98 F (36.7 C)   TempSrc:  Oral Oral   Resp: 18 18 18    Height:      Weight:  65.318 kg (144 lb)    SpO2: 95% 95% 100%    General: A&O x 3, NAD, pleasant, cooperative Cardiovascular: RRR, no rub, no gallop, no S3 Respiratory: Fine basilar crackles, no wheeze, no rhonchi, no wheezes or rhonchi  Abdomen:soft, nontender, nondistended, positive bowel sounds  Discharge Instructions  Discharge Orders    Future Orders Please Complete By Expires   Diet - low sodium heart healthy      Increase activity slowly      Discharge instructions      Comments:   Do not take amaryl until you follow up with Dr. Tollie Pizza     Medication List  As of 01/25/2012  3:45 PM   STOP taking these medications         cloNIDine 0.1 MG tablet      glimepiride 2 MG tablet         TAKE these medications         amitriptyline 25 MG tablet   Commonly known as: ELAVIL   Take 50 mg by mouth at bedtime.      ascorbic acid 500 MG tablet   Commonly known as: VITAMIN C   Take 500 mg by mouth daily.      aspirin 81 MG chewable tablet   Chew 81 mg by mouth daily.      beta carotene w/minerals tablet   Take 1 tablet by mouth 2 (two) times daily.      multivitamins ther. w/minerals Tabs   Take 1 tablet by mouth daily.      beta carotene w/minerals tablet   Take 1 tablet by mouth daily.      clonazePAM 1 MG tablet   Commonly known as: KLONOPIN   Take 1 mg by mouth 2 (two) times daily. For anxiety      DSS 100 MG Caps   Take 100 mg by mouth 2 (two) times daily.      Fish Oil 1200 MG Caps   Take 2 capsules by mouth daily.      folic acid 1 MG tablet   Commonly known as: FOLVITE   Take 1 mg by mouth 2 (two) times daily.       hydrALAZINE 50 MG tablet   Commonly known as: APRESOLINE   Take 1 tablet (50 mg total) by mouth every 12 (twelve) hours.      lisinopril 40 MG tablet   Commonly known as: PRINIVIL,ZESTRIL   Take 1 tablet (40 mg total) by mouth daily.      meloxicam 15 MG tablet   Commonly known as: MOBIC   Take 15 mg by mouth daily.      NIFEdipine 30 MG 24 hr tablet   Commonly known as: PROCARDIA-XL/ADALAT CC   Take 1 tablet (30 mg total) by mouth daily.      omeprazole 20 MG capsule   Commonly known  as: PRILOSEC   Take 20 mg by mouth daily.      perphenazine-amitriptyline 2-25 MG Tabs   Commonly known as: ETRAFON/TRIAVIL   Take 1 tablet by mouth daily.      propranolol 60 MG tablet   Commonly known as: INDERAL   Take 60 mg by mouth 2 (two) times daily.      rosuvastatin 20 MG tablet   Commonly known as: CRESTOR   Take 20 mg by mouth daily.      vitamin E 400 UNIT capsule   Generic drug: vitamin E   Take 400 Units by mouth daily.             The results of significant diagnostics from this hospitalization (including imaging, microbiology, ancillary and laboratory) are listed below for reference.    Significant Diagnostic Studies: Ct Head Wo Contrast  01/23/2012  *RADIOLOGY REPORT*  Clinical Data: Abdominal pain, shortness of breath and hypertension.  Headache.  CT HEAD WITHOUT CONTRAST  Technique:  Contiguous axial images were obtained from the base of the skull through the vertex without contrast.  Comparison: Brain MRI MRA 12/21/2011.  Head CT 12/20/2011.  Findings: Small 1 cm extra-axial low attenuation area (parallels that of CSF) in the high right parietal vertex is unchanged.  No acute intracranial abnormalities.  Specifically, no definite evidence to suggest acute/subacute cerebral ischemia, no evidence of acute intracranial hemorrhage, no other focal mass, mass effect, hydrocephalus or abnormal intra or extra-axial fluid collections. No acute displaced skull fractures are  identified.  Visualized paranasal sinuses and mastoids are well pneumatized.  IMPRESSION: 1.  No acute intracranial abnormalities.  The appearance of the brain is unchanged, as above.   Original Report Authenticated By: Etheleen Mayhew, M.D.    Ct Abdomen Pelvis W Contrast  01/23/2012  *RADIOLOGY REPORT*  Clinical Data: Abdominal pain.  Short of breath.  CT ABDOMEN AND PELVIS WITH CONTRAST  Technique:  Multidetector CT imaging of the abdomen and pelvis was performed following the standard protocol during bolus administration of intravenous contrast.  Contrast: 142mL OMNIPAQUE IOHEXOL 300 MG/ML  SOLN  Comparison: None.  Findings: Small bilateral pleural effusions right greater than left with minimal basilar atelectasis.  Post cholecystectomy.  Tiny hypodensities in the left lobe of the liver have a benign appearance.  Spleen, pancreas, adrenal glands are within normal limits.  Mild chronic changes of the kidneys.  Tiny curvilinear calcification in the hilum of the spleen on image 90 may represent a small 8 mm aneurysm.  Tiny hypodensities in the kidneys, in the right kidney on image 14 series 5, and in the left kidney on image 8 of series 5 are nonspecific.  Atherosclerotic changes of the aorta and SMA are present.  No obvious occlusion of the SMA.  Right lower quadrant ventral hernia repair with mesh has been performed.  There is a recurrent hernia at the inferior aspect of the mass containing small bowel loops.  Negative free fluid in the abdomen.  No evidence of abnormal adenopathy.  Bladder is mildly distended.  Non-visualized appendix.  There is prominent lumbar degenerative disc disease at multiple levels.  No compression fracture.  IMPRESSION: Recurrent right lower quadrant hernia status post hernia repair. No evidence of bowel obstruction.  Small bilateral pleural effusions.  Small hypodensities in the liver and kidneys as described.  Bladder distention.  Degenerative changes in the lumbar spine.    Original Report Authenticated By: Jamas Lav, M.D.    Dg Abd Acute W/chest  01/23/2012  *RADIOLOGY REPORT*  Clinical Data: Short of breath.  Abdominal pain.  Hypertension.  ACUTE ABDOMEN SERIES (ABDOMEN 2 VIEW & CHEST 1 VIEW)  Comparison: 09/14/2010.  Findings: Stable enlargement of the cardiopericardial silhouette. Small bilateral pleural effusions are present. Monitoring leads are projected over the chest.  No free air underneath the hemidiaphragms. Cholecystectomy clips are present in the right upper quadrant.  Fluid level is present in the stomach.  The bowel gas pattern is within normal limits. Prominent stool is present in the right colon.  Stool and bowel gas extends to the descending colon.  Inferior pelvis not visualized on this radiograph. Right lower quadrant hernia repair tacks are present.  IMPRESSION:  1.  Stable cardiomegaly and small bilateral pleural effusions. 2. No acute abdominal findings.   Original Report Authenticated By: Dereck Ligas, M.D.      Microbiology: No results found for this or any previous visit (from the past 240 hour(s)).   Labs: Basic Metabolic Panel:  Lab [id number] 01/24/12 0500 01/23/12 0643  NA 132* 138 138  K 3.6 3.2* --  CL 96 101 99  CO2 28 28 27   GLUCOSE 103* 105* 163*  BUN 15 12 18   CREATININE 1.12* 1.21* 1.03  CALCIUM 9.0 9.6 10.7*  MG -- -- --  PHOS -- -- --   Liver Function Tests:  Lab 01/23/12 0643  AST 18  ALT 11  ALKPHOS 46  BILITOT 0.4  PROT 7.4  ALBUMIN 3.4*   No results found for this basename: LIPASE:5,AMYLASE:5 in the last 168 hours No results found for this basename: AMMONIA:5 in the last 168 hours CBC:  Lab 01/24/12 0500 01/23/12 0643  WBC 6.1 7.8  NEUTROABS -- 5.7  HGB 8.8* 9.9*  HCT 25.5* 29.2*  MCV 87.0 86.4  PLT 241 247   Cardiac Enzymes:  Lab 01/23/12 0643  CKTOTAL --  CKMB --  CKMBINDEX --  TROPONINI <0.30   BNP: No components found with this basename: POCBNP:5 CBG:  Lab 01/25/12 1129  01/25/12 0549 01/24/12 2153 01/24/12 1613 01/24/12 1112  GLUCAP 135* 92 77 149* 118*    Time coordinating discharge:  Greater than 30 minutes  Signed:  Daziah Hesler, DO [facility] Pager: [phone] 01/25/2012, 3:45 PM

## 2012-01-25 NOTE — Progress Notes (Signed)
Pt and spouse given DC instructions, pt verbalized understanding.  Pt DC home via wc.

## 2012-01-25 NOTE — Progress Notes (Signed)
Pt stated that she fells like her stomach can handle solid food.  Notified Dr. Carles Collet, given an order for a cardiac diet.  Will carry out order and continue to monitor patient.

## 2012-01-26 NOTE — Progress Notes (Signed)
Utilization Review Completed.Donne Anon T9/10/2011

## 2012-07-09 ENCOUNTER — Other Ambulatory Visit: Payer: Self-pay | Admitting: Gastroenterology

## 2012-07-09 DIAGNOSIS — R1310 Dysphagia, unspecified: Secondary | ICD-10-CM

## 2012-07-10 ENCOUNTER — Ambulatory Visit
Admission: RE | Admit: 2012-07-10 | Discharge: 2012-07-10 | Disposition: A | Discharge status: Home / Self Care | Payer: Medicare Other | Source: Ambulatory Visit | Attending: Gastroenterology | Admitting: Gastroenterology

## 2012-09-12 ENCOUNTER — Emergency Department (HOSPITAL_COMMUNITY): Payer: Medicare Other

## 2012-09-12 ENCOUNTER — Observation Stay (HOSPITAL_COMMUNITY)
Admission: EM | Admit: 2012-09-12 | Discharge: 2012-09-13 | Disposition: A | Discharge status: Home / Self Care | Payer: Medicare Other | Attending: Internal Medicine | Admitting: Internal Medicine

## 2012-09-12 ENCOUNTER — Encounter (HOSPITAL_COMMUNITY): Payer: Self-pay | Admitting: Emergency Medicine

## 2012-09-12 DIAGNOSIS — R51 Headache: Secondary | ICD-10-CM

## 2012-09-12 DIAGNOSIS — E785 Hyperlipidemia, unspecified: Secondary | ICD-10-CM

## 2012-09-12 DIAGNOSIS — E876 Hypokalemia: Secondary | ICD-10-CM | POA: Diagnosis present

## 2012-09-12 DIAGNOSIS — E119 Type 2 diabetes mellitus without complications: Secondary | ICD-10-CM | POA: Diagnosis present

## 2012-09-12 DIAGNOSIS — K5900 Constipation, unspecified: Secondary | ICD-10-CM

## 2012-09-12 DIAGNOSIS — IMO0001 Reserved for inherently not codable concepts without codable children: Secondary | ICD-10-CM | POA: Diagnosis present

## 2012-09-12 DIAGNOSIS — R1110 Vomiting, unspecified: Secondary | ICD-10-CM | POA: Diagnosis present

## 2012-09-12 DIAGNOSIS — R1011 Right upper quadrant pain: Secondary | ICD-10-CM | POA: Insufficient documentation

## 2012-09-12 DIAGNOSIS — Z8659 Personal history of other mental and behavioral disorders: Secondary | ICD-10-CM

## 2012-09-12 DIAGNOSIS — R195 Other fecal abnormalities: Secondary | ICD-10-CM | POA: Insufficient documentation

## 2012-09-12 DIAGNOSIS — F329 Major depressive disorder, single episode, unspecified: Secondary | ICD-10-CM | POA: Insufficient documentation

## 2012-09-12 DIAGNOSIS — R930 Abnormal findings on diagnostic imaging of skull and head, not elsewhere classified: Secondary | ICD-10-CM

## 2012-09-12 DIAGNOSIS — I10 Essential (primary) hypertension: Secondary | ICD-10-CM | POA: Diagnosis present

## 2012-09-12 DIAGNOSIS — N179 Acute kidney failure, unspecified: Secondary | ICD-10-CM

## 2012-09-12 DIAGNOSIS — R109 Unspecified abdominal pain: Secondary | ICD-10-CM

## 2012-09-12 DIAGNOSIS — R519 Headache, unspecified: Secondary | ICD-10-CM | POA: Diagnosis present

## 2012-09-12 DIAGNOSIS — K219 Gastro-esophageal reflux disease without esophagitis: Secondary | ICD-10-CM | POA: Insufficient documentation

## 2012-09-12 DIAGNOSIS — R112 Nausea with vomiting, unspecified: Principal | ICD-10-CM | POA: Insufficient documentation

## 2012-09-12 DIAGNOSIS — G43909 Migraine, unspecified, not intractable, without status migrainosus: Secondary | ICD-10-CM

## 2012-09-12 DIAGNOSIS — D649 Anemia, unspecified: Secondary | ICD-10-CM

## 2012-09-12 DIAGNOSIS — E860 Dehydration: Secondary | ICD-10-CM | POA: Diagnosis present

## 2012-09-12 DIAGNOSIS — E871 Hypo-osmolality and hyponatremia: Secondary | ICD-10-CM | POA: Diagnosis present

## 2012-09-12 HISTORY — DX: Low back pain, unspecified: M54.50

## 2012-09-12 HISTORY — DX: Anemia, unspecified: D64.9

## 2012-09-12 HISTORY — DX: Unspecified malignant neoplasm of skin, unspecified: C44.90

## 2012-09-12 HISTORY — DX: Headache, unspecified: R51.9

## 2012-09-12 HISTORY — DX: Type 2 diabetes mellitus without complications: E11.9

## 2012-09-12 HISTORY — DX: Anxiety disorder, unspecified: F41.9

## 2012-09-12 HISTORY — DX: Low back pain: M54.5

## 2012-09-12 HISTORY — DX: Unspecified osteoarthritis, unspecified site: M19.90

## 2012-09-12 HISTORY — DX: Gastro-esophageal reflux disease without esophagitis: K21.9

## 2012-09-12 HISTORY — DX: Headache: R51

## 2012-09-12 HISTORY — DX: Other chronic pain: G89.29

## 2012-09-12 LAB — CBC
MCH: 30.9 pg (ref 26.0–34.0)
MCHC: 38 g/dL — ABNORMAL HIGH (ref 30.0–36.0)
MCV: 81.4 fL (ref 78.0–100.0)
Platelets: 258 10*3/uL (ref 150–400)
RDW: 12.2 % (ref 11.5–15.5)
WBC: 12.4 10*3/uL — ABNORMAL HIGH (ref 4.0–10.5)

## 2012-09-12 LAB — URINALYSIS, ROUTINE W REFLEX MICROSCOPIC
Glucose, UA: NEGATIVE mg/dL
Leukocytes, UA: NEGATIVE
Nitrite: NEGATIVE
pH: 6.5 (ref 5.0–8.0)

## 2012-09-12 LAB — URINE MICROSCOPIC-ADD ON

## 2012-09-12 LAB — COMPREHENSIVE METABOLIC PANEL
AST: 23 U/L (ref 0–37)
Albumin: 3.8 g/dL (ref 3.5–5.2)
Calcium: 10.2 mg/dL (ref 8.4–10.5)
Chloride: 88 mEq/L — ABNORMAL LOW (ref 96–112)
Creatinine, Ser: 0.78 mg/dL (ref 0.50–1.10)
Total Protein: 7.5 g/dL (ref 6.0–8.3)

## 2012-09-12 LAB — LIPASE, BLOOD: Lipase: 46 U/L (ref 11–59)

## 2012-09-12 LAB — POCT I-STAT, CHEM 8
BUN: 16 mg/dL (ref 6–23)
Chloride: 90 mEq/L — ABNORMAL LOW (ref 96–112)
HCT: 36 % (ref 36.0–46.0)
Sodium: 127 mEq/L — ABNORMAL LOW (ref 135–145)
TCO2: 26 mmol/L (ref 0–100)

## 2012-09-12 LAB — GLUCOSE, CAPILLARY: Glucose-Capillary: 136 mg/dL — ABNORMAL HIGH (ref 70–99)

## 2012-09-12 MED ORDER — HYDRALAZINE HCL 20 MG/ML IJ SOLN
10.0000 mg | Freq: Four times a day (QID) | INTRAMUSCULAR | Status: DC | PRN
  Administered 2012-09-12: 10 mg via INTRAVENOUS
  Filled 2012-09-12: qty 1

## 2012-09-12 MED ORDER — FOLIC ACID 1 MG PO TABS
1.0000 mg | ORAL_TABLET | Freq: Two times a day (BID) | ORAL | Status: DC
  Administered 2012-09-13: 1 mg via ORAL
  Filled 2012-09-12 (×3): qty 1

## 2012-09-12 MED ORDER — ONDANSETRON HCL 4 MG/2ML IJ SOLN
4.0000 mg | Freq: Once | INTRAMUSCULAR | Status: AC
  Administered 2012-09-12: 4 mg via INTRAVENOUS

## 2012-09-12 MED ORDER — FENOFIBRATE 160 MG PO TABS
160.0000 mg | ORAL_TABLET | Freq: Every day | ORAL | Status: DC
  Administered 2012-09-13: 160 mg via ORAL
  Filled 2012-09-12 (×2): qty 1

## 2012-09-12 MED ORDER — ONDANSETRON HCL 4 MG/2ML IJ SOLN
4.0000 mg | Freq: Once | INTRAMUSCULAR | Status: AC
  Administered 2012-09-12: 4 mg via INTRAVENOUS
  Filled 2012-09-12: qty 2

## 2012-09-12 MED ORDER — FENTANYL CITRATE 0.05 MG/ML IJ SOLN
50.0000 ug | Freq: Once | INTRAMUSCULAR | Status: AC
  Administered 2012-09-12: 50 ug via INTRAVENOUS
  Filled 2012-09-12: qty 2

## 2012-09-12 MED ORDER — INSULIN ASPART 100 UNIT/ML ~~LOC~~ SOLN
0.0000 [IU] | Freq: Three times a day (TID) | SUBCUTANEOUS | Status: DC
  Administered 2012-09-12: 1 [IU] via SUBCUTANEOUS

## 2012-09-12 MED ORDER — ONDANSETRON HCL 4 MG/2ML IJ SOLN
4.0000 mg | Freq: Four times a day (QID) | INTRAMUSCULAR | Status: DC | PRN
  Administered 2012-09-12: 4 mg via INTRAVENOUS
  Filled 2012-09-12: qty 2

## 2012-09-12 MED ORDER — KETOROLAC TROMETHAMINE 15 MG/ML IJ SOLN: 15.0000 mg | Freq: Four times a day (QID) | INTRAMUSCULAR | Status: DC | PRN

## 2012-09-12 MED ORDER — PROMETHAZINE HCL 25 MG/ML IJ SOLN
25.0000 mg | Freq: Four times a day (QID) | INTRAMUSCULAR | Status: DC | PRN
  Administered 2012-09-12: 25 mg via INTRAVENOUS
  Filled 2012-09-12: qty 1

## 2012-09-12 MED ORDER — PERPHENAZINE 2 MG PO TABS
2.0000 mg | ORAL_TABLET | Freq: Every day | ORAL | Status: DC
  Administered 2012-09-13: 2 mg via ORAL
  Filled 2012-09-12 (×3): qty 1

## 2012-09-12 MED ORDER — FERROUS SULFATE 325 (65 FE) MG PO TABS
325.0000 mg | ORAL_TABLET | Freq: Every day | ORAL | Status: DC
  Administered 2012-09-13: 325 mg via ORAL
  Filled 2012-09-12 (×2): qty 1

## 2012-09-12 MED ORDER — ALBUTEROL SULFATE (5 MG/ML) 0.5% IN NEBU: 2.5000 mg | INHALATION_SOLUTION | RESPIRATORY_TRACT | Status: DC | PRN

## 2012-09-12 MED ORDER — ONDANSETRON HCL 4 MG PO TABS: 4.0000 mg | ORAL_TABLET | Freq: Four times a day (QID) | ORAL | Status: DC | PRN

## 2012-09-12 MED ORDER — HYDRALAZINE HCL 50 MG PO TABS
50.0000 mg | ORAL_TABLET | Freq: Three times a day (TID) | ORAL | Status: DC
  Administered 2012-09-12 – 2012-09-13 (×2): 50 mg via ORAL
  Filled 2012-09-12 (×4): qty 1

## 2012-09-12 MED ORDER — KETOROLAC TROMETHAMINE 15 MG/ML IJ SOLN
INTRAMUSCULAR | Status: AC
  Administered 2012-09-12: 15 mg
  Filled 2012-09-12: qty 1

## 2012-09-12 MED ORDER — CALCIUM CARBONATE-VITAMIN D 500-200 MG-UNIT PO TABS
1.0000 | ORAL_TABLET | Freq: Every day | ORAL | Status: DC
  Administered 2012-09-13: 1 via ORAL
  Filled 2012-09-12: qty 1

## 2012-09-12 MED ORDER — ALUM & MAG HYDROXIDE-SIMETH 200-200-20 MG/5ML PO SUSP
30.0000 mL | Freq: Four times a day (QID) | ORAL | Status: DC | PRN
  Administered 2012-09-13: 30 mL via ORAL
  Filled 2012-09-12: qty 30

## 2012-09-12 MED ORDER — THERA M PLUS PO TABS: 1.0000 | ORAL_TABLET | Freq: Every day | ORAL | Status: DC

## 2012-09-12 MED ORDER — BUTALBITAL-APAP-CAFFEINE 50-325-40 MG PO TABS
2.0000 | ORAL_TABLET | Freq: Four times a day (QID) | ORAL | Status: DC | PRN
  Administered 2012-09-12 (×2): 2 via ORAL
  Filled 2012-09-12 (×2): qty 2

## 2012-09-12 MED ORDER — ATORVASTATIN CALCIUM 40 MG PO TABS
40.0000 mg | ORAL_TABLET | Freq: Every day | ORAL | Status: DC
  Filled 2012-09-12: qty 1

## 2012-09-12 MED ORDER — AMITRIPTYLINE HCL 50 MG PO TABS
50.0000 mg | ORAL_TABLET | Freq: Every day | ORAL | Status: DC
  Filled 2012-09-12: qty 1

## 2012-09-12 MED ORDER — ADULT MULTIVITAMIN W/MINERALS CH
1.0000 | ORAL_TABLET | Freq: Every day | ORAL | Status: DC
  Administered 2012-09-13: 1 via ORAL
  Filled 2012-09-12: qty 1

## 2012-09-12 MED ORDER — ACETAMINOPHEN 325 MG PO TABS: 650.0000 mg | ORAL_TABLET | Freq: Four times a day (QID) | ORAL | Status: DC | PRN

## 2012-09-12 MED ORDER — POTASSIUM CHLORIDE CRYS ER 20 MEQ PO TBCR
40.0000 meq | EXTENDED_RELEASE_TABLET | Freq: Once | ORAL | Status: AC
  Administered 2012-09-12: 40 meq via ORAL
  Filled 2012-09-12: qty 2

## 2012-09-12 MED ORDER — AMITRIPTYLINE HCL 25 MG PO TABS
25.0000 mg | ORAL_TABLET | Freq: Every day | ORAL | Status: DC
  Administered 2012-09-13: 25 mg via ORAL
  Filled 2012-09-12 (×4): qty 1

## 2012-09-12 MED ORDER — IOHEXOL 300 MG/ML  SOLN: 50.0000 mL | Freq: Once | INTRAMUSCULAR | Status: DC | PRN

## 2012-09-12 MED ORDER — SODIUM CHLORIDE 0.9 % IV SOLN
INTRAVENOUS | Status: DC
  Administered 2012-09-12 – 2012-09-13 (×2): via INTRAVENOUS

## 2012-09-12 MED ORDER — ASPIRIN 81 MG PO CHEW
81.0000 mg | CHEWABLE_TABLET | Freq: Every day | ORAL | Status: DC
  Administered 2012-09-13: 81 mg via ORAL
  Filled 2012-09-12: qty 1

## 2012-09-12 MED ORDER — ONDANSETRON HCL 4 MG/2ML IJ SOLN
INTRAMUSCULAR | Status: AC
  Filled 2012-09-12: qty 2

## 2012-09-12 MED ORDER — POTASSIUM CHLORIDE 10 MEQ/100ML IV SOLN
10.0000 meq | Freq: Once | INTRAVENOUS | Status: AC
  Administered 2012-09-12: 10 meq via INTRAVENOUS
  Filled 2012-09-12: qty 100

## 2012-09-12 MED ORDER — PERPHENAZINE-AMITRIPTYLINE 2-25 MG PO TABS
1.0000 | ORAL_TABLET | Freq: Every day | ORAL | Status: DC
  Administered 2012-09-13: 1 via ORAL
  Filled 2012-09-12 (×2): qty 1

## 2012-09-12 MED ORDER — PROMETHAZINE HCL 12.5 MG PO TABS
12.5000 mg | ORAL_TABLET | Freq: Once | ORAL | Status: AC
  Administered 2012-09-12: 12.5 mg via ORAL
  Filled 2012-09-12: qty 1

## 2012-09-12 MED ORDER — PROPRANOLOL HCL 60 MG PO TABS
60.0000 mg | ORAL_TABLET | Freq: Two times a day (BID) | ORAL | Status: DC
  Administered 2012-09-12 – 2012-09-13 (×2): 60 mg via ORAL
  Filled 2012-09-12 (×4): qty 1

## 2012-09-12 MED ORDER — METOCLOPRAMIDE HCL 5 MG/ML IJ SOLN
10.0000 mg | Freq: Once | INTRAMUSCULAR | Status: AC
  Administered 2012-09-12: 10 mg via INTRAVENOUS
  Filled 2012-09-12: qty 2

## 2012-09-12 MED ORDER — CALCIUM-VITAMIN D 500-200 MG-UNIT PO TABS: 1.0000 | ORAL_TABLET | Freq: Every day | ORAL | Status: DC

## 2012-09-12 MED ORDER — BUTORPHANOL TARTRATE 10 MG/ML NA SOLN: 1.0000 | NASAL | Status: DC | PRN

## 2012-09-12 MED ORDER — SODIUM CHLORIDE 0.9 % IV BOLUS (SEPSIS)
1000.0000 mL | Freq: Once | INTRAVENOUS | Status: AC
  Administered 2012-09-12: 1000 mL via INTRAVENOUS

## 2012-09-12 MED ORDER — IOHEXOL 300 MG/ML  SOLN
100.0000 mL | Freq: Once | INTRAMUSCULAR | Status: AC | PRN
  Administered 2012-09-12: 100 mL via INTRAVENOUS

## 2012-09-12 MED ORDER — PANTOPRAZOLE SODIUM 40 MG IV SOLR
40.0000 mg | Freq: Once | INTRAVENOUS | Status: AC
  Administered 2012-09-12: 40 mg via INTRAVENOUS
  Filled 2012-09-12: qty 40

## 2012-09-12 MED ORDER — ENOXAPARIN SODIUM 40 MG/0.4ML ~~LOC~~ SOLN
40.0000 mg | Freq: Every day | SUBCUTANEOUS | Status: DC
  Administered 2012-09-12 – 2012-09-13 (×2): 40 mg via SUBCUTANEOUS
  Filled 2012-09-12 (×2): qty 0.4

## 2012-09-12 MED ORDER — SODIUM CHLORIDE 0.9 % IV SOLN
INTRAVENOUS | Status: DC
  Administered 2012-09-12 (×2): via INTRAVENOUS

## 2012-09-12 MED ORDER — AMLODIPINE BESYLATE 5 MG PO TABS
5.0000 mg | ORAL_TABLET | Freq: Every day | ORAL | Status: DC
  Administered 2012-09-13: 5 mg via ORAL
  Filled 2012-09-12: qty 1

## 2012-09-12 MED ORDER — CLONAZEPAM 1 MG PO TABS
1.0000 mg | ORAL_TABLET | Freq: Two times a day (BID) | ORAL | Status: DC
  Administered 2012-09-12 – 2012-09-13 (×2): 1 mg via ORAL
  Filled 2012-09-12 (×2): qty 1

## 2012-09-12 MED ORDER — LISINOPRIL 40 MG PO TABS
40.0000 mg | ORAL_TABLET | Freq: Every day | ORAL | Status: DC
  Administered 2012-09-13: 40 mg via ORAL
  Filled 2012-09-12 (×2): qty 1

## 2012-09-12 MED ORDER — ACETAMINOPHEN 650 MG RE SUPP: 650.0000 mg | Freq: Four times a day (QID) | RECTAL | Status: DC | PRN

## 2012-09-12 MED ORDER — PANTOPRAZOLE SODIUM 40 MG IV SOLR
40.0000 mg | INTRAVENOUS | Status: DC
  Administered 2012-09-12: 40 mg via INTRAVENOUS
  Filled 2012-09-12 (×2): qty 40

## 2012-09-12 NOTE — Progress Notes (Signed)
Po meds not given, patient unable to tolerate po due to nausea

## 2012-09-12 NOTE — ED Notes (Signed)
MD at bedside. 

## 2012-09-12 NOTE — ED Notes (Signed)
Pt unable to give urine for sample at this time

## 2012-09-12 NOTE — ED Notes (Signed)
Pt states abdomen is tender upon palpation in RUQ.

## 2012-09-12 NOTE — ED Provider Notes (Signed)
Care assumed at sign out. Patient with hx of abdominal surgeries here with ab pain, vomiting. Xray showed paucity of air in bowel. CT ab/pel ordered. Showed no SBO. Patient still unable to tolerate PO despite IVF and multiple rounds of antiemetic. Will admit for observation for hydration. I discussed with Dr. Sloan Leiter.   Results for orders placed during the hospital encounter of 09/12/12  CBC      Result Value Range   WBC 12.4 (*) 4.0 - 10.5 K/uL   RBC 3.88  3.87 - 5.11 MIL/uL   Hemoglobin 12.0  12.0 - 15.0 g/dL   HCT 31.6 (*) 36.0 - 46.0 %   MCV 81.4  78.0 - 100.0 fL   MCH 30.9  26.0 - 34.0 pg   MCHC 38.0 (*) 30.0 - 36.0 g/dL   RDW 12.2  11.5 - 15.5 %   Platelets 258  150 - 400 K/uL  COMPREHENSIVE METABOLIC PANEL      Result Value Range   Sodium 129 (*) 135 - 145 mEq/L   Potassium 3.2 (*) 3.5 - 5.1 mEq/L   Chloride 88 (*) 96 - 112 mEq/L   CO2 26  19 - 32 mEq/L   Glucose, Bld 210 (*) 70 - 99 mg/dL   BUN 18  6 - 23 mg/dL   Creatinine, Ser 0.78  0.50 - 1.10 mg/dL   Calcium 10.2  8.4 - 10.5 mg/dL   Total Protein 7.5  6.0 - 8.3 g/dL   Albumin 3.8  3.5 - 5.2 g/dL   AST 23  0 - 37 U/L   ALT 14  0 - 35 U/L   Alkaline Phosphatase 40  39 - 117 U/L   Total Bilirubin 0.4  0.3 - 1.2 mg/dL   GFR calc non Af Amer 74 (*) >90 mL/min   GFR calc Af Amer 86 (*) >90 mL/min  LIPASE, BLOOD      Result Value Range   Lipase 46  11 - 59 U/L  POCT I-STAT, CHEM 8      Result Value Range   Sodium 127 (*) 135 - 145 mEq/L   Potassium 3.3 (*) 3.5 - 5.1 mEq/L   Chloride 90 (*) 96 - 112 mEq/L   BUN 16  6 - 23 mg/dL   Creatinine, Ser 0.90  0.50 - 1.10 mg/dL   Glucose, Bld 214 (*) 70 - 99 mg/dL   Calcium, Ion 1.18  1.13 - 1.30 mmol/L   TCO2 26  0 - 100 mmol/L   Hemoglobin 12.2  12.0 - 15.0 g/dL   HCT 36.0  36.0 - 46.0 %   Ct Abdomen Pelvis W Contrast  09/12/2012  *RADIOLOGY REPORT*  Clinical Data: Right upper quadrant abdominal pain.  Emesis.  CT ABDOMEN AND PELVIS WITH CONTRAST  Technique:   Multidetector CT imaging of the abdomen and pelvis was performed following the standard protocol during bolus administration of intravenous contrast.  Contrast: 12mL OMNIPAQUE IOHEXOL 300 MG/ML  SOLN  Comparison: Acute abdominal series 09/12/2012.  Findings: The lung bases are clear without focal nodule, mass, or airspace disease.  The heart is mildly enlarged.  No significant pleural or pericardial effusion present.  A 7 mm hypodense lesion along the falciform ligament is likely represents a simple cyst.  The liver is otherwise unremarkable. The spleen is within normal limits.  The stomach is mildly distended without evidence for obstruction.  The duodenum and pancreas are unremarkable.  The common bile duct is within normal limits following cholecystectomy.  The  adrenal glands are normal bilaterally.  The kidneys and ureters are within normal limits bilaterally.  A single sub centimeter cyst is present in both kidneys.  Atherosclerotic calcifications are present in the aorta and branch vessels without aneurysm.  The rectosigmoid colon is mostly collapsed.  The more proximal colon is within normal limits.  The appendix is not discretely visualized and may be surgically absent.  The patient is status post hysterectomy.  The ovaries are not definitively seen and may be surgically absent as well.  Urinary bladder is mildly distended. No obstructing lesion is evident.  The patient is status post ventral hernia repair.  There is a loop of small bowel extending into recurrent hernia along the inferior margin of the repair.  There is no evidence for obstruction.  The adjacent small bowel is within normal limits.  Leftward curvature of the lumbar spine is centered at L3.  There is rightward curvature in the lower thoracic spine.  Multilevel endplate changes are noted.  Multilevel facet disease is present as well.  Right foraminal stenosis is present at L1-2 and L2-3.  Left- sided disease is most evident at L4-5 and L5-S1.   IMPRESSION:  1.  Recurrent ventral hernia along the inferior aspect of a previous mesh repair contains at least one loop of small bowel without evidence for obstruction. 2.  No other acute or focal abnormality to explain the patient's symptoms. 3.  Atherosclerosis.  4.  Status post cholecystectomy. 5.  Moderate lumbar spondylosis and scoliosis as described.   Original Report Authenticated By: San Morelle, M.D.    Dg Abd Acute W/chest  09/12/2012  *RADIOLOGY REPORT*  Clinical Data: Abdominal pain, emesis  ACUTE ABDOMEN SERIES (ABDOMEN 2 VIEW & CHEST 1 VIEW)  Comparison: 01/23/2012  Findings: Heart size upper normal to mildly enlarged.  Mild left lung base/lingular opacity.  No free intraperitoneal air.  Relative paucity of small bowel gas, therefore cannot exclude obstruction.  Evidence of prior hernia repair with mesh coils noted over the right pelvis.  Surgical clips right upper quadrant suggest prior cholecystectomy.  Multilevel degenerative change in mild leftward curvature of the lumbar spine.  IMPRESSION: Nonspecific bowel gas pattern with relative paucity of bowel gas, can be seen in the setting of decompressed or fluid filled loops.  Mild left lung base/lingular opacity; atelectasis versus infiltrate.   Original Report Authenticated By: Carlos Levering, M.D.       Wandra Arthurs, MD 09/12/12 1124

## 2012-09-12 NOTE — ED Notes (Signed)
Pt transported to XR.  

## 2012-09-12 NOTE — ED Notes (Signed)
Pt ate dinner last night approx 7pm, and has had constant nausea and vomiting. Pt states she is also having more bowel movements tonight than normal, pt states it isn't diarrhea, but soft. Pt denies blood in stool. Pt takes iron and has dark stools. Pt denies abdominal pain.

## 2012-09-12 NOTE — ED Provider Notes (Signed)
History     CSN: [id number]  Arrival date & time 09/12/12  0434   First MD Initiated Contact with Patient 09/12/12 [phone]      Chief Complaint  Patient presents with  . Nausea  . Emesis    (Consider location/radiation/quality/duration/timing/severity/associated sxs/prior treatment) HPI HX per PT, N/V since 7pm last night unable to hold anything down, has some associated ABD burning discomfort, no diraahea, has had some soft stools, no blood, takes iron and has unchanged dark stool.no F/C, no sick contacts, no CP or SOB, h/o multiple ABD surgeries, denies any ABD distention, symptoms moderate in severity. She is worried that she has food poisoning from something she ate last night.  Past Medical History  Diagnosis Date  . Hypertension   . Diabetes mellitus   . Migraine   . Reflux   . Hyperlipidemia     Past Surgical History  Procedure Laterality Date  . Cholecystectomy    . Abdominal hysterectomy    . Appendectomy    . Hernia repair    . Cystectomy    . Cataract extraction      Family History  Problem Relation Age of Onset  . Diabetes type II Father     History  Substance Use Topics  . Smoking status: Never Smoker   . Smokeless tobacco: Not on file  . Alcohol Use: No    OB History   Grav Para Term Preterm Abortions TAB SAB Ect Mult Living                  Review of Systems  Constitutional: Negative for fever and chills.  HENT: Negative for neck pain and neck stiffness.   Eyes: Negative for pain.  Respiratory: Negative for shortness of breath.   Cardiovascular: Negative for chest pain.  Gastrointestinal: Positive for vomiting and abdominal pain.  Genitourinary: Negative for dysuria.  Musculoskeletal: Negative for back pain.  Skin: Negative for rash.  Neurological: Negative for headaches.  All other systems reviewed and are negative.    Allergies  Gabapentin; Prednisone; Topiramate; and Wellbutrin  Home Medications   Current Outpatient Rx  Name   Route  Sig  Dispense  Refill  . amitriptyline (ELAVIL) 25 MG tablet   Oral   Take 50 mg by mouth at bedtime.         Marland Kitchen amLODipine (NORVASC) 5 MG tablet   Oral   Take 5 mg by mouth 2 (two) times daily.         Marland Kitchen ascorbic acid (VITAMIN C) 500 MG tablet   Oral   Take 500 mg by mouth daily.           Marland Kitchen aspirin 81 MG chewable tablet   Oral   Chew 81 mg by mouth daily.           . beta carotene w/minerals (OCUVITE) tablet   Oral   Take 1 tablet by mouth daily.         . butalbital-acetaminophen-caffeine (FIORICET, ESGIC) 50-325-40 MG per tablet   Oral   Take 0.5-1 tablets by mouth 2 (two) times daily as needed for headache.         . butorphanol (STADOL) 10 MG/ML nasal spray   Nasal   Place 1 spray into the nose every 4 (four) hours as needed (headache).         . Calcium Carbonate-Vitamin D (CALCIUM-VITAMIN D) 500-200 MG-UNIT per tablet   Oral   Take 1 tablet by mouth 2 (two) times  daily with a meal.         . clonazePAM (KLONOPIN) 1 MG tablet   Oral   Take 1 mg by mouth 2 (two) times daily. For anxiety         . fenofibrate 160 MG tablet   Oral   Take 160 mg by mouth daily.         . ferrous sulfate 325 (65 FE) MG tablet   Oral   Take 325 mg by mouth daily with breakfast.         . folic acid (FOLVITE) 1 MG tablet   Oral   Take 1 mg by mouth 2 (two) times daily.           . hydrALAZINE (APRESOLINE) 50 MG tablet   Oral   Take 50 mg by mouth 3 (three) times daily.         . hydrochlorothiazide (HYDRODIURIL) 25 MG tablet   Oral   Take 12.5 mg by mouth daily.         Marland Kitchen lisinopril (PRINIVIL,ZESTRIL) 40 MG tablet   Oral   Take 1 tablet (40 mg total) by mouth daily.   30 tablet   2   . Multiple Vitamins-Minerals (MULTIVITAMINS THER. W/MINERALS) TABS   Oral   Take 1 tablet by mouth daily.           . Omega-3 Fatty Acids (FISH OIL) 1200 MG CAPS   Oral   Take 2 capsules by mouth daily.         Marland Kitchen omeprazole (PRILOSEC) 20 MG  capsule   Oral   Take 20 mg by mouth daily.           Marland Kitchen perphenazine-amitriptyline (ETRAFON/TRIAVIL) 2-25 MG TABS   Oral   Take 1 tablet by mouth daily.           . propranolol (INDERAL) 60 MG tablet   Oral   Take 60 mg by mouth 2 (two) times daily.         . rosuvastatin (CRESTOR) 20 MG tablet   Oral   Take 20 mg by mouth daily.         . vitamin E (VITAMIN E) 400 UNIT capsule   Oral   Take 400 Units by mouth daily.             BP 166/55  Pulse 85  Temp(Src) 98.5 F (36.9 C) (Oral)  Resp 10  SpO2 100%  Physical Exam  Constitutional: She is oriented to person, place, and time. She appears well-developed and well-nourished.  HENT:  Head: Normocephalic and atraumatic.  Eyes: EOM are normal. Pupils are equal, round, and reactive to light.  Neck: Neck supple.  Cardiovascular: Normal rate, regular rhythm and intact distal pulses.   Pulmonary/Chest: Effort normal and breath sounds normal. No respiratory distress.  Abdominal: Soft. Bowel sounds are normal. She exhibits no distension. There is no rebound and no guarding.  Mild upper ABD tenderness  Musculoskeletal: Normal range of motion. She exhibits no edema.  Neurological: She is alert and oriented to person, place, and time.  Skin: Skin is warm and dry.    ED Course  Procedures (including critical care time)  Results for orders placed during the hospital encounter of 09/12/12  CBC      Result Value Range   WBC 12.4 (*) 4.0 - 10.5 K/uL   RBC 3.88  3.87 - 5.11 MIL/uL   Hemoglobin 12.0  12.0 - 15.0 g/dL   HCT 31.6 (*)  36.0 - 46.0 %   MCV 81.4  78.0 - 100.0 fL   MCH 30.9  26.0 - 34.0 pg   MCHC 38.0 (*) 30.0 - 36.0 g/dL   RDW 12.2  11.5 - 15.5 %   Platelets 258  150 - 400 K/uL  COMPREHENSIVE METABOLIC PANEL      Result Value Range   Sodium 129 (*) 135 - 145 mEq/L   Potassium 3.2 (*) 3.5 - 5.1 mEq/L   Chloride 88 (*) 96 - 112 mEq/L   CO2 26  19 - 32 mEq/L   Glucose, Bld 210 (*) 70 - 99 mg/dL   BUN 18   6 - 23 mg/dL   Creatinine, Ser 0.78  0.50 - 1.10 mg/dL   Calcium 10.2  8.4 - 10.5 mg/dL   Total Protein 7.5  6.0 - 8.3 g/dL   Albumin 3.8  3.5 - 5.2 g/dL   AST 23  0 - 37 U/L   ALT 14  0 - 35 U/L   Alkaline Phosphatase 40  39 - 117 U/L   Total Bilirubin 0.4  0.3 - 1.2 mg/dL   GFR calc non Af Amer 74 (*) >90 mL/min   GFR calc Af Amer 86 (*) >90 mL/min  LIPASE, BLOOD      Result Value Range   Lipase 46  11 - 59 U/L  POCT I-STAT, CHEM 8      Result Value Range   Sodium 127 (*) 135 - 145 mEq/L   Potassium 3.3 (*) 3.5 - 5.1 mEq/L   Chloride 90 (*) 96 - 112 mEq/L   BUN 16  6 - 23 mg/dL   Creatinine, Ser 0.90  0.50 - 1.10 mg/dL   Glucose, Bld 214 (*) 70 - 99 mg/dL   Calcium, Ion 1.18  1.13 - 1.30 mmol/L   TCO2 26  0 - 100 mmol/L   Hemoglobin 12.2  12.0 - 15.0 g/dL   HCT 36.0  36.0 - 46.0 %   Dg Abd Acute W/chest  09/12/2012  *RADIOLOGY REPORT*  Clinical Data: Abdominal pain, emesis  ACUTE ABDOMEN SERIES (ABDOMEN 2 VIEW & CHEST 1 VIEW)  Comparison: 01/23/2012  Findings: Heart size upper normal to mildly enlarged.  Mild left lung base/lingular opacity.  No free intraperitoneal air.  Relative paucity of small bowel gas, therefore cannot exclude obstruction.  Evidence of prior hernia repair with mesh coils noted over the right pelvis.  Surgical clips right upper quadrant suggest prior cholecystectomy.  Multilevel degenerative change in mild leftward curvature of the lumbar spine.  IMPRESSION: Nonspecific bowel gas pattern with relative paucity of bowel gas, can be seen in the setting of decompressed or fluid filled loops.  Mild left lung base/lingular opacity; atelectasis versus infiltrate.   Original Report Authenticated By: Carlos Levering, M.D.      Date: 09/12/2012  Rate: 83  Rhythm: normal sinus rhythm  QRS Axis: normal  Intervals: normal  ST/T Wave abnormalities: nonspecific ST changes  Conduction Disutrbances:none  Narrative Interpretation:   Old EKG Reviewed: unchanged   7:05  AM still symptomatic, CT scan ordered MDM  ABD pain N/V in PT with multiple ABD sugeries  IVFs, IV narcotics, IV zofran  Imaging, labs, serial evaluations        Teressa Lower, MD 09/12/12 [phone]

## 2012-09-12 NOTE — H&P (Signed)
PATIENT DETAILS Name: Tamara Arnold Age: 79 y.o. Sex: female Date of Birth: 03/25/1926 Admit Date: 09/12/2012 [id number] A, MD   CHIEF COMPLAINT:  Vomiting since yesterday evening  HPI: Tamara Arnold is a 79 y.o. female with a Past Medical History of chronic migraine headaches, hypertension, diabetes who presents today with the above noted complaint. The patient, she was in his usual state of health, she noted that her last evening she started feeling nauseous and then proceeded to have numerous episodes of vomiting. Vomitus is nonbilious and nonbloody. She claims that she did not have diarrhea-but did have some frequent stools today. She claims to have mild upper abdominal discomfort. There is no history of fever. She claims, that none of her family members or friends whom she was with yesterday had similar symptoms. She was evaluated in the emergency room, given supportive care and numerous doses of anti-emetics, however she continues to feel nauseous, I was subsequently asked to admit this patient for further evaluation and treatment. -Per patient, her last vomiting episode was just before she got to the emergency room. Since then she has been persistently nauseous and is not able to take any liquids at all. She denies any chest pain or shortness of breath. She has chronic daily headaches that occurred at baseline.   ALLERGIES:   Allergies  Allergen Reactions  . Gabapentin Other (See Comments)    headaches  . Prednisone Other (See Comments)    Headaches-all steroids  . Topiramate Other (See Comments)    Makes headaches worse  . Wellbutrin (Bupropion Hcl) Other (See Comments)    Caused headaches    PAST MEDICAL HISTORY: Past Medical History  Diagnosis Date  . Hypertension   . Diabetes mellitus   . Migraine   . Reflux   . Hyperlipidemia     PAST SURGICAL HISTORY: Past Surgical History  Procedure Laterality Date  . Cholecystectomy    . Abdominal hysterectomy     . Appendectomy    . Hernia repair    . Cystectomy    . Cataract extraction      MEDICATIONS AT HOME: Prior to Admission medications   Medication Sig Start Date End Date Taking? Authorizing Provider  amitriptyline (ELAVIL) 25 MG tablet Take 50 mg by mouth at bedtime.   Yes Historical Provider, MD  amLODipine (NORVASC) 5 MG tablet Take 5 mg by mouth 2 (two) times daily.   Yes Historical Provider, MD  ascorbic acid (VITAMIN C) 500 MG tablet Take 500 mg by mouth daily.     Yes Historical Provider, MD  aspirin 81 MG chewable tablet Chew 81 mg by mouth daily.     Yes Historical Provider, MD  beta carotene w/minerals (OCUVITE) tablet Take 1 tablet by mouth daily.   Yes Historical Provider, MD  butalbital-acetaminophen-caffeine (FIORICET, ESGIC) 50-325-40 MG per tablet Take 0.5-1 tablets by mouth 2 (two) times daily as needed for headache.   Yes Historical Provider, MD  butorphanol (STADOL) 10 MG/ML nasal spray Place 1 spray into the nose every 4 (four) hours as needed (headache).   Yes Historical Provider, MD  Calcium Carbonate-Vitamin D (CALCIUM-VITAMIN D) 500-200 MG-UNIT per tablet Take 1 tablet by mouth 2 (two) times daily with a meal.   Yes Historical Provider, MD  clonazePAM (KLONOPIN) 1 MG tablet Take 1 mg by mouth 2 (two) times daily. For anxiety   Yes Historical Provider, MD  fenofibrate 160 MG tablet Take 160 mg by mouth daily.   Yes Historical Provider, MD  ferrous sulfate 325 (65 FE) MG tablet Take 325 mg by mouth daily with breakfast.   Yes Historical Provider, MD  folic acid (FOLVITE) 1 MG tablet Take 1 mg by mouth 2 (two) times daily.     Yes Historical Provider, MD  hydrALAZINE (APRESOLINE) 50 MG tablet Take 50 mg by mouth 3 (three) times daily.   Yes Historical Provider, MD  hydrochlorothiazide (HYDRODIURIL) 25 MG tablet Take 12.5 mg by mouth daily.   Yes Historical Provider, MD  lisinopril (PRINIVIL,ZESTRIL) 40 MG tablet Take 1 tablet (40 mg total) by mouth daily. 01/25/12 01/24/13 Yes  Orson Eva, MD  Multiple Vitamins-Minerals (MULTIVITAMINS THER. W/MINERALS) TABS Take 1 tablet by mouth daily.     Yes Historical Provider, MD  Omega-3 Fatty Acids (FISH OIL) 1200 MG CAPS Take 2 capsules by mouth daily.   Yes Historical Provider, MD  omeprazole (PRILOSEC) 20 MG capsule Take 20 mg by mouth daily.     Yes Historical Provider, MD  perphenazine-amitriptyline (ETRAFON/TRIAVIL) 2-25 MG TABS Take 1 tablet by mouth daily.     Yes Historical Provider, MD  propranolol (INDERAL) 60 MG tablet Take 60 mg by mouth 2 (two) times daily.   Yes Historical Provider, MD  rosuvastatin (CRESTOR) 20 MG tablet Take 20 mg by mouth daily.   Yes Historical Provider, MD  vitamin E (VITAMIN E) 400 UNIT capsule Take 400 Units by mouth daily.     Yes Historical Provider, MD    FAMILY HISTORY: Family History  Problem Relation Age of Onset  . Diabetes type II Father     SOCIAL HISTORY:  reports that she has never smoked. She does not have any smokeless tobacco history on file. She reports that she does not drink alcohol or use illicit drugs.  REVIEW OF SYSTEMS:  Constitutional:   No  weight loss, night sweats,  Fevers, chills, fatigue.  HEENT:    No  Difficulty swallowing,Tooth/dental problems,Sore throat,  No sneezing, itching, ear ache, nasal congestion, post nasal drip,   Cardio-vascular: No chest pain,  Orthopnea, PND, swelling in lower extremities, anasarca,  dizziness, palpitations  GI:  No heartburn, indigestion, abdominal pain,   loss of appetite  Resp: No shortness of breath with exertion or at rest.  No excess mucus, no productive cough, No non-productive cough,  No coughing up of blood.No change in color of mucus.No wheezing.No chest wall deformity  Skin:  no rash or lesions.  GU:  no dysuria, change in color of urine, no urgency or frequency.  No flank pain.  Musculoskeletal: No joint pain or swelling.  No decreased range of motion.  No back pain.  Psych: No change in mood or  affect. No depression or anxiety.  No memory loss.   PHYSICAL EXAM: Blood pressure 157/56, pulse 97, temperature 98.5 F (36.9 C), temperature source Oral, resp. rate 9, SpO2 100.00%.  General appearance :Awake, alert, not in any distress. Speech Clear. Not toxic Looking HEENT: Atraumatic and Normocephalic, pupils equally reactive to light and accomodation Neck: supple, no JVD. No cervical lymphadenopathy.  Chest:Good air entry bilaterally, no added sounds  CVS: S1 S2 regular, no murmurs.  Abdomen: Bowel sounds present, Non tender and not distended with no gaurding, rigidity or rebound. Extremities: B/L Lower Ext shows no edema, both legs are warm to touch Neurology: Awake alert, and oriented X 3, CN II-XII intact, Non focal Skin:No Rash Wounds:N/A  LABS ON ADMISSION:   Recent Labs  09/12/12 0452 09/12/12 0520  NA 129* 127*  K  3.2* 3.3*  CL 88* 90*  CO2 26  --   GLUCOSE 210* 214*  BUN 18 16  CREATININE 0.78 0.90  CALCIUM 10.2  --     Recent Labs  09/12/12 0452  AST 23  ALT 14  ALKPHOS 40  BILITOT 0.4  PROT 7.5  ALBUMIN 3.8    Recent Labs  09/12/12 0452  LIPASE 46    Recent Labs  09/12/12 0452 09/12/12 0520  WBC 12.4*  --   HGB 12.0 12.2  HCT 31.6* 36.0  MCV 81.4  --   PLT 258  --    No results found for this basename: CKTOTAL, CKMB, CKMBINDEX, TROPONINI,  in the last 72 hours No results found for this basename: DDIMER,  in the last 72 hours No components found with this basename: POCBNP,    RADIOLOGIC STUDIES ON ADMISSION: Ct Abdomen Pelvis W Contrast  09/12/2012  *RADIOLOGY REPORT*  Clinical Data: Right upper quadrant abdominal pain.  Emesis.  CT ABDOMEN AND PELVIS WITH CONTRAST  Technique:  Multidetector CT imaging of the abdomen and pelvis was performed following the standard protocol during bolus administration of intravenous contrast.  Contrast: 157mL OMNIPAQUE IOHEXOL 300 MG/ML  SOLN  Comparison: Acute abdominal series 09/12/2012.  Findings:  The lung bases are clear without focal nodule, mass, or airspace disease.  The heart is mildly enlarged.  No significant pleural or pericardial effusion present.  A 7 mm hypodense lesion along the falciform ligament is likely represents a simple cyst.  The liver is otherwise unremarkable. The spleen is within normal limits.  The stomach is mildly distended without evidence for obstruction.  The duodenum and pancreas are unremarkable.  The common bile duct is within normal limits following cholecystectomy.  The adrenal glands are normal bilaterally.  The kidneys and ureters are within normal limits bilaterally.  A single sub centimeter cyst is present in both kidneys.  Atherosclerotic calcifications are present in the aorta and branch vessels without aneurysm.  The rectosigmoid colon is mostly collapsed.  The more proximal colon is within normal limits.  The appendix is not discretely visualized and may be surgically absent.  The patient is status post hysterectomy.  The ovaries are not definitively seen and may be surgically absent as well.  Urinary bladder is mildly distended. No obstructing lesion is evident.  The patient is status post ventral hernia repair.  There is a loop of small bowel extending into recurrent hernia along the inferior margin of the repair.  There is no evidence for obstruction.  The adjacent small bowel is within normal limits.  Leftward curvature of the lumbar spine is centered at L3.  There is rightward curvature in the lower thoracic spine.  Multilevel endplate changes are noted.  Multilevel facet disease is present as well.  Right foraminal stenosis is present at L1-2 and L2-3.  Left- sided disease is most evident at L4-5 and L5-S1.  IMPRESSION:  1.  Recurrent ventral hernia along the inferior aspect of a previous mesh repair contains at least one loop of small bowel without evidence for obstruction. 2.  No other acute or focal abnormality to explain the patient's symptoms. 3.   Atherosclerosis.  4.  Status post cholecystectomy. 5.  Moderate lumbar spondylosis and scoliosis as described.   Original Report Authenticated By: San Morelle, M.D.    Dg Abd Acute W/chest  09/12/2012  *RADIOLOGY REPORT*  Clinical Data: Abdominal pain, emesis  ACUTE ABDOMEN SERIES (ABDOMEN 2 VIEW & CHEST 1 VIEW)  Comparison:  01/23/2012  Findings: Heart size upper normal to mildly enlarged.  Mild left lung base/lingular opacity.  No free intraperitoneal air.  Relative paucity of small bowel gas, therefore cannot exclude obstruction.  Evidence of prior hernia repair with mesh coils noted over the right pelvis.  Surgical clips right upper quadrant suggest prior cholecystectomy.  Multilevel degenerative change in mild leftward curvature of the lumbar spine.  IMPRESSION: Nonspecific bowel gas pattern with relative paucity of bowel gas, can be seen in the setting of decompressed or fluid filled loops.  Mild left lung base/lingular opacity; atelectasis versus infiltrate.   Original Report Authenticated By: Carlos Levering, M.D.     ASSESSMENT AND PLAN: Present on Admission:  . Vomiting - Sudden onset of vomiting since yesterday-suspect viral syndrome  - Would admit for 24-hour observation, provide IV fluids and IV antiemetics, keep n.p.o. for now and slowly started on clear liquids  - CT scan of the abdomen shows a ventral hernia with a loop of bowel-however there is no obstruction. Lipase is within normal limits as well  - Will closely monitor and adjust therapeutic plans depending on clinical course.   . Dehydration - Secondary to above  - Hydrate with IV fluids, reassess for improvement in the next 12-24 hours   . Diabetes mellitus - Previously was on oral hypoglycemic agents-however her PCP as taken off these medications, will place on SSI during her admission here   . Headache - Chronic issue  - As patient has migraine headaches-continue with preadmission medications   . Reflux -  Continue PPI   . Hypertension - Resume all of her prior antihypertensive medications. If unable to tolerate oral medications-we'll place on IV medications.   . Hypokalemia  - Secondary to GI loss.  - Has been repleted in the emergency room, will recheck in a.m.  Marland Kitchen Hyponatremia - Suspect secondary to vomiting, hydrate with normal saline and recheck electrolytes in a.m.   Further plan will depend as patient's clinical course evolves and further radiologic and laboratory data become available. Patient will be monitored closely.   DVT Prophylaxis: Prophylactic Lovenox   Code Status: Full Code  Total time spent for admission equals 45 minutes.  [facility] Pager [phone]  If 7PM-7AM, please contact night-coverage [email or web address] Password [facility] 09/12/2012, 12:13 PM

## 2012-09-12 NOTE — ED Notes (Signed)
Internal medicine at bedside

## 2012-09-13 DIAGNOSIS — I10 Essential (primary) hypertension: Secondary | ICD-10-CM

## 2012-09-13 DIAGNOSIS — R109 Unspecified abdominal pain: Secondary | ICD-10-CM

## 2012-09-13 LAB — GLUCOSE, CAPILLARY: Glucose-Capillary: 110 mg/dL — ABNORMAL HIGH (ref 70–99)

## 2012-09-13 LAB — BASIC METABOLIC PANEL
BUN: 12 mg/dL (ref 6–23)
Creatinine, Ser: 0.86 mg/dL (ref 0.50–1.10)
GFR calc Af Amer: 69 mL/min — ABNORMAL LOW (ref >90)
GFR calc non Af Amer: 60 mL/min — ABNORMAL LOW (ref >90)
Glucose, Bld: 157 mg/dL — ABNORMAL HIGH (ref 70–99)
Potassium: 3.8 mEq/L (ref 3.5–5.1)

## 2012-09-13 LAB — CBC
HCT: 27 % — ABNORMAL LOW (ref 36.0–46.0)
Hemoglobin: 10 g/dL — ABNORMAL LOW (ref 12.0–15.0)
MCH: 30.9 pg (ref 26.0–34.0)
MCHC: 37 g/dL — ABNORMAL HIGH (ref 30.0–36.0)
RDW: 12.8 % (ref 11.5–15.5)

## 2012-09-13 MED ORDER — LABETALOL HCL 5 MG/ML IV SOLN
10.0000 mg | Freq: Four times a day (QID) | INTRAVENOUS | Status: DC
  Filled 2012-09-13 (×5): qty 4

## 2012-09-13 MED ORDER — HYDRALAZINE HCL 20 MG/ML IJ SOLN: 10.0000 mg | Freq: Four times a day (QID) | INTRAMUSCULAR | Status: DC | PRN

## 2012-09-13 MED ORDER — ONDANSETRON HCL 4 MG PO TABS: 4.0000 mg | ORAL_TABLET | Freq: Four times a day (QID) | ORAL | Status: DC | PRN

## 2012-09-13 NOTE — Progress Notes (Signed)
[facility] PROGRESS NOTE  Tamara Arnold [id number] DOB: [birth year] DOA: 09/12/2012 PCP: Stephens Shire, MD  Assessment/Plan  Vomiting, improving.  Was able to tolerate a clear liquid diet this morning.  - Advance to full liquids and then as tolerated  Abdominal pain, still present and a little worse after eating, but overall much better than last night.  May have some gastritis or ulcer -  Continue PPI  . Dehydration, improving.  Has voided several times since admission - Continue gentle hydration  . Diabetes mellitus FS are in the mid 100s range.   - Continue SSI  . Headache improving after fioricet last night - Chronic issue  - Continue prn fiorcet  . Reflux stable.  Continue PPI   . Hypertension  -  IV labetalol and hydralazine until able to tolerate pills -  Since able to tolerate liquids this morning, will likely be able to tolerate   . Hypokalemia resolved. - Secondary to GI loss.  - Has been repleted in the emergency room, will recheck in a.m.   Marland Kitchen Hyponatremia improving with hydration, asymptomatic  Diet:  Full liquids Access:  PIV IVF:  NS at 75 ml/h Proph:  lovenox  Code Status: Full code Family Communication: spoke with patient alone Disposition Plan: possibly home this afternoon if tolerating medications and liquids   Consultants:  none  Procedures:  None  Antibiotics:  none   HPI/Subjective:  Nausea and vomiting improving and was able to tolerate almost all of her liquid breakfast.  Would like something more substantial for lunch.  Passing gas and had a BM overnight that was small and formed.  HA resolved and she slept well.  Denies chest pain, SOB.  Voiding well    Objective: Filed Vitals:   09/12/12 2125 09/13/12 0012 09/13/12 0530 09/13/12 0931  BP: 181/53 121/51 157/55 157/56  Pulse: 103 76 72 86  Temp: 98.5 F (36.9 C) 97.5 F (36.4 C) 98.4 F (36.9 C) 98.3 F (36.8 C)  TempSrc: Oral   Oral  Resp: 17 16 17 16    Height:      Weight:   64.91 kg (143 lb 1.6 oz)   SpO2: 98% 99% 99% 100%    Intake/Output Summary (Last 24 hours) at 09/13/12 0937 Last data filed at 09/13/12 0932  Gross per 24 hour  Intake 2669.58 ml  Output      0 ml  Net 2669.58 ml   Filed Weights   09/12/12 1248 09/12/12 1722 09/13/12 0530  Weight: 62.9 kg (138 lb 10.7 oz) 64 kg (141 lb 1.5 oz) 64.91 kg (143 lb 1.6 oz)    Exam:   General:  CF, No acute distress  HEENT:  NCAT, MMM  Cardiovascular:  RRR, nl S1, S2 no mrg, 2+ pulses, warm extremities  Respiratory:  CTAB, no increased WOB  Abdomen:   Hyperactive BS, soft, gassy, mildly distended, minimal TTP in the epigastric region without rebound or guarding.  MSK:   Normal tone and bulk, no LEE  Neuro:  Grossly intact  Data Reviewed: Basic Metabolic Panel:  Recent Labs Lab 09/12/12 0452 09/12/12 0520 09/13/12 0550  NA 129* 127* 132*  K 3.2* 3.3* 3.8  CL 88* 90* 99  CO2 26  --  26  GLUCOSE 210* 214* 157*  BUN 18 16 12   CREATININE 0.78 0.90 0.86  CALCIUM 10.2  --  8.3*   Liver Function Tests:  Recent Labs Lab 09/12/12 0452  AST 23  ALT 14  ALKPHOS 40  BILITOT 0.4  PROT 7.5  ALBUMIN 3.8    Recent Labs Lab 09/12/12 0452  LIPASE 46   No results found for this basename: AMMONIA,  in the last 168 hours CBC:  Recent Labs Lab 09/12/12 0452 09/12/12 0520 09/13/12 0550  WBC 12.4*  --  6.7  HGB 12.0 12.2 10.0*  HCT 31.6* 36.0 27.0*  MCV 81.4  --  83.3  PLT 258  --  224   Cardiac Enzymes: No results found for this basename: CKTOTAL, CKMB, CKMBINDEX, TROPONINI,  in the last 168 hours BNP (last 3 results)  Recent Labs  12/21/11 0204 01/23/12 0643  PROBNP 2733.0* 2925.0*   CBG:  Recent Labs Lab 09/12/12 1245 09/12/12 1703 09/12/12 2205 09/13/12 0754  GLUCAP 160* 136* 164* 127*    No results found for this or any previous visit (from the past 240 hour(s)).   Studies: Ct Abdomen Pelvis W Contrast  09/12/2012  *RADIOLOGY  REPORT*  Clinical Data: Right upper quadrant abdominal pain.  Emesis.  CT ABDOMEN AND PELVIS WITH CONTRAST  Technique:  Multidetector CT imaging of the abdomen and pelvis was performed following the standard protocol during bolus administration of intravenous contrast.  Contrast: 119mL OMNIPAQUE IOHEXOL 300 MG/ML  SOLN  Comparison: Acute abdominal series 09/12/2012.  Findings: The lung bases are clear without focal nodule, mass, or airspace disease.  The heart is mildly enlarged.  No significant pleural or pericardial effusion present.  A 7 mm hypodense lesion along the falciform ligament is likely represents a simple cyst.  The liver is otherwise unremarkable. The spleen is within normal limits.  The stomach is mildly distended without evidence for obstruction.  The duodenum and pancreas are unremarkable.  The common bile duct is within normal limits following cholecystectomy.  The adrenal glands are normal bilaterally.  The kidneys and ureters are within normal limits bilaterally.  A single sub centimeter cyst is present in both kidneys.  Atherosclerotic calcifications are present in the aorta and branch vessels without aneurysm.  The rectosigmoid colon is mostly collapsed.  The more proximal colon is within normal limits.  The appendix is not discretely visualized and may be surgically absent.  The patient is status post hysterectomy.  The ovaries are not definitively seen and may be surgically absent as well.  Urinary bladder is mildly distended. No obstructing lesion is evident.  The patient is status post ventral hernia repair.  There is a loop of small bowel extending into recurrent hernia along the inferior margin of the repair.  There is no evidence for obstruction.  The adjacent small bowel is within normal limits.  Leftward curvature of the lumbar spine is centered at L3.  There is rightward curvature in the lower thoracic spine.  Multilevel endplate changes are noted.  Multilevel facet disease is present  as well.  Right foraminal stenosis is present at L1-2 and L2-3.  Left- sided disease is most evident at L4-5 and L5-S1.  IMPRESSION:  1.  Recurrent ventral hernia along the inferior aspect of a previous mesh repair contains at least one loop of small bowel without evidence for obstruction. 2.  No other acute or focal abnormality to explain the patient's symptoms. 3.  Atherosclerosis.  4.  Status post cholecystectomy. 5.  Moderate lumbar spondylosis and scoliosis as described.   Original Report Authenticated By: San Morelle, M.D.    Dg Abd Acute W/chest  09/12/2012  *RADIOLOGY REPORT*  Clinical Data: Abdominal pain, emesis  ACUTE ABDOMEN SERIES (ABDOMEN 2 VIEW &  CHEST 1 VIEW)  Comparison: 01/23/2012  Findings: Heart size upper normal to mildly enlarged.  Mild left lung base/lingular opacity.  No free intraperitoneal air.  Relative paucity of small bowel gas, therefore cannot exclude obstruction.  Evidence of prior hernia repair with mesh coils noted over the right pelvis.  Surgical clips right upper quadrant suggest prior cholecystectomy.  Multilevel degenerative change in mild leftward curvature of the lumbar spine.  IMPRESSION: Nonspecific bowel gas pattern with relative paucity of bowel gas, can be seen in the setting of decompressed or fluid filled loops.  Mild left lung base/lingular opacity; atelectasis versus infiltrate.   Original Report Authenticated By: Carlos Levering, M.D.     Scheduled Meds: . perphenazine  2 mg Oral Daily   And  . amitriptyline  25 mg Oral Daily  . amitriptyline  50 mg Oral QHS  . amLODipine  5 mg Oral Daily  . aspirin  81 mg Oral Daily  . atorvastatin  40 mg Oral q1800  . calcium-vitamin D  1 tablet Oral Daily  . clonazePAM  1 mg Oral BID  . enoxaparin (LOVENOX) injection  40 mg Subcutaneous Daily  . fenofibrate  160 mg Oral Daily  . ferrous sulfate  325 mg Oral Q breakfast  . folic acid  1 mg Oral BID  . hydrALAZINE  50 mg Oral TID  . insulin aspart  0-9  Units Subcutaneous TID WC  . labetalol  10 mg Intravenous Q6H  . lisinopril  40 mg Oral Daily  . multivitamin with minerals  1 tablet Oral Daily  . pantoprazole (PROTONIX) IV  40 mg Intravenous Q24H  . perphenazine-amitriptyline  1 tablet Oral Daily  . propranolol  60 mg Oral BID   Continuous Infusions: . sodium chloride 125 mL/hr at 09/13/12 0431    Principal Problem:   Vomiting Active Problems:   Headache   Diabetes mellitus   H/O major depression   Hypokalemia   Hypertension   Reflux   Dehydration   Hyponatremia    Time spent: 30 min    Caige Almeda, [facility]  [facility] Pager [phone]. If 7PM-7AM, please contact night-coverage at [email or web address], password [facility] 09/13/2012, 9:37 AM  LOS: 1 day

## 2012-09-13 NOTE — Progress Notes (Signed)
Nutrition Brief Note  Patient identified on the Malnutrition Screening Tool (MST) Report  Body mass index is 26.17 kg/(m^2). Patient meets criteria for overweight based on current BMI.   Current diet order is Full liquid, patient is consuming approximately 100% of meals at this time. Labs and medications reviewed.   Pt reports she lost <5 lbs over the past several months due to healthy changes in diet.  No nutrition-related concerns. No nutrition interventions warranted at this time. If nutrition issues arise, please consult RD.   Brynda Greathouse, MS RD LDN Clinical Inpatient Dietitian Pager: [phone] Weekend/After hours pager: [phone]

## 2012-09-13 NOTE — Discharge Summary (Signed)
Physician Discharge Summary  Tamara Arnold [id number] DOB: Feb 05, 1926 DOA: 09/12/2012  PCP: Stephens Shire, MD  Admit date: 09/12/2012 Discharge date: 09/13/2012  Recommendations for Outpatient Follow-up:  1. Follow up with primary care doctor in 1 week for repeat electrolytes and to restart HCTZ if necessary.   Discharge Diagnoses:  Principal Problem:   Vomiting Active Problems:   Headache   Diabetes mellitus   H/O major depression   Hypokalemia   Hypertension   Reflux   Dehydration   Hyponatremia   Discharge Condition: stable, improved  Diet recommendation: healthy heart, diabetic  Wt Readings from Last 3 Encounters:  09/13/12 64.91 kg (143 lb 1.6 oz)  01/25/12 65.318 kg (144 lb)  12/21/11 63.2 kg (139 lb 5.3 oz)    History of present illness:   Tamara Arnold is a 79 y.o. female with a Past Medical History of chronic migraine headaches, hypertension, diabetes who presents today with the above noted complaint. The patient, she was in his usual state of health, she noted that her last evening she started feeling nauseous and then proceeded to have numerous episodes of vomiting. Vomitus is nonbilious and nonbloody. She claims that she did not have diarrhea-but did have some frequent stools today. She claims to have mild upper abdominal discomfort. There is no history of fever. She claims, that none of her family members or friends whom she was with yesterday had similar symptoms. She was evaluated in the emergency room, given supportive care and numerous doses of anti-emetics, however she continues to feel nauseous, I was subsequently asked to admit this patient for further evaluation and treatment.  -Per patient, her last vomiting episode was just before she got to the emergency room. Since then she has been persistently nauseous and is not able to take any liquids at all.  She denies any chest pain or shortness of breath. She has chronic daily headaches that occurred at  baseline.  Hospital Course:   Vomiting, improving. Was able to tolerate a clear liquid diet this morning and full liquid lunch without nausea or vomiting.  Feeling better.    Abdominal pain, still present and a little worse after eating, but overall much better than last night. May have some gastritis or ulcer.  Continue PPI   . Dehydration, resolved with IVF. Has voided several times since admission and electrolytes are improved.    . Diabetes mellitus FS are in the mid 100s range.  SSI while inpatient, but may resume diabetic diet at home as before.    Marland Kitchen Headache improved after fioricet and toradol.  Chronic issue.    Marland Kitchen Reflux stable. Continue PPI   . Hypertension.  Had elevated BPs due to nausea and inability to tolerate pills, however, was able to tolerate medications this morning and anticipate BP will return to baseline with resolution of acute illness.  Her HCTZ was held due to electrolyte disturbance and may be restarted as an outpatient in 1-2 weeks depending on results of repeat BMP and BP.    Marland Kitchen Hypokalemia resolved with supplementation and was likely due to GI losses.    . Hyponatremia improved with hydration, asymptomatic.  Repeat electrolytes in a week.     Consultants:  none Procedures:  None Antibiotics:  none    Discharge Exam: Filed Vitals:   09/13/12 1332  BP: 156/54  Pulse: 74  Temp: 98.3 F (36.8 C)  Resp: 16   Filed Vitals:   09/13/12 0012 09/13/12 0530 09/13/12 0931 09/13/12 1332  BP: 121/51 157/55 157/56 156/54  Pulse: 76 72 86 74  Temp: 97.5 F (36.4 C) 98.4 F (36.9 C) 98.3 F (36.8 C) 98.3 F (36.8 C)  TempSrc:   Oral Oral  Resp: 16 17 16 16   Height:      Weight:  64.91 kg (143 lb 1.6 oz)    SpO2: 99% 99% 100% 100%    General: CF, No acute distress  HEENT: NCAT, MMM  Cardiovascular: RRR, nl S1, S2 no mrg, 2+ pulses, warm extremities  Respiratory: CTAB, no increased WOB  Abdomen: Hyperactive BS, soft, gassy, mildly distended,  minimal TTP in the epigastric region without rebound or guarding.  MSK: Normal tone and bulk, no LEE  Neuro: Grossly intact   Discharge Instructions      Discharge Orders   Future Orders Complete By Expires     Call MD for:  difficulty breathing, headache or visual disturbances  As directed     Call MD for:  extreme fatigue  As directed     Call MD for:  hives  As directed     Call MD for:  persistant dizziness or light-headedness  As directed     Call MD for:  persistant nausea and vomiting  As directed     Call MD for:  severe uncontrolled pain  As directed     Call MD for:  temperature >100.4  As directed     Diet Carb Modified  As directed     Discharge instructions  As directed     Comments:      You were hospitalized with nausea, vomiting, dehydration, and salt imbalance.  You were given IV fluids and nausea medications and you improved.  Because your potassium and sodium were low, please stop your HCTZ for now and follow up with your primary care doctor in 1 week to recheck your bloodwork and your blood pressure.  They can restart the HCTZ at that time if safe and necessary.  Please stay hydrated.    Increase activity slowly  As directed         Medication List    STOP taking these medications       hydrochlorothiazide 25 MG tablet  Commonly known as:  HYDRODIURIL      TAKE these medications       amitriptyline 25 MG tablet  Commonly known as:  ELAVIL  Take 50 mg by mouth at bedtime.     amLODipine 5 MG tablet  Commonly known as:  NORVASC  Take 5 mg by mouth 2 (two) times daily.     ascorbic acid 500 MG tablet  Commonly known as:  VITAMIN C  Take 500 mg by mouth daily.     aspirin 81 MG chewable tablet  Chew 81 mg by mouth daily.     butalbital-acetaminophen-caffeine 50-325-40 MG per tablet  Commonly known as:  FIORICET, ESGIC  Take 0.5-1 tablets by mouth 2 (two) times daily as needed for headache.     butorphanol 10 MG/ML nasal spray  Commonly known as:   STADOL  Place 1 spray into the nose every 4 (four) hours as needed (headache).     calcium-vitamin D 500-200 MG-UNIT per tablet  Take 1 tablet by mouth 2 (two) times daily with a meal.     clonazePAM 1 MG tablet  Commonly known as:  KLONOPIN  Take 1 mg by mouth 2 (two) times daily. For anxiety     fenofibrate 160 MG tablet  Take 160  mg by mouth daily.     ferrous sulfate 325 (65 FE) MG tablet  Take 325 mg by mouth daily with breakfast.     Fish Oil 1200 MG Caps  Take 2 capsules by mouth daily.     folic acid 1 MG tablet  Commonly known as:  FOLVITE  Take 1 mg by mouth 2 (two) times daily.     hydrALAZINE 50 MG tablet  Commonly known as:  APRESOLINE  Take 50 mg by mouth 3 (three) times daily.     lisinopril 40 MG tablet  Commonly known as:  PRINIVIL,ZESTRIL  Take 1 tablet (40 mg total) by mouth daily.     multivitamins ther. w/minerals Tabs  Take 1 tablet by mouth daily.     beta carotene w/minerals tablet  Take 1 tablet by mouth daily.     omeprazole 20 MG capsule  Commonly known as:  PRILOSEC  Take 20 mg by mouth daily.     ondansetron 4 MG tablet  Commonly known as:  ZOFRAN  Take 1 tablet (4 mg total) by mouth every 6 (six) hours as needed for nausea.     perphenazine-amitriptyline 2-25 MG Tabs  Commonly known as:  ETRAFON/TRIAVIL  Take 1 tablet by mouth daily.     propranolol 60 MG tablet  Commonly known as:  INDERAL  Take 60 mg by mouth 2 (two) times daily.     rosuvastatin 20 MG tablet  Commonly known as:  CRESTOR  Take 20 mg by mouth daily.     vitamin E 400 UNIT capsule  Generic drug:  vitamin E  Take 400 Units by mouth daily.       Follow-up Information   Follow up with BURNETT,BRENT A, MD. Schedule an appointment as soon as possible for a visit in 1 week. (repeat blood work)    Sport and exercise psychologist information:   [address] [place] [id number] [phone]       The results of significant diagnostics from this hospitalization (including  imaging, microbiology, ancillary and laboratory) are listed below for reference.    Significant Diagnostic Studies: Ct Abdomen Pelvis W Contrast  09/12/2012  *RADIOLOGY REPORT*  Clinical Data: Right upper quadrant abdominal pain.  Emesis.  CT ABDOMEN AND PELVIS WITH CONTRAST  Technique:  Multidetector CT imaging of the abdomen and pelvis was performed following the standard protocol during bolus administration of intravenous contrast.  Contrast: 138mL OMNIPAQUE IOHEXOL 300 MG/ML  SOLN  Comparison: Acute abdominal series 09/12/2012.  Findings: The lung bases are clear without focal nodule, mass, or airspace disease.  The heart is mildly enlarged.  No significant pleural or pericardial effusion present.  A 7 mm hypodense lesion along the falciform ligament is likely represents a simple cyst.  The liver is otherwise unremarkable. The spleen is within normal limits.  The stomach is mildly distended without evidence for obstruction.  The duodenum and pancreas are unremarkable.  The common bile duct is within normal limits following cholecystectomy.  The adrenal glands are normal bilaterally.  The kidneys and ureters are within normal limits bilaterally.  A single sub centimeter cyst is present in both kidneys.  Atherosclerotic calcifications are present in the aorta and branch vessels without aneurysm.  The rectosigmoid colon is mostly collapsed.  The more proximal colon is within normal limits.  The appendix is not discretely visualized and may be surgically absent.  The patient is status post hysterectomy.  The ovaries are not definitively seen and may be surgically absent as well.  Urinary bladder is mildly distended. No obstructing lesion is evident.  The patient is status post ventral hernia repair.  There is a loop of small bowel extending into recurrent hernia along the inferior margin of the repair.  There is no evidence for obstruction.  The adjacent small bowel is within normal limits.  Leftward curvature  of the lumbar spine is centered at L3.  There is rightward curvature in the lower thoracic spine.  Multilevel endplate changes are noted.  Multilevel facet disease is present as well.  Right foraminal stenosis is present at L1-2 and L2-3.  Left- sided disease is most evident at L4-5 and L5-S1.  IMPRESSION:  1.  Recurrent ventral hernia along the inferior aspect of a previous mesh repair contains at least one loop of small bowel without evidence for obstruction. 2.  No other acute or focal abnormality to explain the patient's symptoms. 3.  Atherosclerosis.  4.  Status post cholecystectomy. 5.  Moderate lumbar spondylosis and scoliosis as described.   Original Report Authenticated By: San Morelle, M.D.    Dg Abd Acute W/chest  09/12/2012  *RADIOLOGY REPORT*  Clinical Data: Abdominal pain, emesis  ACUTE ABDOMEN SERIES (ABDOMEN 2 VIEW & CHEST 1 VIEW)  Comparison: 01/23/2012  Findings: Heart size upper normal to mildly enlarged.  Mild left lung base/lingular opacity.  No free intraperitoneal air.  Relative paucity of small bowel gas, therefore cannot exclude obstruction.  Evidence of prior hernia repair with mesh coils noted over the right pelvis.  Surgical clips right upper quadrant suggest prior cholecystectomy.  Multilevel degenerative change in mild leftward curvature of the lumbar spine.  IMPRESSION: Nonspecific bowel gas pattern with relative paucity of bowel gas, can be seen in the setting of decompressed or fluid filled loops.  Mild left lung base/lingular opacity; atelectasis versus infiltrate.   Original Report Authenticated By: Carlos Levering, M.D.     Microbiology: No results found for this or any previous visit (from the past 240 hour(s)).   Labs: Basic Metabolic Panel:  Recent Labs Lab 09/12/12 0452 09/12/12 0520 09/13/12 0550  NA 129* 127* 132*  K 3.2* 3.3* 3.8  CL 88* 90* 99  CO2 26  --  26  GLUCOSE 210* 214* 157*  BUN 18 16 12   CREATININE 0.78 0.90 0.86  CALCIUM 10.2  --   8.3*   Liver Function Tests:  Recent Labs Lab 09/12/12 0452  AST 23  ALT 14  ALKPHOS 40  BILITOT 0.4  PROT 7.5  ALBUMIN 3.8    Recent Labs Lab 09/12/12 0452  LIPASE 46   No results found for this basename: AMMONIA,  in the last 168 hours CBC:  Recent Labs Lab 09/12/12 0452 09/12/12 0520 09/13/12 0550  WBC 12.4*  --  6.7  HGB 12.0 12.2 10.0*  HCT 31.6* 36.0 27.0*  MCV 81.4  --  83.3  PLT 258  --  224   Cardiac Enzymes: No results found for this basename: CKTOTAL, CKMB, CKMBINDEX, TROPONINI,  in the last 168 hours BNP: BNP (last 3 results)  Recent Labs  12/21/11 0204 01/23/12 0643  PROBNP 2733.0* 2925.0*   CBG:  Recent Labs Lab 09/12/12 1245 09/12/12 1703 09/12/12 2205 09/13/12 0754 09/13/12 1201  GLUCAP 160* 136* 164* 127* 110*    Time coordinating discharge: 45 minutes  Signed:  Daysie Helf  [facility] 09/13/2012, 2:03 PM

## 2012-09-13 NOTE — Progress Notes (Signed)
Discharge patient, patient instruction given, no question verbalized.

## 2013-01-07 ENCOUNTER — Other Ambulatory Visit: Payer: Self-pay | Admitting: Family Medicine

## 2013-01-07 DIAGNOSIS — M545 Low back pain: Secondary | ICD-10-CM

## 2013-01-13 ENCOUNTER — Ambulatory Visit
Admission: RE | Admit: 2013-01-13 | Discharge: 2013-01-13 | Disposition: A | Discharge status: Home / Self Care | Payer: Self-pay | Source: Ambulatory Visit | Attending: Family Medicine | Admitting: Family Medicine

## 2013-01-13 DIAGNOSIS — M545 Low back pain: Secondary | ICD-10-CM

## 2013-02-19 DIAGNOSIS — Z9289 Personal history of other medical treatment: Secondary | ICD-10-CM

## 2013-02-19 HISTORY — DX: Personal history of other medical treatment: Z92.89

## 2013-03-10 HISTORY — PX: BACK SURGERY: SHX140

## 2013-03-20 ENCOUNTER — Emergency Department (HOSPITAL_COMMUNITY): Payer: Medicare Other

## 2013-03-20 ENCOUNTER — Encounter (HOSPITAL_COMMUNITY): Payer: Self-pay | Admitting: Emergency Medicine

## 2013-03-20 ENCOUNTER — Inpatient Hospital Stay (HOSPITAL_COMMUNITY)
Admission: EM | Admit: 2013-03-20 | Discharge: 2013-03-27 | DRG: 391 | Disposition: A | Discharge status: Skilled Nursing Facility | Payer: Medicare Other | Attending: Internal Medicine | Admitting: Internal Medicine

## 2013-03-20 DIAGNOSIS — E860 Dehydration: Secondary | ICD-10-CM

## 2013-03-20 DIAGNOSIS — R51 Headache: Secondary | ICD-10-CM

## 2013-03-20 DIAGNOSIS — N390 Urinary tract infection, site not specified: Secondary | ICD-10-CM

## 2013-03-20 DIAGNOSIS — Z7982 Long term (current) use of aspirin: Secondary | ICD-10-CM

## 2013-03-20 DIAGNOSIS — F411 Generalized anxiety disorder: Secondary | ICD-10-CM | POA: Diagnosis present

## 2013-03-20 DIAGNOSIS — K219 Gastro-esophageal reflux disease without esophagitis: Secondary | ICD-10-CM | POA: Diagnosis present

## 2013-03-20 DIAGNOSIS — E876 Hypokalemia: Secondary | ICD-10-CM | POA: Diagnosis present

## 2013-03-20 DIAGNOSIS — I10 Essential (primary) hypertension: Secondary | ICD-10-CM | POA: Diagnosis present

## 2013-03-20 DIAGNOSIS — Z85828 Personal history of other malignant neoplasm of skin: Secondary | ICD-10-CM

## 2013-03-20 DIAGNOSIS — I5031 Acute diastolic (congestive) heart failure: Secondary | ICD-10-CM | POA: Diagnosis not present

## 2013-03-20 DIAGNOSIS — K5900 Constipation, unspecified: Principal | ICD-10-CM | POA: Diagnosis present

## 2013-03-20 DIAGNOSIS — R0602 Shortness of breath: Secondary | ICD-10-CM

## 2013-03-20 DIAGNOSIS — E871 Hypo-osmolality and hyponatremia: Secondary | ICD-10-CM | POA: Diagnosis present

## 2013-03-20 DIAGNOSIS — G8929 Other chronic pain: Secondary | ICD-10-CM | POA: Diagnosis present

## 2013-03-20 DIAGNOSIS — E119 Type 2 diabetes mellitus without complications: Secondary | ICD-10-CM

## 2013-03-20 DIAGNOSIS — I509 Heart failure, unspecified: Secondary | ICD-10-CM | POA: Diagnosis not present

## 2013-03-20 DIAGNOSIS — E785 Hyperlipidemia, unspecified: Secondary | ICD-10-CM | POA: Diagnosis present

## 2013-03-20 DIAGNOSIS — M545 Low back pain, unspecified: Secondary | ICD-10-CM | POA: Diagnosis present

## 2013-03-20 DIAGNOSIS — Z794 Long term (current) use of insulin: Secondary | ICD-10-CM

## 2013-03-20 DIAGNOSIS — Z79899 Other long term (current) drug therapy: Secondary | ICD-10-CM

## 2013-03-20 HISTORY — DX: Depression, unspecified: F32.A

## 2013-03-20 HISTORY — DX: Shortness of breath: R06.02

## 2013-03-20 HISTORY — DX: Major depressive disorder, single episode, unspecified: F32.9

## 2013-03-20 HISTORY — DX: Personal history of other medical treatment: Z92.89

## 2013-03-20 LAB — CBC WITH DIFFERENTIAL/PLATELET
Hemoglobin: 9.4 g/dL — ABNORMAL LOW (ref 12.0–15.0)
Lymphs Abs: 1.1 10*3/uL (ref 0.7–4.0)
MCH: 31.2 pg (ref 26.0–34.0)
Monocytes Relative: 13 % — ABNORMAL HIGH (ref 3–12)
Neutro Abs: 7.5 10*3/uL (ref 1.7–7.7)
Neutrophils Relative %: 76 % (ref 43–77)
RBC: 3.01 MIL/uL — ABNORMAL LOW (ref 3.87–5.11)

## 2013-03-20 LAB — URINE MICROSCOPIC-ADD ON

## 2013-03-20 LAB — COMPREHENSIVE METABOLIC PANEL
AST: 19 U/L (ref 0–37)
Albumin: 2.4 g/dL — ABNORMAL LOW (ref 3.5–5.2)
Alkaline Phosphatase: 46 U/L (ref 39–117)
BUN: 14 mg/dL (ref 6–23)
Potassium: 3.1 mEq/L — ABNORMAL LOW (ref 3.5–5.1)
Sodium: 120 mEq/L — ABNORMAL LOW (ref 135–145)
Total Protein: 5.7 g/dL — ABNORMAL LOW (ref 6.0–8.3)

## 2013-03-20 LAB — URINALYSIS, ROUTINE W REFLEX MICROSCOPIC
Bilirubin Urine: NEGATIVE
Glucose, UA: NEGATIVE mg/dL
Nitrite: NEGATIVE
Specific Gravity, Urine: 1.009 (ref 1.005–1.030)
pH: 6 (ref 5.0–8.0)

## 2013-03-20 LAB — BASIC METABOLIC PANEL
BUN: 12 mg/dL (ref 6–23)
Calcium: 8.9 mg/dL (ref 8.4–10.5)
Chloride: 85 mEq/L — ABNORMAL LOW (ref 96–112)
GFR calc Af Amer: >90 mL/min (ref >90)
GFR calc non Af Amer: 81 mL/min — ABNORMAL LOW (ref >90)
Potassium: 2.8 mEq/L — ABNORMAL LOW (ref 3.5–5.1)

## 2013-03-20 LAB — LIPASE, BLOOD: Lipase: 121 U/L — ABNORMAL HIGH (ref 11–59)

## 2013-03-20 MED ORDER — ASPIRIN 81 MG PO CHEW
81.0000 mg | CHEWABLE_TABLET | Freq: Every day | ORAL | Status: DC
  Administered 2013-03-21 – 2013-03-27 (×7): 81 mg via ORAL
  Filled 2013-03-20 (×7): qty 1

## 2013-03-20 MED ORDER — ACETAMINOPHEN 650 MG RE SUPP: 650.0000 mg | Freq: Four times a day (QID) | RECTAL | Status: DC | PRN

## 2013-03-20 MED ORDER — HEPARIN SODIUM (PORCINE) 5000 UNIT/ML IJ SOLN
5000.0000 [IU] | Freq: Three times a day (TID) | INTRAMUSCULAR | Status: DC
  Administered 2013-03-20 – 2013-03-27 (×19): 5000 [IU] via SUBCUTANEOUS
  Filled 2013-03-20 (×23): qty 1

## 2013-03-20 MED ORDER — SODIUM CHLORIDE 0.9 % IV SOLN
1000.0000 mL | INTRAVENOUS | Status: DC
  Administered 2013-03-20 – 2013-03-22 (×2): 1000 mL via INTRAVENOUS

## 2013-03-20 MED ORDER — METOCLOPRAMIDE HCL 5 MG/ML IJ SOLN
5.0000 mg | Freq: Three times a day (TID) | INTRAMUSCULAR | Status: DC
  Administered 2013-03-21 – 2013-03-24 (×11): 5 mg via INTRAVENOUS
  Filled 2013-03-20 (×13): qty 1
  Filled 2013-03-20: qty 2

## 2013-03-20 MED ORDER — DEXTROSE 5 % IV SOLN
1.0000 g | INTRAVENOUS | Status: DC
  Administered 2013-03-21 – 2013-03-26 (×7): 1 g via INTRAVENOUS
  Filled 2013-03-20 (×9): qty 10

## 2013-03-20 MED ORDER — HYDRALAZINE HCL 50 MG PO TABS
50.0000 mg | ORAL_TABLET | Freq: Two times a day (BID) | ORAL | Status: DC
  Administered 2013-03-20 – 2013-03-23 (×6): 50 mg via ORAL
  Filled 2013-03-20 (×7): qty 1

## 2013-03-20 MED ORDER — LISINOPRIL 40 MG PO TABS
40.0000 mg | ORAL_TABLET | Freq: Every day | ORAL | Status: DC
  Administered 2013-03-21 – 2013-03-23 (×3): 40 mg via ORAL
  Filled 2013-03-20 (×3): qty 1

## 2013-03-20 MED ORDER — MORPHINE SULFATE 2 MG/ML IJ SOLN
2.0000 mg | INTRAMUSCULAR | Status: AC | PRN
  Administered 2013-03-20 (×2): 2 mg via INTRAVENOUS
  Filled 2013-03-20 (×2): qty 1

## 2013-03-20 MED ORDER — IOHEXOL 300 MG/ML  SOLN
25.0000 mL | INTRAMUSCULAR | Status: AC
  Administered 2013-03-20: 25 mL via ORAL

## 2013-03-20 MED ORDER — ACETAMINOPHEN 325 MG PO TABS
650.0000 mg | ORAL_TABLET | Freq: Four times a day (QID) | ORAL | Status: DC | PRN
  Administered 2013-03-21 – 2013-03-23 (×4): 650 mg via ORAL
  Filled 2013-03-20 (×4): qty 2

## 2013-03-20 MED ORDER — INSULIN ASPART 100 UNIT/ML ~~LOC~~ SOLN
0.0000 [IU] | Freq: Three times a day (TID) | SUBCUTANEOUS | Status: DC
  Administered 2013-03-21: 2 [IU] via SUBCUTANEOUS
  Administered 2013-03-21: 5 [IU] via SUBCUTANEOUS
  Administered 2013-03-22: 4 [IU] via SUBCUTANEOUS
  Administered 2013-03-22 – 2013-03-23 (×5): 3 [IU] via SUBCUTANEOUS
  Administered 2013-03-24: 8 [IU] via SUBCUTANEOUS
  Administered 2013-03-24: 3 [IU] via SUBCUTANEOUS
  Administered 2013-03-24: 2 [IU] via SUBCUTANEOUS
  Administered 2013-03-25 (×2): 5 [IU] via SUBCUTANEOUS
  Administered 2013-03-26: 2 [IU] via SUBCUTANEOUS
  Administered 2013-03-26: 8 [IU] via SUBCUTANEOUS
  Administered 2013-03-26: 3 [IU] via SUBCUTANEOUS
  Administered 2013-03-27: 5 [IU] via SUBCUTANEOUS
  Administered 2013-03-27: 2 [IU] via SUBCUTANEOUS

## 2013-03-20 MED ORDER — FOLIC ACID 1 MG PO TABS
1.0000 mg | ORAL_TABLET | Freq: Two times a day (BID) | ORAL | Status: DC
  Administered 2013-03-20 – 2013-03-27 (×14): 1 mg via ORAL
  Filled 2013-03-20 (×15): qty 1

## 2013-03-20 MED ORDER — POTASSIUM CHLORIDE 10 MEQ/100ML IV SOLN
10.0000 meq | INTRAVENOUS | Status: AC
  Administered 2013-03-20 (×3): 10 meq via INTRAVENOUS
  Filled 2013-03-20 (×4): qty 100

## 2013-03-20 MED ORDER — CLONAZEPAM 1 MG PO TABS
1.0000 mg | ORAL_TABLET | Freq: Two times a day (BID) | ORAL | Status: DC | PRN
  Administered 2013-03-21 – 2013-03-27 (×7): 1 mg via ORAL
  Filled 2013-03-20 (×8): qty 1

## 2013-03-20 MED ORDER — PROMETHAZINE HCL 25 MG PO TABS: 12.5000 mg | ORAL_TABLET | Freq: Four times a day (QID) | ORAL | Status: DC | PRN

## 2013-03-20 MED ORDER — FLUTICASONE PROPIONATE 50 MCG/ACT NA SUSP
2.0000 | Freq: Every day | NASAL | Status: DC
  Administered 2013-03-22 – 2013-03-27 (×6): 2 via NASAL
  Filled 2013-03-20 (×2): qty 16

## 2013-03-20 MED ORDER — CEPHALEXIN 250 MG PO CAPS
500.0000 mg | ORAL_CAPSULE | Freq: Once | ORAL | Status: AC
  Administered 2013-03-20: 500 mg via ORAL
  Filled 2013-03-20 (×2): qty 2

## 2013-03-20 MED ORDER — ATORVASTATIN CALCIUM 40 MG PO TABS
40.0000 mg | ORAL_TABLET | Freq: Every day | ORAL | Status: DC
  Administered 2013-03-21 – 2013-03-26 (×6): 40 mg via ORAL
  Filled 2013-03-20 (×7): qty 1

## 2013-03-20 MED ORDER — AMLODIPINE BESYLATE 5 MG PO TABS
5.0000 mg | ORAL_TABLET | Freq: Two times a day (BID) | ORAL | Status: DC
  Administered 2013-03-20 – 2013-03-23 (×6): 5 mg via ORAL
  Filled 2013-03-20 (×7): qty 1

## 2013-03-20 MED ORDER — ONDANSETRON HCL 4 MG/2ML IJ SOLN
4.0000 mg | Freq: Once | INTRAMUSCULAR | Status: AC
  Administered 2013-03-20: 4 mg via INTRAVENOUS
  Filled 2013-03-20: qty 2

## 2013-03-20 MED ORDER — PROPRANOLOL HCL 60 MG PO TABS
60.0000 mg | ORAL_TABLET | Freq: Two times a day (BID) | ORAL | Status: DC
  Administered 2013-03-20 – 2013-03-27 (×14): 60 mg via ORAL
  Filled 2013-03-20 (×15): qty 1

## 2013-03-20 MED ORDER — IOHEXOL 300 MG/ML  SOLN
100.0000 mL | Freq: Once | INTRAMUSCULAR | Status: AC | PRN
  Administered 2013-03-20: 100 mL via INTRAVENOUS

## 2013-03-20 MED ORDER — BISACODYL 10 MG RE SUPP
10.0000 mg | Freq: Every day | RECTAL | Status: DC | PRN
  Administered 2013-03-23: 10 mg via RECTAL
  Filled 2013-03-20: qty 1

## 2013-03-20 MED ORDER — POLYETHYLENE GLYCOL 3350 17 G PO PACK
17.0000 g | PACK | Freq: Every day | ORAL | Status: DC
  Administered 2013-03-20 – 2013-03-27 (×8): 17 g via ORAL
  Filled 2013-03-20 (×8): qty 1

## 2013-03-20 MED ORDER — PANTOPRAZOLE SODIUM 40 MG IV SOLR
40.0000 mg | INTRAVENOUS | Status: DC
  Administered 2013-03-20 – 2013-03-23 (×4): 40 mg via INTRAVENOUS
  Filled 2013-03-20 (×5): qty 40

## 2013-03-20 MED ORDER — INSULIN ASPART 100 UNIT/ML ~~LOC~~ SOLN: 0.0000 [IU] | Freq: Every day | SUBCUTANEOUS | Status: DC

## 2013-03-20 MED ORDER — FENOFIBRATE 160 MG PO TABS
160.0000 mg | ORAL_TABLET | Freq: Every day | ORAL | Status: DC
  Administered 2013-03-21 – 2013-03-27 (×7): 160 mg via ORAL
  Filled 2013-03-20 (×7): qty 1

## 2013-03-20 NOTE — ED Notes (Signed)
Pt states that the distention in her abdomen makes it difficult to breathe sometimes  .

## 2013-03-20 NOTE — ED Notes (Addendum)
Pt is insulin dependent diabetic.  CBG 198. Has not been able to eat or drink. N/v x2 weeks. New onset Abd swelling.  Ambulatory with walker. Had surgery lami 03/10/13 at [facility]. Nausea en route.  BP 170/70 and has HA.  BM x2 today - normal for her.  Voiding normally.

## 2013-03-20 NOTE — ED Provider Notes (Signed)
CSN: [id number]     Arrival date & time 03/20/13  1225 History   First MD Initiated Contact with Patient 03/20/13 1233     Chief Complaint  Patient presents with  . Abdominal Pain  . Nausea  . Emesis    HPI Pt had back surgery on the 20th.  She went to rehab facility after that.  Ever since then she has been having trouble with nausea and vomiting.  She feels like her abdomen is swollen.  She has not had a good bowel movement since then.  She vomits all the time and cant keep anything down.  She has not seen her surgeon since her discharge.  She has been getting treatment at [facility] but is not feeling nay better. Pt had 2 bowel movements today.  She has been able to void normally.  Pt also concerned because her blood sugars and blood pressure has been high. Past Medical History  Diagnosis Date  . Hypertension   . Hyperlipidemia   . Skin cancer     "on my face alot; have one on my left leg right now; had one cut off left leg before; (09/12/2012)  . GERD (gastroesophageal reflux disease)   . Type II diabetes mellitus   . Anemia   . Migraine     "not migraines/neurologist" (09/12/2012)  . Daily headache     "off and on for 36 yr" (09/12/2012)  . Arthritis     "starting in my knees; not bad" (09/12/2012)  . Chronic lower back pain   . Anxiety    Past Surgical History  Procedure Laterality Date  . Cystectomy      "top of my head; removed @ [facility]; it was benign" (09/12/2012)  . Cataract extraction w/ intraocular lens  implant, bilateral Bilateral   . Appendectomy  1934  . Cholecystectomy  1970's?  . Hernia repair    . Umbilical hernia repair    . Abdominal hysterectomy    . Skin cancer excision      LLE   Family History  Problem Relation Age of Onset  . Diabetes type II Father    History  Substance Use Topics  . Smoking status: Never Smoker   . Smokeless tobacco: Never Used  . Alcohol Use: No   OB History   Grav Para Term Preterm Abortions TAB SAB Ect Mult Living                  Review of Systems  Neurological: Positive for headaches.  All other systems reviewed and are negative.    Allergies  Gabapentin; Prednisone; Topiramate; and Wellbutrin  Home Medications   Current Outpatient Rx  Name  Route  Sig  Dispense  Refill  . ACETAMINOPHEN-BUTALBITAL 50-325 MG TABS   Oral   Take 1 tablet by mouth daily.         Marland Kitchen amitriptyline (ELAVIL) 25 MG tablet   Oral   Take 50 mg by mouth at bedtime.         Marland Kitchen amLODipine (NORVASC) 5 MG tablet   Oral   Take 5 mg by mouth 2 (two) times daily.         Marland Kitchen ascorbic acid (VITAMIN C) 500 MG tablet   Oral   Take 500 mg by mouth daily.           Marland Kitchen aspirin 81 MG chewable tablet   Oral   Chew 81 mg by mouth daily.           Marland Kitchen  beta carotene w/minerals (OCUVITE) tablet   Oral   Take 1 tablet by mouth daily.         . Calcium-Magnesium-Vitamin D (CALCIUM 500 PO)   Oral   Take 500 mg by mouth daily.         . clonazePAM (KLONOPIN) 1 MG tablet   Oral   Take 1 mg by mouth 2 (two) times daily as needed for anxiety. For anxiety         . fenofibrate 160 MG tablet   Oral   Take 160 mg by mouth daily.         . fluticasone (FLONASE) 50 MCG/ACT nasal spray   Nasal   Place 2 sprays into the nose daily.         . folic acid (FOLVITE) 1 MG tablet   Oral   Take 1 mg by mouth 2 (two) times daily.           . furosemide (LASIX) 20 MG tablet   Oral   Take 20 mg by mouth daily. For 3 days. Last dose 03/21/13         . hydrALAZINE (APRESOLINE) 50 MG tablet   Oral   Take 50 mg by mouth 2 (two) times daily.          . hydrochlorothiazide (HYDRODIURIL) 12.5 MG tablet   Oral   Take 12.5 mg by mouth daily.         . insulin regular (NOVOLIN R,HUMULIN R) 100 units/mL injection   Subcutaneous   Inject 2-10 Units into the skin 3 (three) times daily before meals.         Marland Kitchen lisinopril (PRINIVIL,ZESTRIL) 40 MG tablet   Oral   Take 40 mg by mouth daily.         . meloxicam  (MOBIC) 15 MG tablet   Oral   Take 15 mg by mouth daily.         . Multiple Vitamins-Minerals (MULTIVITAMINS THER. W/MINERALS) TABS   Oral   Take 1 tablet by mouth daily.           Marland Kitchen omeprazole (PRILOSEC) 20 MG capsule   Oral   Take 20 mg by mouth daily.           Marland Kitchen oxycodone (OXY-IR) 5 MG capsule   Oral   Take 5 mg by mouth every 3 (three) hours as needed for pain.         Marland Kitchen oxyCODONE-acetaminophen (PERCOCET/ROXICET) 5-325 MG per tablet   Oral   Take 1 tablet by mouth every 4 (four) hours as needed for pain.         Marland Kitchen perphenazine (TRILAFON) 4 MG tablet   Oral   Take 2 mg by mouth daily.         . polyethylene glycol (MIRALAX / GLYCOLAX) packet   Oral   Take 17 g by mouth daily.         . promethazine (PHENERGAN) 25 MG/ML injection   Intramuscular   Inject 25 mg into the muscle every 6 (six) hours as needed for nausea or vomiting.         . propranolol (INDERAL) 60 MG tablet   Oral   Take 60 mg by mouth 2 (two) times daily.         . rosuvastatin (CRESTOR) 20 MG tablet   Oral   Take 20 mg by mouth daily.         . sennosides-docusate sodium (SENOKOT-S) 8.6-50 MG  tablet   Oral   Take 2 tablets by mouth 2 (two) times daily as needed for constipation.         . tamsulosin (FLOMAX) 0.4 MG CAPS capsule   Oral   Take 0.4 mg by mouth daily. Last dose today         . vitamin E (VITAMIN E) 400 UNIT capsule   Oral   Take 400 Units by mouth daily.           . Omega-3 Fatty Acids (FISH OIL) 1200 MG CAPS   Oral   Take 2 capsules by mouth daily.         . ondansetron (ZOFRAN) 4 MG tablet   Oral   Take 1 tablet (4 mg total) by mouth every 6 (six) hours as needed for nausea.   20 tablet   0   . perphenazine-amitriptyline (ETRAFON/TRIAVIL) 2-25 MG TABS   Oral   Take 1 tablet by mouth daily.            BP 189/65  Pulse 64  Temp(Src) 97.4 F (36.3 C) (Oral)  Resp 13  SpO2 99% Physical Exam  Nursing note and vitals  reviewed. Constitutional: No distress.  HENT:  Head: Normocephalic and atraumatic.  Right Ear: External ear normal.  Left Ear: External ear normal.  Eyes: Conjunctivae are normal. Right eye exhibits no discharge. Left eye exhibits no discharge. No scleral icterus.  Neck: Neck supple. No tracheal deviation present.  Cardiovascular: Normal rate, regular rhythm and intact distal pulses.   Pulmonary/Chest: Effort normal and breath sounds normal. No stridor. No respiratory distress. She has no wheezes. She has no rales.  Abdominal: Soft. Bowel sounds are normal. She exhibits no distension. There is generalized tenderness. There is no rebound and no guarding.  Genitourinary: Rectal exam shows no mass and no tenderness.  Brown stool  Musculoskeletal: She exhibits no edema and no tenderness.  Neurological: She is alert. She has normal strength. No sensory deficit. Cranial nerve deficit:  no gross defecits noted. She exhibits normal muscle tone. She displays no seizure activity. Coordination normal.  Skin: Skin is warm and dry. No rash noted.  Psychiatric: She has a normal mood and affect.    ED Course  Procedures (including critical care time) Labs Review Labs Reviewed  COMPREHENSIVE METABOLIC PANEL - Abnormal; Notable for the following:    Sodium 120 (*)    Potassium 3.1 (*)    Chloride 81 (*)    Glucose, Bld 234 (*)    Total Protein 5.7 (*)    Albumin 2.4 (*)    GFR calc non Af Amer 76 (*)    GFR calc Af Amer 89 (*)    All other components within normal limits  LIPASE, BLOOD - Abnormal; Notable for the following:    Lipase 121 (*)    All other components within normal limits  URINALYSIS, ROUTINE W REFLEX MICROSCOPIC - Abnormal; Notable for the following:    APPearance CLOUDY (*)    Protein, ur 100 (*)    Leukocytes, UA MODERATE (*)    All other components within normal limits  CBC WITH DIFFERENTIAL - Abnormal; Notable for the following:    RBC 3.01 (*)    Hemoglobin 9.4 (*)     HCT 25.5 (*)    MCHC 36.9 (*)    Lymphocytes Relative 11 (*)    Monocytes Relative 13 (*)    Monocytes Absolute 1.3 (*)    All other components within normal limits  URINE MICROSCOPIC-ADD ON - Abnormal; Notable for the following:    Squamous Epithelial / LPF FEW (*)    Bacteria, UA FEW (*)    Casts HYALINE CASTS (*)    All other components within normal limits  URINE CULTURE   Imaging Review Ct Abdomen Pelvis W Contrast  03/20/2013   CLINICAL DATA:  Abdominal pain, nausea.  EXAM: CT ABDOMEN AND PELVIS WITH CONTRAST  TECHNIQUE: Multidetector CT imaging of the abdomen and pelvis was performed using the standard protocol following bolus administration of intravenous contrast.  CONTRAST:  15mL OMNIPAQUE IOHEXOL 300 MG/ML  SOLN  COMPARISON:  None.  FINDINGS: There are small bilateral pleural effusions. Compressive atelectasis in the lower lobes. Heart is borderline in size.  Prior cholecystectomy. Small hypodensity within the left hepatic lobe, stable, likely small cysts. Spleen, pancreas, adrenals and kidneys are unremarkable. Slight fullness of the right renal collecting system, but this is stable since prior study. No renal or ureteral stones. Mild physiologic distention of the urinary bladder. There is gas within the urinary bladder, presumably from recent catheterization.  Moderate stool throughout the colon. Small bowel is decompressed. No evidence of bowel obstruction. Aorta and iliac vessels are heavily calcified, non aneurysmal.  No acute bony abnormality. Postsurgical changes from prior posterior fusion in the lumbar spine. Degenerative changes.  IMPRESSION: No acute findings in the abdomen or pelvis. Moderate stool burden throughout the colon. Mild distention of the urinary bladder.  Small bilateral pleural effusions.   Electronically Signed   By: Rolm Baptise M.D.   On: 03/20/2013 16:36    EKG Interpretation   None      1656  Pt did urinate after the CT scan.   MDM   1.  Hyponatremia   2. UTI (urinary tract infection)    Pt is having persistent weakness and nausea.  Not taking po fluids well.  Her hyponatremia is new compared to labs.  Elevated lipase as well although ct does not show any abnormality.  Will admit for iv hydration and monitoring.    Kathalene Frames, MD 03/20/13 [phone]

## 2013-03-20 NOTE — H&P (Signed)
[facility] History and Physical  PIA Tamara Arnold DOB: June 17, 1925 DOA: 03/20/2013  Referring physician: er PCP: Stephens Shire, MD  Specialists:   Chief Complaint: fatigue and N/V  HPI: Tamara Arnold is a 79 y.o. female  Who had back surgery at [facility] on October 20th.  After d/c she went to [facility].  She said that she has had decreased appetite and N/v since d/c after surgery.  She feels like her abd is distended.  She has been having BMs but they have been firm.   She says her blood sugar and blood pressure have been high.   +nausea  In the ER, a CT scan of abd was done that showed some stool.  Lipase was mildly elevated but she only had minimal epigastric pain.  Her Na and K was found to be low- patient has been hospitalized in the past for similar episodes of low Na and nausea   Review of Systems: all systems reviewed, negative unless stated above    Past Medical History  Diagnosis Date  . Hypertension   . Hyperlipidemia   . Skin cancer     "on my face alot; have one on my left leg right now; had one cut off left leg before; (09/12/2012)  . GERD (gastroesophageal reflux disease)   . Type II diabetes mellitus   . Anemia   . Migraine     "not migraines/neurologist" (09/12/2012)  . Daily headache     "off and on for 36 yr" (09/12/2012)  . Arthritis     "starting in my knees; not bad" (09/12/2012)  . Chronic lower back pain   . Anxiety    Past Surgical History  Procedure Laterality Date  . Cystectomy      "top of my head; removed @ [facility]; it was benign" (09/12/2012)  . Cataract extraction w/ intraocular lens  implant, bilateral Bilateral   . Appendectomy  1934  . Cholecystectomy  1970's?  . Hernia repair    . Umbilical hernia repair    . Abdominal hysterectomy    . Skin cancer excision      LLE   Social History:  reports that she has never smoked. She has never used smokeless tobacco. She reports that she does not drink alcohol or use illicit  drugs.   Allergies  Allergen Reactions  . Gabapentin Other (See Comments)    headaches  . Prednisone Other (See Comments)    Headaches-all steroids  . Topiramate Other (See Comments)    Makes headaches worse  . Wellbutrin [Bupropion Hcl] Other (See Comments)    Caused headaches    Family History  Problem Relation Age of Onset  . Diabetes type II Father     Prior to Admission medications   Medication Sig Start Date End Date Taking? Authorizing Provider  ACETAMINOPHEN-BUTALBITAL 50-325 MG TABS Take 1 tablet by mouth daily.   Yes Historical Provider, MD  amitriptyline (ELAVIL) 25 MG tablet Take 50 mg by mouth at bedtime.   Yes Historical Provider, MD  amLODipine (NORVASC) 5 MG tablet Take 5 mg by mouth 2 (two) times daily.   Yes Historical Provider, MD  ascorbic acid (VITAMIN C) 500 MG tablet Take 500 mg by mouth daily.     Yes Historical Provider, MD  aspirin 81 MG chewable tablet Chew 81 mg by mouth daily.     Yes Historical Provider, MD  beta carotene w/minerals (OCUVITE) tablet Take 1 tablet by mouth daily.   Yes Historical Provider, MD  Calcium-Magnesium-Vitamin D (CALCIUM 500 PO) Take 500 mg by mouth daily.   Yes Historical Provider, MD  clonazePAM (KLONOPIN) 1 MG tablet Take 1 mg by mouth 2 (two) times daily as needed for anxiety. For anxiety   Yes Historical Provider, MD  fenofibrate 160 MG tablet Take 160 mg by mouth daily.   Yes Historical Provider, MD  fluticasone (FLONASE) 50 MCG/ACT nasal spray Place 2 sprays into the nose daily.   Yes Historical Provider, MD  folic acid (FOLVITE) 1 MG tablet Take 1 mg by mouth 2 (two) times daily.     Yes Historical Provider, MD  furosemide (LASIX) 20 MG tablet Take 20 mg by mouth daily. For 3 days. Last dose 03/21/13   Yes Historical Provider, MD  hydrALAZINE (APRESOLINE) 50 MG tablet Take 50 mg by mouth 2 (two) times daily.    Yes Historical Provider, MD  hydrochlorothiazide (HYDRODIURIL) 12.5 MG tablet Take 12.5 mg by mouth daily.    Yes Historical Provider, MD  insulin regular (NOVOLIN R,HUMULIN R) 100 units/mL injection Inject 2-10 Units into the skin 3 (three) times daily before meals.   Yes Historical Provider, MD  lisinopril (PRINIVIL,ZESTRIL) 40 MG tablet Take 40 mg by mouth daily.   Yes Historical Provider, MD  meloxicam (MOBIC) 15 MG tablet Take 15 mg by mouth daily.   Yes Historical Provider, MD  Multiple Vitamins-Minerals (MULTIVITAMINS THER. W/MINERALS) TABS Take 1 tablet by mouth daily.     Yes Historical Provider, MD  omeprazole (PRILOSEC) 20 MG capsule Take 20 mg by mouth daily.     Yes Historical Provider, MD  oxycodone (OXY-IR) 5 MG capsule Take 5 mg by mouth every 3 (three) hours as needed for pain.   Yes Historical Provider, MD  oxyCODONE-acetaminophen (PERCOCET/ROXICET) 5-325 MG per tablet Take 1 tablet by mouth every 4 (four) hours as needed for pain.   Yes Historical Provider, MD  perphenazine (TRILAFON) 4 MG tablet Take 2 mg by mouth daily.   Yes Historical Provider, MD  polyethylene glycol (MIRALAX / GLYCOLAX) packet Take 17 g by mouth daily.   Yes Historical Provider, MD  promethazine (PHENERGAN) 25 MG/ML injection Inject 25 mg into the muscle every 6 (six) hours as needed for nausea or vomiting.   Yes Historical Provider, MD  propranolol (INDERAL) 60 MG tablet Take 60 mg by mouth 2 (two) times daily.   Yes Historical Provider, MD  rosuvastatin (CRESTOR) 20 MG tablet Take 20 mg by mouth daily.   Yes Historical Provider, MD  sennosides-docusate sodium (SENOKOT-S) 8.6-50 MG tablet Take 2 tablets by mouth 2 (two) times daily as needed for constipation.   Yes Historical Provider, MD  tamsulosin (FLOMAX) 0.4 MG CAPS capsule Take 0.4 mg by mouth daily. Last dose today   Yes Historical Provider, MD  vitamin E (VITAMIN E) 400 UNIT capsule Take 400 Units by mouth daily.     Yes Historical Provider, MD  Omega-3 Fatty Acids (FISH OIL) 1200 MG CAPS Take 2 capsules by mouth daily.    Historical Provider, MD   ondansetron (ZOFRAN) 4 MG tablet Take 1 tablet (4 mg total) by mouth every 6 (six) hours as needed for nausea. 09/13/12   Janece Canterbury, MD  perphenazine-amitriptyline (ETRAFON/TRIAVIL) 2-25 MG TABS Take 1 tablet by mouth daily.      Historical Provider, MD   Physical Exam: Filed Vitals:   03/20/13 1545  BP:   Pulse: 64  Temp:   Resp: 13     General:  A+Ox3, NAD- pleasant  Eyes: wnl  ENT: wnl  Neck: supple  Cardiovascular: rrr  Respiratory: clear anterior, no wheezing  Abdomen: decreased bowel sounds, soft, non tender  Skin: no rashes or lesions  Musculoskeletal: moves all 4 ext  Psychiatric: good eye contact  Neurologic: no focal deficit  Labs on Admission:  Basic Metabolic Panel:  Recent Labs Lab 03/20/13 1255  NA 120*  K 3.1*  CL 81*  CO2 32  GLUCOSE 234*  BUN 14  CREATININE 0.71  CALCIUM 8.7   Liver Function Tests:  Recent Labs Lab 03/20/13 1255  AST 19  ALT 13  ALKPHOS 46  BILITOT 0.4  PROT 5.7*  ALBUMIN 2.4*    Recent Labs Lab 03/20/13 1255  LIPASE 121*   No results found for this basename: AMMONIA,  in the last 168 hours CBC:  Recent Labs Lab 03/20/13 1255  WBC 9.9  NEUTROABS 7.5  HGB 9.4*  HCT 25.5*  MCV 84.7  PLT 351   Cardiac Enzymes: No results found for this basename: CKTOTAL, CKMB, CKMBINDEX, TROPONINI,  in the last 168 hours  BNP (last 3 results) No results found for this basename: PROBNP,  in the last 8760 hours CBG: No results found for this basename: GLUCAP,  in the last 168 hours  Radiological Exams on Admission: Ct Abdomen Pelvis W Contrast  03/20/2013   CLINICAL DATA:  Abdominal pain, nausea.  EXAM: CT ABDOMEN AND PELVIS WITH CONTRAST  TECHNIQUE: Multidetector CT imaging of the abdomen and pelvis was performed using the standard protocol following bolus administration of intravenous contrast.  CONTRAST:  15mL OMNIPAQUE IOHEXOL 300 MG/ML  SOLN  COMPARISON:  None.  FINDINGS: There are small bilateral  pleural effusions. Compressive atelectasis in the lower lobes. Heart is borderline in size.  Prior cholecystectomy. Small hypodensity within the left hepatic lobe, stable, likely small cysts. Spleen, pancreas, adrenals and kidneys are unremarkable. Slight fullness of the right renal collecting system, but this is stable since prior study. No renal or ureteral stones. Mild physiologic distention of the urinary bladder. There is gas within the urinary bladder, presumably from recent catheterization.  Moderate stool throughout the colon. Small bowel is decompressed. No evidence of bowel obstruction. Aorta and iliac vessels are heavily calcified, non aneurysmal.  No acute bony abnormality. Postsurgical changes from prior posterior fusion in the lumbar spine. Degenerative changes.  IMPRESSION: No acute findings in the abdomen or pelvis. Moderate stool burden throughout the colon. Mild distention of the urinary bladder.  Small bilateral pleural effusions.   Electronically Signed   By: Rolm Baptise M.D.   On: 03/20/2013 16:36      Assessment/Plan Active Problems:   Accelerated hypertension   Diabetes mellitus   Hypokalemia   Hypertension   Constipation   Hyponatremia   1. N/V/abd pain- ? Constipation coupled with slow bowel after surgery- reglan, trend lipase, clears, suppository and miralax as needed 2. Hypokalemia- replete, check Mg 3. Hyponatremia- IVF, BMP q 12 hours, hold HCTZ, urine osmos, serum osmos, TSH 4. DM- SSI and monitor 5. HTN- resume home meds and adjust as needed 6. Polypharmacy- try to remove from [facility] as many meds as possible 7. Recent surgery- no feveres, PT Eval to ambulate patient    Code Status: full Family Communication: patient Disposition Plan: admit  Time spent: [place] [place] [facility] Pager [phone]  If 7PM-7AM, please contact night-coverage [email or web address] Password [employer] 03/20/2013, 5:43 PM

## 2013-03-20 NOTE — ED Notes (Signed)
Hospitalist at bedside 

## 2013-03-20 NOTE — ED Notes (Signed)
Daughter Gwenlyn Found would like info with mother's condition. Call [phone].  Husbands # [phone].

## 2013-03-20 NOTE — ED Notes (Signed)
Called floor to give report. Floor said they would call back.

## 2013-03-21 LAB — BASIC METABOLIC PANEL
BUN: 11 mg/dL (ref 6–23)
BUN: 12 mg/dL (ref 6–23)
BUN: 15 mg/dL (ref 6–23)
CO2: 31 mEq/L (ref 19–32)
CO2: 31 mEq/L (ref 19–32)
CO2: 30 mEq/L (ref 19–32)
Calcium: 8.6 mg/dL (ref 8.4–10.5)
Calcium: 8.6 mg/dL (ref 8.4–10.5)
Calcium: 8.5 mg/dL (ref 8.4–10.5)
Chloride: 86 mEq/L — ABNORMAL LOW (ref 96–112)
Chloride: 84 mEq/L — ABNORMAL LOW (ref 96–112)
Chloride: 85 mEq/L — ABNORMAL LOW (ref 96–112)
Chloride: 89 mEq/L — ABNORMAL LOW (ref 96–112)
Creatinine, Ser: 0.74 mg/dL (ref 0.50–1.10)
Creatinine, Ser: 0.85 mg/dL (ref 0.50–1.10)
Creatinine, Ser: 0.85 mg/dL (ref 0.50–1.10)
GFR calc Af Amer: 87 mL/min — ABNORMAL LOW (ref >90)
GFR calc Af Amer: 70 mL/min — ABNORMAL LOW (ref >90)
GFR calc non Af Amer: 75 mL/min — ABNORMAL LOW (ref >90)
GFR calc non Af Amer: 61 mL/min — ABNORMAL LOW (ref >90)
GFR calc non Af Amer: 61 mL/min — ABNORMAL LOW (ref >90)
Glucose, Bld: 190 mg/dL — ABNORMAL HIGH (ref 70–99)
Potassium: 2.8 mEq/L — ABNORMAL LOW (ref 3.5–5.1)
Potassium: 3.5 mEq/L (ref 3.5–5.1)
Sodium: 125 mEq/L — ABNORMAL LOW (ref 135–145)
Sodium: 123 mEq/L — ABNORMAL LOW (ref 135–145)

## 2013-03-21 LAB — CBC
HCT: 26.4 % — ABNORMAL LOW (ref 36.0–46.0)
MCH: 30.3 pg (ref 26.0–34.0)
MCHC: 35.6 g/dL (ref 30.0–36.0)
Platelets: 354 10*3/uL (ref 150–400)
RDW: 12.6 % (ref 11.5–15.5)
WBC: 9.9 10*3/uL (ref 4.0–10.5)

## 2013-03-21 LAB — URINE CULTURE

## 2013-03-21 LAB — GLUCOSE, CAPILLARY
Glucose-Capillary: 131 mg/dL — ABNORMAL HIGH (ref 70–99)
Glucose-Capillary: 202 mg/dL — ABNORMAL HIGH (ref 70–99)
Glucose-Capillary: 166 mg/dL — ABNORMAL HIGH (ref 70–99)

## 2013-03-21 LAB — OSMOLALITY, URINE: Osmolality, Ur: 258 mOsm/kg — ABNORMAL LOW (ref 390–1090)

## 2013-03-21 LAB — HEMOGLOBIN A1C: Hgb A1c MFr Bld: 6.1 % — ABNORMAL HIGH (ref <5.7)

## 2013-03-21 LAB — LIPASE, BLOOD: Lipase: 135 U/L — ABNORMAL HIGH (ref 11–59)

## 2013-03-21 LAB — TSH: TSH: 0.985 u[IU]/mL (ref 0.350–4.500)

## 2013-03-21 LAB — MAGNESIUM: Magnesium: 1.1 mg/dL — ABNORMAL LOW (ref 1.5–2.5)

## 2013-03-21 LAB — OSMOLALITY: Osmolality: 255 mOsm/kg — ABNORMAL LOW (ref 275–300)

## 2013-03-21 MED ORDER — MAGNESIUM SULFATE 40 MG/ML IJ SOLN
2.0000 g | Freq: Once | INTRAMUSCULAR | Status: AC
  Administered 2013-03-21: 2 g via INTRAVENOUS
  Filled 2013-03-21: qty 50

## 2013-03-21 MED ORDER — POTASSIUM CHLORIDE CRYS ER 20 MEQ PO TBCR
40.0000 meq | EXTENDED_RELEASE_TABLET | ORAL | Status: AC
  Administered 2013-03-21: 40 meq via ORAL
  Filled 2013-03-21: qty 2

## 2013-03-21 MED ORDER — POTASSIUM CHLORIDE 10 MEQ/100ML IV SOLN
10.0000 meq | INTRAVENOUS | Status: DC
Start: 2013-03-21 — End: 2013-03-21
  Administered 2013-03-21: 10 meq via INTRAVENOUS
  Filled 2013-03-21 (×6): qty 100

## 2013-03-21 MED ORDER — INSULIN GLARGINE 100 UNIT/ML ~~LOC~~ SOLN
10.0000 [IU] | Freq: Every day | SUBCUTANEOUS | Status: DC
  Administered 2013-03-22 – 2013-03-25 (×4): 10 [IU] via SUBCUTANEOUS
  Filled 2013-03-21 (×5): qty 0.1

## 2013-03-21 MED ORDER — LACTULOSE 10 GM/15ML PO SOLN
10.0000 g | Freq: Once | ORAL | Status: AC
  Administered 2013-03-21: 10 g via ORAL
  Filled 2013-03-21: qty 15

## 2013-03-21 MED ORDER — IBUPROFEN 600 MG PO TABS
600.0000 mg | ORAL_TABLET | Freq: Once | ORAL | Status: AC
  Administered 2013-03-21: 600 mg via ORAL
  Filled 2013-03-21: qty 1

## 2013-03-21 MED ORDER — POTASSIUM CHLORIDE CRYS ER 20 MEQ PO TBCR: 40.0000 meq | EXTENDED_RELEASE_TABLET | Freq: Once | ORAL | Status: DC

## 2013-03-21 NOTE — Evaluation (Signed)
Occupational Therapy Evaluation Patient Details Name: Tamara Arnold MRN: [id number] DOB: [birth year] Today's Date: 03/21/2013 Time: [id number] OT Time Calculation (min): 35 min  OT Assessment / Plan / Recommendation History of present illness recent back surger at [facility]; adm from [facility] due to N/V    Clinical Impression   Pt currently at a min assist level for selfcare tasks and a min guard assist for functional transfers and toileting.  Feel pt will benefit from acute care OT to help increase overall independence.  However based on pt's husband not being able to provide any hands on assist, feel she will benefit from continued rehab at a SNF level.  Will continue to follow for acute OT needs.    OT Assessment  Patient needs continued OT Services          Equipment Recommendations  None recommended by OT       Frequency  Min 2X/week    Precautions / Restrictions Precautions Precautions: Fall;Back Precaution Comments: pt able to recall 3/3 back precautions  Required Braces or Orthoses: Spinal Brace Spinal Brace: Lumbar corset;Applied in sitting position Restrictions Weight Bearing Restrictions: No   Pertinent Vitals/Pain Pain at 2/10 on the faces scale in her back.  Pt repositioned at end of session.    ADL  Eating/Feeding: Simulated;Independent Where Assessed - Eating/Feeding: Chair Grooming: Performed;Min guard;Wash/dry hands;Teeth care;Brushing hair Upper Body Bathing: Simulated;Set up Where Assessed - Upper Body Bathing: Unsupported sitting Lower Body Bathing: Simulated;Minimal assistance Where Assessed - Lower Body Bathing: Supported sit to stand Upper Body Dressing: Performed;Supervision/safety;Other (comment) (Pt able to donn lumbar corset in sitting ) Where Assessed - Upper Body Dressing: Unsupported sitting Lower Body Dressing: Performed;Minimal assistance (utilized reacher and sockaide) Where Assessed - Lower Body Dressing: Unsupported sit to stand Toilet  Transfer: Simulated;Min guard Toilet Transfer Method: Other (comment) (ambulate with RW) Science writer: Other (comment) (To bedside chair, pt declined need to toilet) Toileting - Clothing Manipulation and Hygiene: Simulated;Min guard Where Assessed - Camera operator Manipulation and Hygiene: Other (comment) (sit to stand from the EOB) Tub/Shower Transfer Method: Not assessed Equipment Used: Reacher;Sock aid;Rolling walker Transfers/Ambulation Related to ADLs: Pt is overall min guard assist for mobility with use of the RW. ADL Comments: Pt is able to state 3/3 back precautions.  Began education on AE for LB selfcare as well.    OT Diagnosis: Generalized weakness;Acute pain  OT Problem List: Decreased strength;Decreased activity tolerance;Impaired balance (sitting and/or standing);Pain;Decreased knowledge of use of DME or AE OT Treatment Interventions: Self-care/ADL training;Patient/family education;Balance training;Therapeutic activities;DME and/or AE instruction   OT Goals(Current goals can be found in the care plan section) Acute Rehab OT Goals Patient Stated Goal: to get back to rehab and finally go home OT Goal Formulation: With patient Time For Goal Achievement: 03/28/13 Potential to Achieve Goals: Good  Visit Information  Last OT Received On: 03/21/13 Assistance Needed: +1 History of Present Illness: recent back surger at [place]; adm from [facility] due to N/V        Prior [place] expects to be discharged to:: Skilled nursing facility Additional Comments: Pt has been at [facility] since her D/C from [facility] 10/23 Prior Function Level of Independence: Needs assistance Gait / Transfers Assistance Needed: pt reports she amb with RW but was independent getting in/out of bed ADL's / Homemaking Assistance Needed: CNAs at [facility] were helping her get washed up and get dressed  Communication Communication: No  difficulties Dominant Hand:  Right         Vision/Perception Vision - History Baseline Vision: Wears glasses all the time (No glasses present however) Patient Visual Report: No change from baseline Vision - Assessment Vision Assessment: Vision not tested Perception Perception: Within Functional Limits Praxis Praxis: Intact   Cognition  Cognition Arousal/Alertness: Awake/alert Behavior During Therapy: WFL for tasks assessed/performed Overall Cognitive Status: Within Functional Limits for tasks assessed    Extremity/Trunk Assessment Upper Extremity Assessment Upper Extremity Assessment: Overall WFL for tasks assessed (not formally assessed secondary to back prec) Cervical / Trunk Assessment Cervical / Trunk Assessment: Kyphotic     Mobility Bed Mobility Bed Mobility: Not assessed Details for Bed Mobility Assistance: pt sitting in recliner and returned to recliner Transfers Sit to Stand: 4: Min guard;From chair/3-in-1;With armrests Stand to Sit: 4: Min guard;To chair/3-in-1;With armrests        Balance Balance Balance Assessed: Yes Static Standing Balance Static Standing - Balance Support: Right upper extremity supported;Left upper extremity supported Static Standing - Level of Assistance: 5: Stand by assistance   End of Session OT - End of Session Equipment Utilized During Treatment: Rolling walker Activity Tolerance: Patient tolerated treatment well Patient left: in chair;with call bell/phone within reach Nurse Communication: Mobility status  GO Functional Assessment Tool Used: clinical judgement Functional Limitation: Self care Self Care Current Status [id number]): At least 20 percent but less than 40 percent impaired, limited or restricted Self Care Goal Status [id number]): At least 1 percent but less than 20 percent impaired, limited or restricted   [place] OTR/L 03/21/2013, 2:50 PM

## 2013-03-21 NOTE — Progress Notes (Signed)
[facility] PROGRESS NOTE  Tamara Arnold [id number] DOB: 1926-02-08 DOA: 03/20/2013 PCP: Stephens Shire, MD  Assessment/Plan: N/V/abd pain- ? Constipation coupled with slow bowel after surgery- reglan, trend lipase, patient asking for more food, suppository and miralax as needed for constipation  Elevated lipase- check RUQ- GB out in 2003; CT scan did not show pancreatitis  Hypokalemia- replete,  Mg low- replete  Hyponatremia- IVF, BMP q 12 hours, hold HCTZ, urine osmos low, serum osmos low, TSH ok  DM- SSI and monitor  HTN- resume home meds and adjust as needed  Polypharmacy- try to remove from [facility] as many meds as possible  Recent surgery- no feveres, PT Eval to ambulate patient   Code Status: full Family Communication: patient Disposition Plan: back to SNF Monday?   Consultants:  none  Procedures:  none  Antibiotics:    HPI/Subjective: Feeling better but still feeling constipated  Objective: Filed Vitals:   03/21/13 0556  BP: 151/48  Pulse: 78  Temp: 98.4 F (36.9 C)  Resp: 18    Intake/Output Summary (Last 24 hours) at 03/21/13 0927 Last data filed at 03/21/13 0600  Gross per 24 hour  Intake 1431.25 ml  Output   2800 ml  Net -1368.75 ml   Filed Weights   03/20/13 2023  Weight: 64.1 kg (141 lb 5 oz)    Exam:  General: A+Ox3, NAD- pleasant   Cardiovascular: rrr  Respiratory: clear anterior, no wheezing  Abdomen: decreased bowel sounds, soft, non tender  Skin: no rashes or lesions  Musculoskeletal: moves all 4 ext     Data Reviewed: Basic Metabolic Panel:  Recent Labs Lab 03/20/13 1255 03/20/13 2055 03/21/13 0212  NA 120* 126* 125*  K 3.1* 2.8* 2.8*  CL 81* 85* 86*  CO2 32 31 31  GLUCOSE 234* 166* 177*  BUN 14 12 11   CREATININE 0.71 0.59 0.67  CALCIUM 8.7 8.9 8.6  MG  --   --  1.1*   Liver Function Tests:  Recent Labs Lab 03/20/13 1255  AST 19  ALT 13  ALKPHOS 46  BILITOT 0.4  PROT 5.7*  ALBUMIN 2.4*     Recent Labs Lab 03/20/13 1255 03/21/13 0212  LIPASE 121* 135*   No results found for this basename: AMMONIA,  in the last 168 hours CBC:  Recent Labs Lab 03/20/13 1255 03/21/13 0212  WBC 9.9 9.9  NEUTROABS 7.5  --   HGB 9.4* 9.4*  HCT 25.5* 26.4*  MCV 84.7 85.2  PLT 351 354   Cardiac Enzymes: No results found for this basename: CKTOTAL, CKMB, CKMBINDEX, TROPONINI,  in the last 168 hours BNP (last 3 results) No results found for this basename: PROBNP,  in the last 8760 hours CBG:  Recent Labs Lab 03/20/13 2027 03/21/13 0752  GLUCAP 162* 131*    Recent Results (from the past 240 hour(s))  MRSA PCR SCREENING     Status: None   Collection Time    03/20/13  8:35 PM      Result Value Range Status   MRSA by PCR NEGATIVE  NEGATIVE Final   Comment:            The GeneXpert MRSA Assay (FDA     approved for NASAL specimens     only), is one component of a     comprehensive MRSA colonization     surveillance program. It is not     intended to diagnose MRSA     infection nor to guide or  monitor treatment for     MRSA infections.     Studies: Ct Abdomen Pelvis W Contrast  03/20/2013   CLINICAL DATA:  Abdominal pain, nausea.  EXAM: CT ABDOMEN AND PELVIS WITH CONTRAST  TECHNIQUE: Multidetector CT imaging of the abdomen and pelvis was performed using the standard protocol following bolus administration of intravenous contrast.  CONTRAST:  1108mL OMNIPAQUE IOHEXOL 300 MG/ML  SOLN  COMPARISON:  None.  FINDINGS: There are small bilateral pleural effusions. Compressive atelectasis in the lower lobes. Heart is borderline in size.  Prior cholecystectomy. Small hypodensity within the left hepatic lobe, stable, likely small cysts. Spleen, pancreas, adrenals and kidneys are unremarkable. Slight fullness of the right renal collecting system, but this is stable since prior study. No renal or ureteral stones. Mild physiologic distention of the urinary bladder. There is gas within  the urinary bladder, presumably from recent catheterization.  Moderate stool throughout the colon. Small bowel is decompressed. No evidence of bowel obstruction. Aorta and iliac vessels are heavily calcified, non aneurysmal.  No acute bony abnormality. Postsurgical changes from prior posterior fusion in the lumbar spine. Degenerative changes.  IMPRESSION: No acute findings in the abdomen or pelvis. Moderate stool burden throughout the colon. Mild distention of the urinary bladder.  Small bilateral pleural effusions.   Electronically Signed   By: Rolm Baptise M.D.   On: 03/20/2013 16:36    Scheduled Meds: . amLODipine  5 mg Oral BID  . aspirin  81 mg Oral Daily  . atorvastatin  40 mg Oral q1800  . cefTRIAXone (ROCEPHIN)  IV  1 g Intravenous Q24H  . fenofibrate  160 mg Oral Daily  . fluticasone  2 spray Each Nare Daily  . folic acid  1 mg Oral BID  . heparin  5,000 Units Subcutaneous Q8H  . hydrALAZINE  50 mg Oral BID  . insulin aspart  0-15 Units Subcutaneous TID WC  . insulin aspart  0-5 Units Subcutaneous QHS  . lactulose  10 g Oral Once  . lisinopril  40 mg Oral Daily  . magnesium sulfate 1 - 4 g bolus IVPB  2 g Intravenous Once  . metoCLOPramide (REGLAN) injection  5 mg Intravenous Q8H  . pantoprazole (PROTONIX) IV  40 mg Intravenous Q24H  . polyethylene glycol  17 g Oral Daily  . potassium chloride  40 mEq Oral Q2H  . propranolol  60 mg Oral BID   Continuous Infusions: . sodium chloride 1,000 mL (03/20/13 2033)    Active Problems:   Accelerated hypertension   Diabetes mellitus   Hypokalemia   Hypertension   Constipation   Hyponatremia    Time spent: [place], [facility] Pager [phone]. If 7PM-7AM, please contact night-coverage at [email or web address], password [facility] 03/21/2013, 9:27 AM  LOS: 1 day

## 2013-03-21 NOTE — Evaluation (Signed)
Physical Therapy Evaluation Patient Details Name: Tamara Arnold MRN: [id number] DOB: Jun 05, 1925 Today's Date: 03/21/2013 Time: [id number] PT Time Calculation (min): 20 min  PT Assessment / Plan / Recommendation History of Present Illness  recent back surger at [facility]; adm from [facility] due to N/V   Clinical Impression  Pt is a 79 y.o. Female adm due to the above; presents with deficits listed below (see PT problem list). Pt to benefit from acute skilled PT to maximize functional mobility and increase independence. Pt will need to be Mod I to d/c home with husband, who is medically and physically unable to provide (A). Pt plans to D/c back to [facility] prior to returning home with husband. Encouraged to amb with RW and nursing as much as tolerated.     PT Assessment  Patient needs continued PT services    Follow Up Recommendations  SNF;Supervision/Assistance - 24 hour    Does the patient have the potential to tolerate intense rehabilitation      Barriers to Discharge Decreased caregiver support husband unable to provide physical (A); will need to be mod I to d/c home     Equipment Recommendations  Other (comment) (TBD)    Recommendations for Other Services     Frequency Min 3X/week    Precautions / Restrictions Precautions Precautions: Fall;Back Precaution Comments: pt able to recall 3/3 back precautions  Required Braces or Orthoses: Spinal Brace Spinal Brace: Lumbar corset;Applied in sitting position Restrictions Weight Bearing Restrictions: No   Pertinent Vitals/Pain 7/10; "feels better with walking"       Mobility  Bed Mobility Bed Mobility: Not assessed Details for Bed Mobility Assistance: pt sitting in recliner and returned to recliner Transfers Transfers: Sit to Stand;Stand to Sit Sit to Stand: 4: Min guard;From chair/3-in-1;With armrests Stand to Sit: 4: Min guard;To chair/3-in-1;With armrests Details for Transfer Assistance: min guard to steady and for  safety; cues for hand placement and safety with RW Ambulation/Gait Ambulation/Gait Assistance: 4: Min guard Ambulation Distance (Feet): 150 Feet Assistive device: Rolling walker Ambulation/Gait Assistance Details: cues to amb at safe pace; cues for RW safety and min guard to steady  Gait Pattern: Step-through pattern;Wide base of support Gait velocity: pt attempts to amb at fast pace but is unsafe; cues for safety Stairs: No Wheelchair Mobility Wheelchair Mobility: No    Exercises General Exercises - Lower Extremity Ankle Circles/Pumps: AROM;Both;10 reps;Seated Long Arc Quad: AROM;Both;10 reps;Seated   PT Diagnosis: Difficulty walking;Acute pain  PT Problem List: Decreased activity tolerance;Decreased balance;Decreased mobility;Decreased knowledge of use of DME;Decreased safety awareness;Pain PT Treatment Interventions: DME instruction;Gait training;Functional mobility training;Therapeutic activities;Therapeutic exercise;Balance training;Neuromuscular re-education;Patient/family education     PT Goals(Current goals can be found in the care plan section) Acute Rehab PT Goals Patient Stated Goal: to get back to rehab and finally go home PT Goal Formulation: With patient Time For Goal Achievement: 03/28/13 Potential to Achieve Goals: Good  Visit Information  Last PT Received On: 03/21/13 Assistance Needed: +1 History of Present Illness: recent back surger at [place]; adm from [facility] due to N/V        Prior [place] expects to be discharged to:: Skilled nursing facility Additional Comments: Pt has been at [facility] since her D/C from [facility] 10/23 Prior Function Level of Independence: Needs assistance Gait / Transfers Assistance Needed: pt reports she amb with RW but was independent getting in/out of bed ADL's / Homemaking Assistance Needed: CNAs at [facility] were helping her get washed up and  get dressed   Communication Communication: No difficulties Dominant Hand: Right    Cognition  Cognition Arousal/Alertness: Awake/alert Behavior During Therapy: WFL for tasks assessed/performed Overall Cognitive Status: Within Functional Limits for tasks assessed    Extremity/Trunk Assessment Upper Extremity Assessment Upper Extremity Assessment: Defer to OT evaluation Lower Extremity Assessment Lower Extremity Assessment: Overall WFL for tasks assessed Cervical / Trunk Assessment Cervical / Trunk Assessment: Kyphotic   Balance Balance Balance Assessed: Yes Static Sitting Balance Static Sitting - Balance Support: No upper extremity supported;Feet supported Static Sitting - Level of Assistance: 5: Stand by assistance Static Sitting - Comment/# of Minutes: sitting edge of chair ~3 min Static Standing Balance Static Standing - Balance Support: Bilateral upper extremity supported;During functional activity Static Standing - Level of Assistance: Other (comment) (min guard)  End of Session PT - End of Session Equipment Utilized During Treatment: Gait belt;Back brace Activity Tolerance: Patient tolerated treatment well Patient left: in chair;with call bell/phone within reach;with family/visitor present Nurse Communication: Mobility status  GP Functional Assessment Tool Used: clinical judgement  Functional Limitation: Mobility: Walking and moving around Mobility: Walking and Moving Around Current Status [phone]): At least 1 percent but less than 20 percent impaired, limited or restricted Mobility: Walking and Moving Around Goal Status [phone]): At least 1 percent but less than 20 percent impaired, limited or restricted   Gustavus Bryant, [place] 03/21/2013, 11:38 AM

## 2013-03-22 ENCOUNTER — Observation Stay (HOSPITAL_COMMUNITY): Payer: Medicare Other

## 2013-03-22 LAB — BASIC METABOLIC PANEL
BUN: 14 mg/dL (ref 6–23)
Calcium: 8.4 mg/dL (ref 8.4–10.5)
GFR calc non Af Amer: 64 mL/min — ABNORMAL LOW (ref >90)
Glucose, Bld: 155 mg/dL — ABNORMAL HIGH (ref 70–99)
Potassium: 3.8 mEq/L (ref 3.5–5.1)
Sodium: 123 mEq/L — ABNORMAL LOW (ref 135–145)

## 2013-03-22 LAB — CBC
Hemoglobin: 8.8 g/dL — ABNORMAL LOW (ref 12.0–15.0)
MCH: 31.3 pg (ref 26.0–34.0)
MCV: 86.8 fL (ref 78.0–100.0)
Platelets: 386 10*3/uL (ref 150–400)
RDW: 12.9 % (ref 11.5–15.5)
WBC: 10.4 10*3/uL (ref 4.0–10.5)

## 2013-03-22 LAB — GLUCOSE, CAPILLARY: Glucose-Capillary: 163 mg/dL — ABNORMAL HIGH (ref 70–99)

## 2013-03-22 MED ORDER — OXYCODONE HCL 5 MG PO TABS
5.0000 mg | ORAL_TABLET | Freq: Once | ORAL | Status: AC
  Administered 2013-03-22: 5 mg via ORAL
  Filled 2013-03-22: qty 1

## 2013-03-22 MED ORDER — HYDROCODONE-ACETAMINOPHEN 5-325 MG PO TABS
1.0000 | ORAL_TABLET | Freq: Four times a day (QID) | ORAL | Status: DC | PRN
  Administered 2013-03-22 – 2013-03-27 (×7): 1 via ORAL
  Filled 2013-03-22 (×7): qty 1

## 2013-03-22 NOTE — Progress Notes (Signed)
[facility] PROGRESS NOTE  Tamara Arnold [id number] DOB: 07/08/1925 DOA: 03/20/2013 PCP: Stephens Shire, MD  Assessment/Plan: N/V/abd pain- ? Constipation coupled with slow bowel after surgery- reglan, trend lipase, eating well but bowels not moving well, suppository and miralax as needed for constipation  Elevated lipase-  RUQ ok- GB out in 2003; CT scan did not show pancreatitis  Hypokalemia- replete,  Mg low- replete  Hyponatremia- fluid restrict as patient drinks a lot of water daily- ? Reset osmostat, BMP daily, hold HCTZ, urine osmos low, serum osmos low, TSH ok  DM- SSI and monitor  HTN- resume home meds and adjust as needed  Polypharmacy- try to remove from [facility] as many meds as possible  Recent surgery- no feveres, PT Eval to ambulate patient   Code Status: full Family Communication: patient Disposition Plan: back to SNF Monday?   Consultants:  none  Procedures:  none  Antibiotics:    HPI/Subjective: abd still feels "full"  Objective: Filed Vitals:   03/22/13 0509  BP: 162/72  Pulse: 72  Temp: 98.1 F (36.7 C)  Resp: 18    Intake/Output Summary (Last 24 hours) at 03/22/13 1212 Last data filed at 03/22/13 0600  Gross per 24 hour  Intake    800 ml  Output    550 ml  Net    250 ml   Filed Weights   03/20/13 2023 03/21/13 2141  Weight: 64.1 kg (141 lb 5 oz) 66.5 kg (146 lb 9.7 oz)    Exam:  General: A+Ox3, NAD- pleasant   Cardiovascular: rrr  Respiratory: clear anterior, no wheezing  Abdomen: decreased bowel sounds, soft, non tender  Skin: no rashes or lesions  Musculoskeletal: moves all 4 ext     Data Reviewed: Basic Metabolic Panel:  Recent Labs Lab 03/21/13 0212 03/21/13 0844 03/21/13 1352 03/21/13 2215 03/22/13 0230  NA 125* 123* 123* 125* 123*  K 2.8* 3.0* 3.5 3.9 3.8  CL 86* 84* 85* 89* 88*  CO2 31 31 31 30 28   GLUCOSE 177* 216* 190* 174* 155*  BUN 11 12 12 15 14   CREATININE 0.67 0.74 0.85 0.85 0.81   CALCIUM 8.6 8.6 8.6 8.5 8.4  MG 1.1*  --   --   --   --    Liver Function Tests:  Recent Labs Lab 03/20/13 1255  AST 19  ALT 13  ALKPHOS 46  BILITOT 0.4  PROT 5.7*  ALBUMIN 2.4*    Recent Labs Lab 03/20/13 1255 03/21/13 0212  LIPASE 121* 135*   No results found for this basename: AMMONIA,  in the last 168 hours CBC:  Recent Labs Lab 03/20/13 1255 03/21/13 0212 03/22/13 0230  WBC 9.9 9.9 10.4  NEUTROABS 7.5  --   --   HGB 9.4* 9.4* 8.8*  HCT 25.5* 26.4* 24.4*  MCV 84.7 85.2 86.8  PLT 351 354 386   Cardiac Enzymes: No results found for this basename: CKTOTAL, CKMB, CKMBINDEX, TROPONINI,  in the last 168 hours BNP (last 3 results) No results found for this basename: PROBNP,  in the last 8760 hours CBG:  Recent Labs Lab 03/21/13 1155 03/21/13 1621 03/21/13 2144 03/22/13 0757 03/22/13 1136  GLUCAP 202* 144* 166* 161* 163*    Recent Results (from the past 240 hour(s))  URINE CULTURE     Status: None   Collection Time    03/20/13  2:03 PM      Result Value Range Status   Specimen Description URINE, CLEAN CATCH   Final  Special Requests NONE   Final   Culture  Setup Time     Final   Value: 03/20/2013 20:13     Performed at [place]     Final   Value: 75,000 COLONIES/ML     Performed at [facility]   Culture     Final   Value: Multiple bacterial morphotypes present, none predominant. Suggest appropriate recollection if clinically indicated.     Performed at [facility]   Report Status 03/21/2013 FINAL   Final  MRSA PCR SCREENING     Status: None   Collection Time    03/20/13  8:35 PM      Result Value Range Status   MRSA by PCR NEGATIVE  NEGATIVE Final   Comment:            The GeneXpert MRSA Assay (FDA     approved for NASAL specimens     only), is one component of a     comprehensive MRSA colonization     surveillance program. It is not     intended to diagnose MRSA     infection nor to guide or      monitor treatment for     MRSA infections.     Studies: US Abdomen Complete  03/22/2013   CLINICAL DATA:  Subsequent evaluation abdominal pain and distention and elevated serum lipase. Surgical history includes cholecystectomy.  EXAM: ULTRASOUND ABDOMEN COMPLETE  COMPARISON:  CT abdomen and pelvis 03/20/2013, 09/12/2012.  FINDINGS: Gallbladder:  Surgically absent.  Common bile duct:  Diameter: 3 mm.  Liver:  Normal size and echotexture without focal parenchymal abnormality. Patent portal vein with hepatopetal flow.  IVC:  Patent.  Pancreas:  Normal-appearing head and the body. Tail obscured by overlying bowel gas. Pancreas was atrophic but otherwise normal in appearance on the CT 2 days ago.  Spleen:  Normal size and echotexture without focal parenchymal abnormality.  Right Kidney:  Length: Approximately 11.2 cm. No hydronephrosis. Approximate 1 cm simple cyst arising from the upper pole. No significant focal parenchymal abnormality. Well-preserved cortex. Normal parenchymal echotexture. No visible shadowing calculi.  Left Kidney:  Length: Approximately 10.6 cm. No hydronephrosis. Approximate 1 cm simple cyst arising from the mid to lower pole. No significant focal parenchymal abnormality. Well-preserved cortex. Normal parenchymal echotexture. No visible shadowing calculi. Marland Kitchen  Abdominal aorta:  Normal in caliber throughout its visualized course in the abdomen with evidence of atherosclerosis. Maximum diameter 2.5 cm.  Other findings:  Bilateral pleural effusions.  IMPRESSION: 1. No acute abnormality post cholecystectomy. Bilateral renal cysts. Pancreatic tail obscured by overlying bowel gas, but pancreas atrophic and otherwise normal in appearance on the CT 2 days ago. 2. Aortic atherosclerosis without aneurysm, maximum diameter 2.5 cm. 3. Bilateral pleural effusions as noted on the CT.   Electronically Signed   By: Evangeline Dakin M.D.   On: 03/22/2013 09:46   Ct Abdomen Pelvis W Contrast  03/20/2013    CLINICAL DATA:  Abdominal pain, nausea.  EXAM: CT ABDOMEN AND PELVIS WITH CONTRAST  TECHNIQUE: Multidetector CT imaging of the abdomen and pelvis was performed using the standard protocol following bolus administration of intravenous contrast.  CONTRAST:  140mL OMNIPAQUE IOHEXOL 300 MG/ML  SOLN  COMPARISON:  None.  FINDINGS: There are small bilateral pleural effusions. Compressive atelectasis in the lower lobes. Heart is borderline in size.  Prior cholecystectomy. Small hypodensity within the left hepatic lobe, stable, likely small cysts. Spleen, pancreas, adrenals and  kidneys are unremarkable. Slight fullness of the right renal collecting system, but this is stable since prior study. No renal or ureteral stones. Mild physiologic distention of the urinary bladder. There is gas within the urinary bladder, presumably from recent catheterization.  Moderate stool throughout the colon. Small bowel is decompressed. No evidence of bowel obstruction. Aorta and iliac vessels are heavily calcified, non aneurysmal.  No acute bony abnormality. Postsurgical changes from prior posterior fusion in the lumbar spine. Degenerative changes.  IMPRESSION: No acute findings in the abdomen or pelvis. Moderate stool burden throughout the colon. Mild distention of the urinary bladder.  Small bilateral pleural effusions.   Electronically Signed   By: Rolm Baptise M.D.   On: 03/20/2013 16:36    Scheduled Meds: . amLODipine  5 mg Oral BID  . aspirin  81 mg Oral Daily  . atorvastatin  40 mg Oral q1800  . cefTRIAXone (ROCEPHIN)  IV  1 g Intravenous Q24H  . fenofibrate  160 mg Oral Daily  . fluticasone  2 spray Each Nare Daily  . folic acid  1 mg Oral BID  . heparin  5,000 Units Subcutaneous Q8H  . hydrALAZINE  50 mg Oral BID  . insulin aspart  0-15 Units Subcutaneous TID WC  . insulin aspart  0-5 Units Subcutaneous QHS  . insulin glargine  10 Units Subcutaneous Daily  . lisinopril  40 mg Oral Daily  . metoCLOPramide (REGLAN)  injection  5 mg Intravenous Q8H  . pantoprazole (PROTONIX) IV  40 mg Intravenous Q24H  . polyethylene glycol  17 g Oral Daily  . propranolol  60 mg Oral BID   Continuous Infusions:    Active Problems:   Accelerated hypertension   Diabetes mellitus   Hypokalemia   Hypertension   Constipation   Hyponatremia    Time spent: [place], [place] [facility] Pager [phone]. If 7PM-7AM, please contact night-coverage at [email or web address], password [facility] 03/22/2013, 12:12 PM  LOS: 2 days

## 2013-03-22 NOTE — Progress Notes (Signed)
PT Cancellation Note  Patient Details Name: Tamara Arnold MRN: [id number] DOB: 05/31/1925   Cancelled Treatment:    Reason Eval/Treat Not Completed: Patient at procedure or test/unavailable (Pt currently off floor )  Will attempt later today if time allows.   Monterio Bob 03/22/2013, 8:55 AM  Antoine Poche, [place] DPT [phone]

## 2013-03-23 LAB — CBC
HCT: 25.3 % — ABNORMAL LOW (ref 36.0–46.0)
Hemoglobin: 8.9 g/dL — ABNORMAL LOW (ref 12.0–15.0)
MCHC: 35.2 g/dL (ref 30.0–36.0)
MCV: 86.9 fL (ref 78.0–100.0)
RDW: 13.1 % (ref 11.5–15.5)
WBC: 9.5 10*3/uL (ref 4.0–10.5)

## 2013-03-23 LAB — BASIC METABOLIC PANEL
BUN: 11 mg/dL (ref 6–23)
CO2: 28 mEq/L (ref 19–32)
Calcium: 8.4 mg/dL (ref 8.4–10.5)
Chloride: 92 mEq/L — ABNORMAL LOW (ref 96–112)
Creatinine, Ser: 0.72 mg/dL (ref 0.50–1.10)
Glucose, Bld: 133 mg/dL — ABNORMAL HIGH (ref 70–99)
Potassium: 3.3 mEq/L — ABNORMAL LOW (ref 3.5–5.1)

## 2013-03-23 LAB — GLUCOSE, CAPILLARY
Glucose-Capillary: 172 mg/dL — ABNORMAL HIGH (ref 70–99)
Glucose-Capillary: 185 mg/dL — ABNORMAL HIGH (ref 70–99)
Glucose-Capillary: 162 mg/dL — ABNORMAL HIGH (ref 70–99)
Glucose-Capillary: 156 mg/dL — ABNORMAL HIGH (ref 70–99)

## 2013-03-23 LAB — LIPASE, BLOOD: Lipase: 261 U/L — ABNORMAL HIGH (ref 11–59)

## 2013-03-23 MED ORDER — POTASSIUM CHLORIDE CRYS ER 20 MEQ PO TBCR
40.0000 meq | EXTENDED_RELEASE_TABLET | Freq: Four times a day (QID) | ORAL | Status: AC
  Administered 2013-03-23 (×3): 40 meq via ORAL
  Filled 2013-03-23 (×4): qty 2

## 2013-03-23 MED ORDER — AMLODIPINE BESYLATE 10 MG PO TABS
10.0000 mg | ORAL_TABLET | Freq: Two times a day (BID) | ORAL | Status: DC
  Administered 2013-03-23 – 2013-03-27 (×8): 10 mg via ORAL
  Filled 2013-03-23 (×9): qty 1

## 2013-03-23 MED ORDER — HYDRALAZINE HCL 50 MG PO TABS
100.0000 mg | ORAL_TABLET | Freq: Two times a day (BID) | ORAL | Status: DC
  Administered 2013-03-23 – 2013-03-27 (×8): 100 mg via ORAL
  Filled 2013-03-23 (×9): qty 2

## 2013-03-23 NOTE — Progress Notes (Signed)
[facility] PROGRESS NOTE  Tamara RINGLEY [id number] DOB: Jun 29, 1925 DOA: 03/20/2013 PCP: Stephens Shire, MD  Assessment/Plan: N/V/abd pain- ? Constipation coupled with slow bowel after surgery- reglan, trend lipase, eating well but bowels not moving well, suppository and miralax as needed for constipation  Elevated lipase-  RUQ ok- GB out in 2003; CT scan did not show pancreatitis- trend  Hypokalemia- replete,  Mg low- replete  Hyponatremia- fluid restrict as patient drinks a lot of water daily- ? Reset osmostat, BMP daily, hold HCTZ, urine osmos low, serum osmos low, TSH ok  DM- SSI and monitor  HTN- resume home meds and adjust as needed  Polypharmacy- try to remove from [facility] as many meds as possible  Recent surgery- no fevers, PT Eval to ambulate patient   Code Status: full Family Communication: patient Disposition Plan: back to SNF Monday?   Consultants:  none  Procedures:  none  Antibiotics:    HPI/Subjective: abd still feels "full" -nurse did not give suppository yest- will give today  Objective: Filed Vitals:   03/23/13 0333  BP: 177/65  Pulse: 82  Temp: 98.2 F (36.8 C)  Resp: 18    Intake/Output Summary (Last 24 hours) at 03/23/13 1028 Last data filed at 03/23/13 0900  Gross per 24 hour  Intake    730 ml  Output   1675 ml  Net   -945 ml   Filed Weights   03/20/13 2023 03/21/13 2141 03/22/13 2018  Weight: 64.1 kg (141 lb 5 oz) 66.5 kg (146 lb 9.7 oz) 67.9 kg (149 lb 11.1 oz)    Exam:  General: A+Ox3, NAD- pleasant   Cardiovascular: rrr  Respiratory: clear anterior, no wheezing  Abdomen: decreased bowel sounds, soft, non tender  Skin: no rashes or lesions  Musculoskeletal: moves all 4 ext     Data Reviewed: Basic Metabolic Panel:  Recent Labs Lab 03/21/13 0212 03/21/13 0844 03/21/13 1352 03/21/13 2215 03/22/13 0230 03/23/13 0415  NA 125* 123* 123* 125* 123* 127*  K 2.8* 3.0* 3.5 3.9 3.8 3.3*  CL 86* 84* 85*  89* 88* 92*  CO2 31 31 31 30 28 28   GLUCOSE 177* 216* 190* 174* 155* 133*  BUN 11 12 12 15 14 11   CREATININE 0.67 0.74 0.85 0.85 0.81 0.72  CALCIUM 8.6 8.6 8.6 8.5 8.4 8.4  MG 1.1*  --   --   --   --   --    Liver Function Tests:  Recent Labs Lab 03/20/13 1255  AST 19  ALT 13  ALKPHOS 46  BILITOT 0.4  PROT 5.7*  ALBUMIN 2.4*    Recent Labs Lab 03/20/13 1255 03/21/13 0212 03/23/13 0415  LIPASE 121* 135* 261*   No results found for this basename: AMMONIA,  in the last 168 hours CBC:  Recent Labs Lab 03/20/13 1255 03/21/13 0212 03/22/13 0230 03/23/13 0415  WBC 9.9 9.9 10.4 9.5  NEUTROABS 7.5  --   --   --   HGB 9.4* 9.4* 8.8* 8.9*  HCT 25.5* 26.4* 24.4* 25.3*  MCV 84.7 85.2 86.8 86.9  PLT 351 354 386 421*   Cardiac Enzymes: No results found for this basename: CKTOTAL, CKMB, CKMBINDEX, TROPONINI,  in the last 168 hours BNP (last 3 results) No results found for this basename: PROBNP,  in the last 8760 hours CBG:  Recent Labs Lab 03/22/13 0757 03/22/13 1136 03/22/13 1753 03/22/13 2203 03/23/13 0800  GLUCAP 161* 163* 154* 185* 172*    Recent Results (from the  past 240 hour(s))  URINE CULTURE     Status: None   Collection Time    03/20/13  2:03 PM      Result Value Range Status   Specimen Description URINE, CLEAN CATCH   Final   Special Requests NONE   Final   Culture  Setup Time     Final   Value: 03/20/2013 20:13     Performed at [place]     Final   Value: 75,000 COLONIES/ML     Performed at [facility]   Culture     Final   Value: Multiple bacterial morphotypes present, none predominant. Suggest appropriate recollection if clinically indicated.     Performed at [facility]   Report Status 03/21/2013 FINAL   Final  MRSA PCR SCREENING     Status: None   Collection Time    03/20/13  8:35 PM      Result Value Range Status   MRSA by PCR NEGATIVE  NEGATIVE Final   Comment:            The GeneXpert  MRSA Assay (FDA     approved for NASAL specimens     only), is one component of a     comprehensive MRSA colonization     surveillance program. It is not     intended to diagnose MRSA     infection nor to guide or     monitor treatment for     MRSA infections.     Studies: US Abdomen Complete  03/22/2013   CLINICAL DATA:  Subsequent evaluation abdominal pain and distention and elevated serum lipase. Surgical history includes cholecystectomy.  EXAM: ULTRASOUND ABDOMEN COMPLETE  COMPARISON:  CT abdomen and pelvis 03/20/2013, 09/12/2012.  FINDINGS: Gallbladder:  Surgically absent.  Common bile duct:  Diameter: 3 mm.  Liver:  Normal size and echotexture without focal parenchymal abnormality. Patent portal vein with hepatopetal flow.  IVC:  Patent.  Pancreas:  Normal-appearing head and the body. Tail obscured by overlying bowel gas. Pancreas was atrophic but otherwise normal in appearance on the CT 2 days ago.  Spleen:  Normal size and echotexture without focal parenchymal abnormality.  Right Kidney:  Length: Approximately 11.2 cm. No hydronephrosis. Approximate 1 cm simple cyst arising from the upper pole. No significant focal parenchymal abnormality. Well-preserved cortex. Normal parenchymal echotexture. No visible shadowing calculi.  Left Kidney:  Length: Approximately 10.6 cm. No hydronephrosis. Approximate 1 cm simple cyst arising from the mid to lower pole. No significant focal parenchymal abnormality. Well-preserved cortex. Normal parenchymal echotexture. No visible shadowing calculi. Marland Kitchen  Abdominal aorta:  Normal in caliber throughout its visualized course in the abdomen with evidence of atherosclerosis. Maximum diameter 2.5 cm.  Other findings:  Bilateral pleural effusions.  IMPRESSION: 1. No acute abnormality post cholecystectomy. Bilateral renal cysts. Pancreatic tail obscured by overlying bowel gas, but pancreas atrophic and otherwise normal in appearance on the CT 2 days ago. 2. Aortic  atherosclerosis without aneurysm, maximum diameter 2.5 cm. 3. Bilateral pleural effusions as noted on the CT.   Electronically Signed   By: Evangeline Dakin M.D.   On: 03/22/2013 09:46    Scheduled Meds: . amLODipine  5 mg Oral BID  . aspirin  81 mg Oral Daily  . atorvastatin  40 mg Oral q1800  . cefTRIAXone (ROCEPHIN)  IV  1 g Intravenous Q24H  . fenofibrate  160 mg Oral Daily  . fluticasone  2 spray Each  Nare Daily  . folic acid  1 mg Oral BID  . heparin  5,000 Units Subcutaneous Q8H  . hydrALAZINE  50 mg Oral BID  . insulin aspart  0-15 Units Subcutaneous TID WC  . insulin aspart  0-5 Units Subcutaneous QHS  . insulin glargine  10 Units Subcutaneous Daily  . lisinopril  40 mg Oral Daily  . metoCLOPramide (REGLAN) injection  5 mg Intravenous Q8H  . pantoprazole (PROTONIX) IV  40 mg Intravenous Q24H  . polyethylene glycol  17 g Oral Daily  . potassium chloride  40 mEq Oral QID  . propranolol  60 mg Oral BID   Continuous Infusions:    Active Problems:   Accelerated hypertension   Diabetes mellitus   Hypokalemia   Hypertension   Constipation   Hyponatremia    Time spent: [place], [place] [facility] Pager [phone]. If 7PM-7AM, please contact night-coverage at [email or web address], password [facility] 03/23/2013, 10:28 AM  LOS: 3 days

## 2013-03-23 NOTE — Progress Notes (Signed)
Physical Therapy Treatment Patient Details Name: Tamara Arnold MRN: [id number] DOB: April 29, 1926 Today's Date: 03/23/2013 Time: [id number] PT Time Calculation (min): 24 min  PT Assessment / Plan / Recommendation  History of Present Illness recent back surger at [facility]; adm from [facility] due to N/V    PT Comments   Pt able to increase ambulation distance with minimal cues for safety.  Pt having difficulty getting in/out of bed using proper technique.    Follow Up Recommendations  SNF;Supervision/Assistance - 24 hour     Frequency Min 3X/week   Progress towards PT Goals Progress towards PT goals: Progressing toward goals  Plan Current plan remains appropriate    Precautions / Restrictions Precautions Precautions: Fall;Back Precaution Booklet Issued: Yes (comment) Precaution Comments: pt able to recall 3/3 back precautions  Required Braces or Orthoses: Spinal Brace Spinal Brace: Lumbar corset;Applied in sitting position Restrictions Weight Bearing Restrictions: No   Pertinent Vitals/Pain No c/o pain    Mobility  Bed Mobility Bed Mobility: Right Sidelying to Sit Right Sidelying to Sit: HOB flat;With rails;3: Mod assist Details for Bed Mobility Assistance: (A) to prevent twisting with max cues for technique.  Pt attempts to go straight back into bed instead of sidelying then log roll. Transfers Transfers: Sit to Stand;Stand to Sit Sit to Stand: 4: Min guard;From chair/3-in-1;With armrests Stand to Sit: 4: Min guard;To bed;With armrests Details for Transfer Assistance: min guard to steady and for safety; cues for hand placement and safety with RW Ambulation/Gait Ambulation/Gait Assistance: 4: Min guard Ambulation Distance (Feet): 200 Feet Assistive device: Rolling walker Ambulation/Gait Assistance Details: Minguard for safety with cues for RW placement and continued cues for safe pace Gait Pattern: Step-through pattern;Wide base of support Gait velocity: pt attempts to amb  at fast pace but is unsafe; cues for safety Stairs: No Wheelchair Mobility Wheelchair Mobility: No    Exercises General Exercises - Lower Extremity Long Arc Quad: AROM;Both;10 reps;Seated   PT Diagnosis:    PT Problem List:   PT Treatment Interventions:     PT Goals (current goals can now be found in the care plan section) Acute Rehab PT Goals Patient Stated Goal: to get back to rehab and finally go home PT Goal Formulation: With patient Time For Goal Achievement: 03/28/13 Potential to Achieve Goals: Good  Visit Information  Last PT Received On: 03/23/13 Assistance Needed: +1 History of Present Illness: recent back surger at [place]; adm from [facility] due to N/V     Subjective Data  Subjective: "I would love to get up and move." Patient Stated Goal: to get back to rehab and finally go home   Cognition  Cognition Arousal/Alertness: Awake/alert Behavior During Therapy: WFL for tasks assessed/performed Overall Cognitive Status: Within Functional Limits for tasks assessed    Balance     End of Session PT - End of Session Equipment Utilized During Treatment: Gait belt;Back brace Activity Tolerance: Patient tolerated treatment well Patient left: in bed;with call bell/phone within reach Nurse Communication: Mobility status   GP     Verona Hartshorn 03/23/2013, 4:38 PM   Antoine Poche, [place] DPT [phone]

## 2013-03-24 ENCOUNTER — Inpatient Hospital Stay (HOSPITAL_COMMUNITY): Payer: Medicare Other

## 2013-03-24 DIAGNOSIS — R0602 Shortness of breath: Secondary | ICD-10-CM

## 2013-03-24 LAB — BASIC METABOLIC PANEL
Calcium: 8.6 mg/dL (ref 8.4–10.5)
Creatinine, Ser: 0.74 mg/dL (ref 0.50–1.10)
GFR calc Af Amer: 87 mL/min — ABNORMAL LOW (ref >90)
GFR calc non Af Amer: 75 mL/min — ABNORMAL LOW (ref >90)

## 2013-03-24 LAB — LIPASE, BLOOD: Lipase: 299 U/L — ABNORMAL HIGH (ref 11–59)

## 2013-03-24 LAB — GLUCOSE, CAPILLARY
Glucose-Capillary: 251 mg/dL — ABNORMAL HIGH (ref 70–99)
Glucose-Capillary: 145 mg/dL — ABNORMAL HIGH (ref 70–99)

## 2013-03-24 LAB — CBC
Platelets: 445 10*3/uL — ABNORMAL HIGH (ref 150–400)
RDW: 12.9 % (ref 11.5–15.5)
WBC: 12.2 10*3/uL — ABNORMAL HIGH (ref 4.0–10.5)

## 2013-03-24 LAB — TROPONIN I: Troponin I: <0.3 ng/mL (ref <0.30)

## 2013-03-24 MED ORDER — FUROSEMIDE 10 MG/ML IJ SOLN
INTRAMUSCULAR | Status: AC
  Filled 2013-03-24: qty 4

## 2013-03-24 MED ORDER — FUROSEMIDE 10 MG/ML IJ SOLN
20.0000 mg | Freq: Once | INTRAMUSCULAR | Status: AC
  Administered 2013-03-24: 20 mg via INTRAVENOUS

## 2013-03-24 MED ORDER — PANTOPRAZOLE SODIUM 40 MG PO TBEC
40.0000 mg | DELAYED_RELEASE_TABLET | Freq: Every day | ORAL | Status: DC
  Administered 2013-03-24 – 2013-03-27 (×4): 40 mg via ORAL
  Filled 2013-03-24 (×2): qty 1

## 2013-03-24 MED ORDER — BISACODYL 10 MG RE SUPP
10.0000 mg | Freq: Once | RECTAL | Status: AC
  Administered 2013-03-24: 10 mg via RECTAL
  Filled 2013-03-24: qty 1

## 2013-03-24 MED ORDER — METOCLOPRAMIDE HCL 5 MG PO TABS
5.0000 mg | ORAL_TABLET | Freq: Three times a day (TID) | ORAL | Status: DC
  Administered 2013-03-24 – 2013-03-27 (×12): 5 mg via ORAL
  Filled 2013-03-24 (×16): qty 1

## 2013-03-24 MED ORDER — FUROSEMIDE 10 MG/ML IJ SOLN
40.0000 mg | Freq: Once | INTRAMUSCULAR | Status: AC
  Administered 2013-03-24: 40 mg via INTRAVENOUS

## 2013-03-24 NOTE — Progress Notes (Signed)
Physical Therapy Treatment Patient Details Name: Tamara Arnold MRN: [id number] DOB: 1925/12/10 Today's Date: 03/24/2013 Time: [id number] PT Time Calculation (min): 24 min  PT Assessment / Plan / Recommendation  History of Present Illness recent back surger at [facility]; adm from [facility] due to N/V    PT Comments   Pt was limited in ambulation today due to pain. Pt rated pain 9/10; RN notified. Pt continues to be highly motivated to return to PLOF to return home. Will cont to follow with pt while in acute setting per POC.   Follow Up Recommendations  SNF;Supervision/Assistance - 24 hour     Does the patient have the potential to tolerate intense rehabilitation     Barriers to Discharge        Equipment Recommendations  Other (comment)    Recommendations for Other Services    Frequency Min 3X/week   Progress towards PT Goals Progress towards PT goals: Progressing toward goals  Plan Current plan remains appropriate    Precautions / Restrictions Precautions Precautions: Fall;Back Precaution Comments: pt able to recall 3/3 back precautions and min cues during session to adhere  Required Braces or Orthoses: Spinal Brace Spinal Brace: Lumbar corset;Applied in sitting position Restrictions Weight Bearing Restrictions: No   Pertinent Vitals/Pain 9/10 RN notified.    Mobility  Bed Mobility Bed Mobility: Not assessed Details for Bed Mobility Assistance: pt sitting in recliner and returned to recliner  Transfers Transfers: Sit to Stand;Stand to Sit Sit to Stand: 4: Min guard;From chair/3-in-1;With armrests Stand to Sit: 4: Min guard;To chair/3-in-1;With armrests Details for Transfer Assistance: min guard to steady and for safety; cues for safe hand placement  Ambulation/Gait Ambulation/Gait Assistance: 4: Min guard Ambulation Distance (Feet): 150 Feet Assistive device: Rolling walker Ambulation/Gait Assistance Details: limited ambulation today due to pain; cues for safety and  RW management; encouraged not to twist when changing directions  Gait Pattern: Step-through pattern;Wide base of support Gait velocity: decreased  Stairs: No Wheelchair Mobility Wheelchair Mobility: No    Exercises General Exercises - Lower Extremity Ankle Circles/Pumps: AROM;Both;10 reps;Seated Long Arc Quad: AROM;Both;10 reps;Seated Hip ABduction/ADduction: AROM;Both;10 reps;Seated Hip Flexion/Marching: AROM;Both;10 reps;Seated   PT Diagnosis:    PT Problem List:   PT Treatment Interventions:     PT Goals (current goals can now be found in the care plan section) Acute Rehab PT Goals Patient Stated Goal: to get stronger  and go home  PT Goal Formulation: With patient Time For Goal Achievement: 03/28/13 Potential to Achieve Goals: Good  Visit Information  Last PT Received On: 03/24/13 Assistance Needed: +1 History of Present Illness: recent back surger at [place]; adm from [facility] due to N/V     Subjective Data  Subjective: "i just dont know how much i can walk. i feel weak today but we can try"  Patient Stated Goal: to get stronger  and go home    Cognition  Cognition Arousal/Alertness: Awake/alert Behavior During Therapy: WFL for tasks assessed/performed Overall Cognitive Status: Within Functional Limits for tasks assessed    Balance  Balance Balance Assessed: No  End of Session PT - End of Session Equipment Utilized During Treatment: Gait belt;Back brace Activity Tolerance: Patient limited by pain Patient left: in chair;with call bell/phone within reach;with family/visitor present Nurse Communication: Mobility status;Patient requests pain meds   GP     Gustavus Bryant, [place] 03/24/2013, 12:54 PM

## 2013-03-24 NOTE — Progress Notes (Signed)
Patient voiding in [facility] this am with one person assistance, patient states shortness of breath has diminished this morning. RN ambulated patient to chair for breakfast. Patient also complaining of tenderness along incision site. Minimal redness noticed along incision line, no drainage noted.   No other complaints from patient this am.   Lasix given, and suppository will be given once patient is back in bed.

## 2013-03-24 NOTE — Progress Notes (Signed)
Tamara Arnold [id number] [birth year]  Brief narrative: 79yo with pmh htn, hyperlipidemia, DMII admitted on 10/30 post-op recent back surgery with constipation, accelerated htn and electrolyte abnormalities. Diuretics held on admit to correct low Na. About 1.5L documented on intake since admit.  Subj: Difficulty breathing. Denies any chest pain.   Obj: Filed Vitals:   03/22/13 2018 03/23/13 0333 03/23/13 1815 03/23/13 1918  BP: 181/59 177/65 166/70 184/49  Pulse: 85 82 81 85  Temp: 98.4 F (36.9 C) 98.2 F (36.8 C) 99.1 F (37.3 C) 97.7 F (36.5 C)  TempSrc: Oral Oral  Oral  Resp: 20 18 18 20   Height: 5\' 2"  (1.575 m)   5\' 2"  (1.575 m)  Weight: 67.9 kg (149 lb 11.1 oz)   66.4 kg (146 lb 6.2 oz)  SpO2: 98% 99% 97% 98%   CBC    Component Value Date/Time   WBC 9.5 03/23/2013 0415   RBC 2.91* 03/23/2013 0415   HGB 8.9* 03/23/2013 0415   HCT 25.3* 03/23/2013 0415   PLT 421* 03/23/2013 0415   MCV 86.9 03/23/2013 0415   MCH 30.6 03/23/2013 0415   MCHC 35.2 03/23/2013 0415   RDW 13.1 03/23/2013 0415   LYMPHSABS 1.1 03/20/2013 1255   MONOABS 1.3* 03/20/2013 1255   EOSABS 0.1 03/20/2013 1255   BASOSABS 0.0 03/20/2013 1255   BMET    Component Value Date/Time   NA 127* 03/23/2013 0415   K 3.3* 03/23/2013 0415   CL 92* 03/23/2013 0415   CO2 28 03/23/2013 0415   GLUCOSE 133* 03/23/2013 0415   BUN 11 03/23/2013 0415   CREATININE 0.72 03/23/2013 0415   CALCIUM 8.4 03/23/2013 0415   GFRNONAA 76* 03/23/2013 0415   GFRAA 88* 03/23/2013 0415   General: awake, alert elderly female in NAD CV: S1S2, RRR Resp: mild bibasilar crackles, decreased inspiratory effort, no increased wob GI: abdomen soft, NT/ND, BS+ Ext: 2+ pitting edema to midshin R>L  A/P:  1. Volume overload: No echo on file. Will give Lasix, check chest xray, bnp, cardiac enzymes. Monitor  [name], NP-C [facility] [place]  pgr [phone]

## 2013-03-24 NOTE — Progress Notes (Signed)
[facility] PROGRESS NOTE  Tamara Arnold [id number] DOB: [birth year] DOA: 03/20/2013 PCP: Stephens Shire, MD  Assessment/Plan: N/V/abd pain- prob constipation coupled with slow bowel after surgery- reglan, eating well but bowels not moving well, suppository and miralax for constipation  Acute CHF exacerbation -IV lasix Check echo  Elevated lipase-  RUQ ok- GB out in 2003; CT scan did not show pancreatitis- trend  Hypokalemia- replete,  Mg low- replete  Hyponatremia- fluid restrict as patient drinks a lot of water daily-IV lasix, hold HCTZ, urine osmos low, serum osmos low, TSH ok  DM- SSI and monitor  HTN- resume home meds and adjust as needed  Polypharmacy- try to remove from [facility] as many meds as possible  Recent surgery- no fevers, PT Eval to ambulate patient   Leukocytosis  -trend   Code Status: full Family Communication: patient Disposition Plan: back to SNF 1-2 days   Consultants:  none  Procedures:  none  Antibiotics:    HPI/Subjective: Had some SOB yesterday abd still feels full   Objective: Filed Vitals:   03/24/13 0500  BP: 171/72  Pulse: 78  Temp: 98.5 F (36.9 C)  Resp: 20    Intake/Output Summary (Last 24 hours) at 03/24/13 0844 Last data filed at 03/24/13 0816  Gross per 24 hour  Intake   1080 ml  Output   2477 ml  Net  -1397 ml   Filed Weights   03/21/13 2141 03/22/13 2018 03/23/13 1918  Weight: 66.5 kg (146 lb 9.7 oz) 67.9 kg (149 lb 11.1 oz) 66.4 kg (146 lb 6.2 oz)    Exam:  General: A+Ox3, NAD- pleasant   Cardiovascular: rrr  Respiratory: decreased at bases  Abdomen: decreased bowel sounds, soft, non tender  Skin: no rashes or lesions  Musculoskeletal: moves all 4 ext     Data Reviewed: Basic Metabolic Panel:  Recent Labs Lab 03/21/13 0212  03/21/13 1352 03/21/13 2215 03/22/13 0230 03/23/13 0415 03/24/13 0130  NA 125*  < > 123* 125* 123* 127* 127*  K 2.8*  < > 3.5 3.9 3.8 3.3* 4.3  CL 86*  <  > 85* 89* 88* 92* 92*  CO2 31  < > 31 30 28 28 27   GLUCOSE 177*  < > 190* 174* 155* 133* 135*  BUN 11  < > 12 15 14 11 12   CREATININE 0.67  < > 0.85 0.85 0.81 0.72 0.74  CALCIUM 8.6  < > 8.6 8.5 8.4 8.4 8.6  MG 1.1*  --   --   --   --   --   --   < > = values in this interval not displayed. Liver Function Tests:  Recent Labs Lab 03/20/13 1255  AST 19  ALT 13  ALKPHOS 46  BILITOT 0.4  PROT 5.7*  ALBUMIN 2.4*    Recent Labs Lab 03/20/13 1255 03/21/13 0212 03/23/13 0415 03/24/13 0130  LIPASE 121* 135* 261* 299*   No results found for this basename: AMMONIA,  in the last 168 hours CBC:  Recent Labs Lab 03/20/13 1255 03/21/13 0212 03/22/13 0230 03/23/13 0415 03/24/13 0130  WBC 9.9 9.9 10.4 9.5 12.2*  NEUTROABS 7.5  --   --   --   --   HGB 9.4* 9.4* 8.8* 8.9* 9.3*  HCT 25.5* 26.4* 24.4* 25.3* 25.6*  MCV 84.7 85.2 86.8 86.9 85.9  PLT 351 354 386 421* 445*   Cardiac Enzymes:  Recent Labs Lab 03/24/13 0135  TROPONINI <0.30   BNP (last 3  results)  Recent Labs  03/24/13 0135  PROBNP 14726.0*   CBG:  Recent Labs Lab 03/22/13 2203 03/23/13 0800 03/23/13 1142 03/23/13 1652 03/23/13 2224  GLUCAP 185* 172* 185* 162* 156*    Recent Results (from the past 240 hour(s))  URINE CULTURE     Status: None   Collection Time    03/20/13  2:03 PM      Result Value Range Status   Specimen Description URINE, CLEAN CATCH   Final   Special Requests NONE   Final   Culture  Setup Time     Final   Value: 03/20/2013 20:13     Performed at [place]     Final   Value: 75,000 COLONIES/ML     Performed at [facility]   Culture     Final   Value: Multiple bacterial morphotypes present, none predominant. Suggest appropriate recollection if clinically indicated.     Performed at [facility]   Report Status 03/21/2013 FINAL   Final  MRSA PCR SCREENING     Status: None   Collection Time    03/20/13  8:35 PM      Result Value  Range Status   MRSA by PCR NEGATIVE  NEGATIVE Final   Comment:            The GeneXpert MRSA Assay (FDA     approved for NASAL specimens     only), is one component of a     comprehensive MRSA colonization     surveillance program. It is not     intended to diagnose MRSA     infection nor to guide or     monitor treatment for     MRSA infections.     Studies: US Abdomen Complete  03/22/2013   CLINICAL DATA:  Subsequent evaluation abdominal pain and distention and elevated serum lipase. Surgical history includes cholecystectomy.  EXAM: ULTRASOUND ABDOMEN COMPLETE  COMPARISON:  CT abdomen and pelvis 03/20/2013, 09/12/2012.  FINDINGS: Gallbladder:  Surgically absent.  Common bile duct:  Diameter: 3 mm.  Liver:  Normal size and echotexture without focal parenchymal abnormality. Patent portal vein with hepatopetal flow.  IVC:  Patent.  Pancreas:  Normal-appearing head and the body. Tail obscured by overlying bowel gas. Pancreas was atrophic but otherwise normal in appearance on the CT 2 days ago.  Spleen:  Normal size and echotexture without focal parenchymal abnormality.  Right Kidney:  Length: Approximately 11.2 cm. No hydronephrosis. Approximate 1 cm simple cyst arising from the upper pole. No significant focal parenchymal abnormality. Well-preserved cortex. Normal parenchymal echotexture. No visible shadowing calculi.  Left Kidney:  Length: Approximately 10.6 cm. No hydronephrosis. Approximate 1 cm simple cyst arising from the mid to lower pole. No significant focal parenchymal abnormality. Well-preserved cortex. Normal parenchymal echotexture. No visible shadowing calculi. Marland Kitchen  Abdominal aorta:  Normal in caliber throughout its visualized course in the abdomen with evidence of atherosclerosis. Maximum diameter 2.5 cm.  Other findings:  Bilateral pleural effusions.  IMPRESSION: 1. No acute abnormality post cholecystectomy. Bilateral renal cysts. Pancreatic tail obscured by overlying bowel gas, but  pancreas atrophic and otherwise normal in appearance on the CT 2 days ago. 2. Aortic atherosclerosis without aneurysm, maximum diameter 2.5 cm. 3. Bilateral pleural effusions as noted on the CT.   Electronically Signed   By: Evangeline Dakin M.D.   On: 03/22/2013 09:46   Dg Chest Port 1 View  03/24/2013   CLINICAL DATA:  79 year old female with shortness of breath and abdominal distention.  EXAM: PORTABLE CHEST - 1 VIEW  COMPARISON:  09/12/2012 and earlier.  FINDINGS: Portable AP upright view at 0115 hrs. Small bilateral pleural effusions persists. No pneumothorax or edema. No consolidation or other confluent pulmonary opacity. Stable cardiac size and mediastinal contours. Partially visible lumbar spinal fusion hardware.  IMPRESSION: Continued small bilateral pleural effusions. No other acute cardiopulmonary abnormality.   Electronically Signed   By: Lars Pinks M.D.   On: 03/24/2013 01:26    Scheduled Meds: . amLODipine  10 mg Oral BID  . aspirin  81 mg Oral Daily  . atorvastatin  40 mg Oral q1800  . bisacodyl  10 mg Rectal Once  . cefTRIAXone (ROCEPHIN)  IV  1 g Intravenous Q24H  . fenofibrate  160 mg Oral Daily  . fluticasone  2 spray Each Nare Daily  . folic acid  1 mg Oral BID  . furosemide  40 mg Intravenous Once  . heparin  5,000 Units Subcutaneous Q8H  . hydrALAZINE  100 mg Oral BID  . insulin aspart  0-15 Units Subcutaneous TID WC  . insulin aspart  0-5 Units Subcutaneous QHS  . insulin glargine  10 Units Subcutaneous Daily  . metoCLOPramide  5 mg Oral TID AC & HS  . pantoprazole  40 mg Oral Daily  . polyethylene glycol  17 g Oral Daily  . propranolol  60 mg Oral BID   Continuous Infusions:    Active Problems:   Accelerated hypertension   Diabetes mellitus   Hypokalemia   Hypertension   Constipation   Hyponatremia    Time spent: [place], [place] [facility] Pager [phone]. If 7PM-7AM, please contact night-coverage at [email or web address], password  [facility] 03/24/2013, 8:44 AM  LOS: 4 days

## 2013-03-25 DIAGNOSIS — I359 Nonrheumatic aortic valve disorder, unspecified: Secondary | ICD-10-CM

## 2013-03-25 LAB — GLUCOSE, CAPILLARY
Glucose-Capillary: 178 mg/dL — ABNORMAL HIGH (ref 70–99)
Glucose-Capillary: 158 mg/dL — ABNORMAL HIGH (ref 70–99)
Glucose-Capillary: 220 mg/dL — ABNORMAL HIGH (ref 70–99)
Glucose-Capillary: 231 mg/dL — ABNORMAL HIGH (ref 70–99)
Glucose-Capillary: 179 mg/dL — ABNORMAL HIGH (ref 70–99)

## 2013-03-25 LAB — BASIC METABOLIC PANEL
CO2: 29 mEq/L (ref 19–32)
Chloride: 89 mEq/L — ABNORMAL LOW (ref 96–112)
Creatinine, Ser: 0.83 mg/dL (ref 0.50–1.10)
GFR calc Af Amer: 72 mL/min — ABNORMAL LOW (ref >90)
GFR calc non Af Amer: 63 mL/min — ABNORMAL LOW (ref >90)
Potassium: 3.6 mEq/L (ref 3.5–5.1)

## 2013-03-25 LAB — CBC
MCV: 86.3 fL (ref 78.0–100.0)
Platelets: 411 10*3/uL — ABNORMAL HIGH (ref 150–400)
RBC: 2.7 MIL/uL — ABNORMAL LOW (ref 3.87–5.11)
RDW: 13.5 % (ref 11.5–15.5)
WBC: 9 10*3/uL (ref 4.0–10.5)

## 2013-03-25 MED ORDER — INSULIN GLARGINE 100 UNIT/ML ~~LOC~~ SOLN
12.0000 [IU] | Freq: Every day | SUBCUTANEOUS | Status: DC
  Administered 2013-03-26 – 2013-03-27 (×2): 12 [IU] via SUBCUTANEOUS
  Filled 2013-03-25 (×2): qty 0.12

## 2013-03-25 NOTE — Progress Notes (Signed)
[facility] PROGRESS NOTE  Tamara Arnold [id number] DOB: Oct 12, 1925 DOA: 03/20/2013 PCP: Stephens Shire, MD  Assessment/Plan: N/V/abd pain- prob constipation coupled with slow bowel after surgery- reglan, eating well but bowels not moving well, suppository and miralax for constipation-bowels moving slowely  Acute CHF exacerbation -IV lasix Check echo  Elevated lipase-  RUQ ok- GB out in 2003; no nausea/abd pain; CT scan did not show pancreatitis- trend  Hypokalemia- replete,  Mg low- replete  Hyponatremia- fluid restrict as patient drinks a lot of water daily-IV lasix, hold HCTZ, urine osmos low, serum osmos low, TSH ok  DM- SSI and monitor  HTN- resume home meds and adjust as needed  Polypharmacy- try to remove from [facility] as many meds as possible  Recent surgery- no fevers, PT Eval to ambulate patient   Leukocytosis  -trend   Code Status: full Family Communication: patient Disposition Plan: back to SNF in AM   Consultants:  none  Procedures:  none  Antibiotics:    HPI/Subjective: SOB improved +BM  Objective: Filed Vitals:   03/25/13 1000  BP: 148/51  Pulse: 79  Temp: 98.8 F (37.1 C)  Resp: 18    Intake/Output Summary (Last 24 hours) at 03/25/13 1214 Last data filed at 03/25/13 0900  Gross per 24 hour  Intake    480 ml  Output    401 ml  Net     79 ml   Filed Weights   03/21/13 2141 03/22/13 2018 03/23/13 1918  Weight: 66.5 kg (146 lb 9.7 oz) 67.9 kg (149 lb 11.1 oz) 66.4 kg (146 lb 6.2 oz)    Exam:  General: A+Ox3, NAD- pleasant   Cardiovascular: rrr  Respiratory: decreased at bases  Abdomen: decreased bowel sounds, soft, non tender  Skin: no rashes or lesions; mild edema Musculoskeletal: moves all 4 ext     Data Reviewed: Basic Metabolic Panel:  Recent Labs Lab 03/21/13 0212  03/21/13 2215 03/22/13 0230 03/23/13 0415 03/24/13 0130 03/25/13 0445  NA 125*  < > 125* 123* 127* 127* 125*  K 2.8*  < > 3.9 3.8 3.3*  4.3 3.6  CL 86*  < > 89* 88* 92* 92* 89*  CO2 31  < > 30 28 28 27 29   GLUCOSE 177*  < > 174* 155* 133* 135* 149*  BUN 11  < > 15 14 11 12 14   CREATININE 0.67  < > 0.85 0.81 0.72 0.74 0.83  CALCIUM 8.6  < > 8.5 8.4 8.4 8.6 8.3*  MG 1.1*  --   --   --   --   --   --   < > = values in this interval not displayed. Liver Function Tests:  Recent Labs Lab 03/20/13 1255  AST 19  ALT 13  ALKPHOS 46  BILITOT 0.4  PROT 5.7*  ALBUMIN 2.4*    Recent Labs Lab 03/20/13 1255 03/21/13 0212 03/23/13 0415 03/24/13 0130  LIPASE 121* 135* 261* 299*   No results found for this basename: AMMONIA,  in the last 168 hours CBC:  Recent Labs Lab 03/20/13 1255 03/21/13 0212 03/22/13 0230 03/23/13 0415 03/24/13 0130 03/25/13 0445  WBC 9.9 9.9 10.4 9.5 12.2* 9.0  NEUTROABS 7.5  --   --   --   --   --   HGB 9.4* 9.4* 8.8* 8.9* 9.3* 8.5*  HCT 25.5* 26.4* 24.4* 25.3* 25.6* 23.3*  MCV 84.7 85.2 86.8 86.9 85.9 86.3  PLT 351 354 386 421* 445* 411*   Cardiac  Enzymes:  Recent Labs Lab 03/24/13 0135  TROPONINI <0.30   BNP (last 3 results)  Recent Labs  03/24/13 0135  PROBNP 14726.0*   CBG:  Recent Labs Lab 03/24/13 1152 03/24/13 1703 03/24/13 2134 03/25/13 0736 03/25/13 1133  GLUCAP 251* 145* 178* 158* 220*    Recent Results (from the past 240 hour(s))  URINE CULTURE     Status: None   Collection Time    03/20/13  2:03 PM      Result Value Range Status   Specimen Description URINE, CLEAN CATCH   Final   Special Requests NONE   Final   Culture  Setup Time     Final   Value: 03/20/2013 20:13     Performed at [place]     Final   Value: 75,000 COLONIES/ML     Performed at [facility]   Culture     Final   Value: Multiple bacterial morphotypes present, none predominant. Suggest appropriate recollection if clinically indicated.     Performed at [facility]   Report Status 03/21/2013 FINAL   Final  MRSA PCR SCREENING      Status: None   Collection Time    03/20/13  8:35 PM      Result Value Range Status   MRSA by PCR NEGATIVE  NEGATIVE Final   Comment:            The GeneXpert MRSA Assay (FDA     approved for NASAL specimens     only), is one component of a     comprehensive MRSA colonization     surveillance program. It is not     intended to diagnose MRSA     infection nor to guide or     monitor treatment for     MRSA infections.     Studies: Dg Chest Port 1 View  03/24/2013   CLINICAL DATA:  79 year old female with shortness of breath and abdominal distention.  EXAM: PORTABLE CHEST - 1 VIEW  COMPARISON:  09/12/2012 and earlier.  FINDINGS: Portable AP upright view at 0115 hrs. Small bilateral pleural effusions persists. No pneumothorax or edema. No consolidation or other confluent pulmonary opacity. Stable cardiac size and mediastinal contours. Partially visible lumbar spinal fusion hardware.  IMPRESSION: Continued small bilateral pleural effusions. No other acute cardiopulmonary abnormality.   Electronically Signed   By: Lars Pinks M.D.   On: 03/24/2013 01:26    Scheduled Meds: . amLODipine  10 mg Oral BID  . aspirin  81 mg Oral Daily  . atorvastatin  40 mg Oral q1800  . cefTRIAXone (ROCEPHIN)  IV  1 g Intravenous Q24H  . fenofibrate  160 mg Oral Daily  . fluticasone  2 spray Each Nare Daily  . folic acid  1 mg Oral BID  . heparin  5,000 Units Subcutaneous Q8H  . hydrALAZINE  100 mg Oral BID  . insulin aspart  0-15 Units Subcutaneous TID WC  . insulin aspart  0-5 Units Subcutaneous QHS  . insulin glargine  10 Units Subcutaneous Daily  . metoCLOPramide  5 mg Oral TID AC & HS  . pantoprazole  40 mg Oral Daily  . polyethylene glycol  17 g Oral Daily  . propranolol  60 mg Oral BID   Continuous Infusions:    Active Problems:   Accelerated hypertension   Diabetes mellitus   Hypokalemia   Hypertension   Constipation   Hyponatremia    Time  spent: [place], [place]  [facility] Pager [phone]. If 7PM-7AM, please contact night-coverage at [email or web address], password [facility] 03/25/2013, 12:14 PM  LOS: 5 days

## 2013-03-25 NOTE — Clinical Social Work Psychosocial (Addendum)
Clinical Social Work Department BRIEF PSYCHOSOCIAL ASSESSMENT 03/25/2013  Patient:  Tamara Arnold, Tamara Arnold     Account Number:  [id number]     Admit date:  03/20/2013  Clinical Social Worker:  Frederico Hamman  Date/Time:  03/25/2013 01:27 AM  Referred by:  Physician  Date Referred:  03/24/2013 Referred for  SNF Placement   Other Referral:   Interview type:  Patient Other interview type:   CSW also talked with husband, Tamara Arnold, who was at the bedside.    PSYCHOSOCIAL DATA Living Status:  FACILITY Admitted from facility:  [facility] Level of care:  [place] Primary support name:  Tamara Arnold Primary support relationship to patient:  SPOUSE Degree of support available:   Good support - husband was at the bedside.    CURRENT CONCERNS Current Concerns  Post-Acute Placement   Other Concerns:   Patient advised MD that she did not want to return to facility and her preference is [facility].    SOCIAL WORK ASSESSMENT / PLAN CSW visited room and talked with patient and husband, Tamara Arnold. Wife explained that the care at [facility] was good, but she could not eat the food. After some discussion, patient and wife decided on the following facilities: [facility], [facility] in [place] and [facility] in [place]. CSW advised patient and husband that they will be kept updated on facility responses.   Assessment/plan status:  Psychosocial Support/Ongoing Assessment of Needs Other assessment/ plan:   Information/referral to community resources:    PATIENT'S/FAMILY'S RESPONSE TO PLAN OF CARE: Patient and husband receptive to talking with CSW about other facility choices.

## 2013-03-25 NOTE — Progress Notes (Signed)
Utilization review completed.  

## 2013-03-25 NOTE — Progress Notes (Signed)
OT Cancellation Note  Patient Details Name: Tamara Arnold MRN: [id number] DOB: [birth year]   Cancelled Treatment:    Reason Eval/Treat Not Completed: Patient declined, stating that she felt dizzy and nauseous and was not up for therapy this afternoon. Will continue to follow    Britt Bottom 03/25/2013, 3:04 PM

## 2013-03-25 NOTE — Clinical Social Work Placement (Addendum)
Clinical Social Work Department CLINICAL SOCIAL WORK PLACEMENT NOTE 03/25/2013  Patient:  Tamara Arnold, Tamara Arnold  Account Number:  [id number] Admit date:  03/20/2013  Clinical Social Worker:  Jorma Tassinari Givens, LCSW  Date/time:  03/25/2013 01:35 AM  Clinical Social Work is seeking post-discharge placement for this patient at the following level of care:   SKILLED NURSING   (*CSW will update this form in Epic as items are completed)     Patient/family provided with [place] Department of Clinical Social Work's list of facilities offering this level of care within the geographic area requested by the patient (or if unable, by the patient's family).  03/25/2013  Patient/family informed of their freedom to choose among providers that offer the needed level of care, that participate in Medicare, Medicaid or managed care program needed by the patient, have an available bed and are willing to accept the patient.    Patient/family informed of [facility]' ownership interest in [facility], as well as of the fact that they are under no obligation to receive care at this facility.  PASARR submitted to EDS on  PASARR number received from [place] on   FL2 transmitted to all facilities in geographic area requested by pt/family on  03/25/2013 ([place]) FL2 transmitted to all facilities within larger geographic area on 03/25/2013 ([facility] in [place] [facility]) and [facility] in [place].  Patient informed that his/her managed care company has contracts with or will negotiate with  certain facilities, including the following:     Patient/family informed of bed offers received:  03/25/2013 Patient chooses bed at [facility] [place] Physician recommends and patient chooses bed at    Patient to be transferred to [facility] on 03/27/13  Patient to be transferred to facility by ambulance  The following physician request were entered in  Epic:   Additional Comments: 03/25/13 - CSW was contacted by admissions director at [facility] and advised that they do not contract with patient's insurance. Patient clinicals also sent to [facility] in case other facilities decline patient. 03/26/13 - CSW contacted [employer] to get authorization for today's discharge, but was advised that case manager will contact CSW when she is able to process paperwork. CSW later advised that patient was advised by MD that she would not discharge today. Facility contacted and updated.

## 2013-03-26 DIAGNOSIS — E860 Dehydration: Secondary | ICD-10-CM

## 2013-03-26 LAB — GLUCOSE, CAPILLARY
Glucose-Capillary: 143 mg/dL — ABNORMAL HIGH (ref 70–99)
Glucose-Capillary: 154 mg/dL — ABNORMAL HIGH (ref 70–99)
Glucose-Capillary: 186 mg/dL — ABNORMAL HIGH (ref 70–99)

## 2013-03-26 MED ORDER — MILK AND MOLASSES ENEMA
Freq: Once | RECTAL | Status: AC
  Administered 2013-03-26: 13:00:00 via RECTAL
  Filled 2013-03-26: qty 250

## 2013-03-26 NOTE — Progress Notes (Signed)
[facility] PROGRESS NOTE  Tamara Arnold [id number] DOB: February 11, 1926 DOA: 03/20/2013 PCP: Stephens Shire, MD  Assessment/Plan 1. N/V/abd pain- given lack of results from suppository and MiraLax, she was given a milk and molasses enema today, which resulted in a large bowel movement.   2. Acute diastolic CHF exacerbation-status post transthoracic echocardiogram performed on 03/25/2013 showing estimated ejection fraction of 60-65%. Echo report also noted abnormal relaxation with increased filling pressures.  3. Hyponatremia- sodium levels fluctuate between 123 and 127. Currently being fluid restricted.  4. DM- SSI and monitor     Code Status: Full code Family Communication: Plan discussed with patient and husband present at bedside Disposition Plan: Plan to transfer to skilled nursing facility tomorrow if she remains stable    Procedures:  Transthoracic echocardiogram performed on 03/25/2013 showing EF of 60-65% with abnormal relaxation and increase filling pressures.  Antibiotics: Ceftriaxone HPI/Subjective: Patient states not having a bowel movement, having ongoing nausea as well as abdominal distention. She was administered note molasses enema which resulted in a large bowel movement towards the mid afternoon.  Objective: Filed Vitals:   03/26/13 1654  BP: 161/47  Pulse: 78  Temp: 98.5 F (36.9 C)  Resp: 17    Intake/Output Summary (Last 24 hours) at 03/26/13 1657 Last data filed at 03/26/13 1319  Gross per 24 hour  Intake    660 ml  Output    750 ml  Net    -90 ml   Filed Weights   03/22/13 2018 03/23/13 1918 03/25/13 2121  Weight: 67.9 kg (149 lb 11.1 oz) 66.4 kg (146 lb 6.2 oz) 65.726 kg (144 lb 14.4 oz)    Exam:  General: A+Ox3, NAD- pleasant  Cardiovascular: rrr  Respiratory: decreased at bases  Abdomen: decreased bowel sounds, soft, non tender  Skin: no rashes or lesions; mild edema  Musculoskeletal: moves all 4 ext    Data  Reviewed: Basic Metabolic Panel:  Recent Labs Lab 03/21/13 0212  03/21/13 2215 03/22/13 0230 03/23/13 0415 03/24/13 0130 03/25/13 0445  NA 125*  < > 125* 123* 127* 127* 125*  K 2.8*  < > 3.9 3.8 3.3* 4.3 3.6  CL 86*  < > 89* 88* 92* 92* 89*  CO2 31  < > 30 28 28 27 29   GLUCOSE 177*  < > 174* 155* 133* 135* 149*  BUN 11  < > 15 14 11 12 14   CREATININE 0.67  < > 0.85 0.81 0.72 0.74 0.83  CALCIUM 8.6  < > 8.5 8.4 8.4 8.6 8.3*  MG 1.1*  --   --   --   --   --   --   < > = values in this interval not displayed. Liver Function Tests:  Recent Labs Lab 03/20/13 1255  AST 19  ALT 13  ALKPHOS 46  BILITOT 0.4  PROT 5.7*  ALBUMIN 2.4*    Recent Labs Lab 03/20/13 1255 03/21/13 0212 03/23/13 0415 03/24/13 0130  LIPASE 121* 135* 261* 299*   No results found for this basename: AMMONIA,  in the last 168 hours CBC:  Recent Labs Lab 03/20/13 1255 03/21/13 0212 03/22/13 0230 03/23/13 0415 03/24/13 0130 03/25/13 0445  WBC 9.9 9.9 10.4 9.5 12.2* 9.0  NEUTROABS 7.5  --   --   --   --   --   HGB 9.4* 9.4* 8.8* 8.9* 9.3* 8.5*  HCT 25.5* 26.4* 24.4* 25.3* 25.6* 23.3*  MCV 84.7 85.2 86.8 86.9 85.9 86.3  PLT 351 354  386 421* 445* 411*   Cardiac Enzymes:  Recent Labs Lab 03/24/13 0135  TROPONINI <0.30   BNP (last 3 results)  Recent Labs  03/24/13 0135  PROBNP 14726.0*   CBG:  Recent Labs Lab 03/25/13 1133 03/25/13 1629 03/25/13 2118 03/26/13 0741 03/26/13 1122  GLUCAP 220* 231* 179* 143* 267*    Recent Results (from the past 240 hour(s))  URINE CULTURE     Status: None   Collection Time    03/20/13  2:03 PM      Result Value Range Status   Specimen Description URINE, CLEAN CATCH   Final   Special Requests NONE   Final   Culture  Setup Time     Final   Value: 03/20/2013 20:13     Performed at [facility]     Final   Value: 75,000 COLONIES/ML     Performed at [facility]   Culture     Final   Value: Multiple  bacterial morphotypes present, none predominant. Suggest appropriate recollection if clinically indicated.     Performed at [facility]   Report Status 03/21/2013 FINAL   Final  MRSA PCR SCREENING     Status: None   Collection Time    03/20/13  8:35 PM      Result Value Range Status   MRSA by PCR NEGATIVE  NEGATIVE Final   Comment:            The GeneXpert MRSA Assay (FDA     approved for NASAL specimens     only), is one component of a     comprehensive MRSA colonization     surveillance program. It is not     intended to diagnose MRSA     infection nor to guide or     monitor treatment for     MRSA infections.     Studies: No results found.  Scheduled Meds: . amLODipine  10 mg Oral BID  . aspirin  81 mg Oral Daily  . atorvastatin  40 mg Oral q1800  . cefTRIAXone (ROCEPHIN)  IV  1 g Intravenous Q24H  . fenofibrate  160 mg Oral Daily  . fluticasone  2 spray Each Nare Daily  . folic acid  1 mg Oral BID  . heparin  5,000 Units Subcutaneous Q8H  . hydrALAZINE  100 mg Oral BID  . insulin aspart  0-15 Units Subcutaneous TID WC  . insulin aspart  0-5 Units Subcutaneous QHS  . insulin glargine  12 Units Subcutaneous Daily  . metoCLOPramide  5 mg Oral TID AC & HS  . pantoprazole  40 mg Oral Daily  . polyethylene glycol  17 g Oral Daily  . propranolol  60 mg Oral BID   Continuous Infusions:   Active Problems:   Accelerated hypertension   Diabetes mellitus   Hypokalemia   Hypertension   Constipation   Hyponatremia    Time spent: 35 minutes    Kelvin Cellar  [facility] Pager [phone]. If 7PM-7AM, please contact night-coverage at [email or web address], password [facility] 03/26/2013, 4:57 PM  LOS: 6 days

## 2013-03-26 NOTE — Progress Notes (Signed)
Physical Therapy Treatment Patient Details Name: Tamara Arnold MRN: [id number] DOB: November 06, 1925 Today's Date: 03/26/2013 Time: [id number] PT Time Calculation (min): 26 min  PT Assessment / Plan / Recommendation  History of Present Illness recent back surger at [facility]; adm from [facility] due to N/V    PT Comments   Pt continues to c/o headache 10/10 and Rt knee pain 7/10. Pt concerned due to lack of BM in multiple days. Pt c/o feeling 'bloated'. Pt continues to be highly motivated to ambulate and participate in LE exercises to improve overall strength. Pt demo LOB today and requried (A) to maintain balance; pt was reaching out of BOS and became unsteady. Pt awaiting bed for ST SNF placement; will cont to follow per POC.   Follow Up Recommendations  SNF;Supervision/Assistance - 24 hour     Does the patient have the potential to tolerate intense rehabilitation     Barriers to Discharge        Equipment Recommendations  Other (comment)    Recommendations for Other Services    Frequency Min 3X/week   Progress towards PT Goals Progress towards PT goals: Progressing toward goals  Plan Current plan remains appropriate    Precautions / Restrictions Precautions Precautions: Fall;Back Precaution Comments: pt able to recall 3/3 back precautions and min cues during session to adhere  Required Braces or Orthoses: Spinal Brace Spinal Brace: Lumbar corset;Applied in sitting position Restrictions Weight Bearing Restrictions: No   Pertinent Vitals/Pain 10/10 headache; 7/10 Rt knee pain     Mobility  Bed Mobility Bed Mobility: Not assessed Details for Bed Mobility Assistance: pt sitting in recliner and returned to recliner  Transfers Transfers: Sit to Stand;Stand to Sit Sit to Stand: 4: Min guard;From chair/3-in-1;From bed;With upper extremity assist Stand to Sit: 4: Min guard;To chair/3-in-1;With armrests;To toilet;With upper extremity assist Details for Transfer Assistance: pt requires  handrail and UE support to guide/control descent to toilet and chair; pt with LEs buckling and unsteady secondary to fatigue; cues for hand placement and safety  Ambulation/Gait Ambulation/Gait Assistance: 4: Min guard;4: Min assist Ambulation Distance (Feet): 170 Feet Assistive device: Rolling walker Ambulation/Gait Assistance Details: min guard to steady and for safety; pt demo 1 LOB when returning to chair; pt was reaching out of BOS to required (A) with gt belt to regain balance; cues for safe management of RW  Gait Pattern: Step-through pattern;Wide base of support Gait velocity: decreased Stairs: No Wheelchair Mobility Wheelchair Mobility: No    Exercises General Exercises - Lower Extremity Ankle Circles/Pumps: AROM;Both;10 reps;Seated Short Arc Quad: Both;AROM;10 reps;Seated (cues to control muscle contraction) Long Arc Quad: AROM;Both;10 reps;Seated Hip ABduction/ADduction: AROM;Both;10 reps;Seated Hip Flexion/Marching: AROM;Both;10 reps;Seated   PT Diagnosis:    PT Problem List:   PT Treatment Interventions:     PT Goals (current goals can now be found in the care plan section) Acute Rehab PT Goals Patient Stated Goal: to get stronger  and go home  PT Goal Formulation: With patient Time For Goal Achievement: 03/28/13 Potential to Achieve Goals: Good  Visit Information  Last PT Received On: 03/26/13 Assistance Needed: +1 History of Present Illness: recent back surger at [place]; adm from [facility] due to N/V     Subjective Data  Subjective: "i just feel miserable. i havent been able to have a bowel movement in a long long time. Nobody came to walk with me yesterday so im ready to go"  Patient Stated Goal: to get stronger  and go home  Cognition  Cognition Arousal/Alertness: Awake/alert Behavior During Therapy: WFL for tasks assessed/performed Overall Cognitive Status: Within Functional Limits for tasks assessed    Balance  Balance Balance Assessed:  Yes Dynamic Standing Balance Dynamic Standing - Balance Support: Left upper extremity supported;During functional activity Dynamic Standing - Balance Activities: Lateral lean/weight shifting;Reaching for objects Dynamic Standing - Comments: pt was reaching down and out of BOS for washcloth and demo LOB; required (A) to regain balance and support from gt belt   End of Session PT - End of Session Equipment Utilized During Treatment: Gait belt;Back brace Activity Tolerance: Patient limited by pain Patient left: in chair;with call bell/phone within reach;with family/visitor present Nurse Communication: Mobility status;Patient requests pain meds   GP     Gustavus Bryant, [place] 03/26/2013, 10:34 AM

## 2013-03-26 NOTE — Progress Notes (Signed)
Inpatient Diabetes Program Recommendations  AACE/ADA: New Consensus Statement on Inpatient Glycemic Control (2013)  Target Ranges:  Prepandial:   less than 140 mg/dL      Peak postprandial:   less than 180 mg/dL (1-2 hours)      Critically ill patients:  140 - 180 mg/dL     Results for BRENDA, NOONEY (MRN [id number]) as of 03/26/2013 13:11  Ref. Range 03/25/2013 07:36 03/25/2013 11:33 03/25/2013 16:29 03/25/2013 21:18  Glucose-Capillary Latest Range: 70-99 mg/dL 158 (H) 220 (H) 231 (H) 179 (H)    Results for NAZARET, ARRELLANO (MRN [id number]) as of 03/26/2013 13:11  Ref. Range 03/26/2013 07:41 03/26/2013 11:22  Glucose-Capillary Latest Range: 70-99 mg/dL 143 (H) 267 (H)    **Patient having elevated postprandial CBGS.  **MD- Please consider adding Novolog meal coverage- Novolog 4 units tid with meals   Will follow. Wyn Quaker RN, MSN, CDE Diabetes Coordinator Inpatient Diabetes Program Team Pager: [phone] (8a-10p)

## 2013-03-27 DIAGNOSIS — R51 Headache: Secondary | ICD-10-CM

## 2013-03-27 DIAGNOSIS — I10 Essential (primary) hypertension: Secondary | ICD-10-CM

## 2013-03-27 LAB — GLUCOSE, CAPILLARY
Glucose-Capillary: 147 mg/dL — ABNORMAL HIGH (ref 70–99)
Glucose-Capillary: 230 mg/dL — ABNORMAL HIGH (ref 70–99)

## 2013-03-27 MED ORDER — LACTULOSE 10 GM/15ML PO SOLN
20.0000 g | Freq: Two times a day (BID) | ORAL | Status: DC | PRN
  Filled 2013-03-27: qty 30

## 2013-03-27 MED ORDER — INSULIN GLARGINE 100 UNIT/ML ~~LOC~~ SOLN: 12.0000 [IU] | Freq: Every day | SUBCUTANEOUS | Status: DC

## 2013-03-27 MED ORDER — METOCLOPRAMIDE HCL 5 MG PO TABS: 5.0000 mg | ORAL_TABLET | Freq: Three times a day (TID) | ORAL | Status: DC

## 2013-03-27 MED ORDER — LACTULOSE 10 GM/15ML PO SOLN: 20.0000 g | Freq: Two times a day (BID) | ORAL | Status: DC | PRN

## 2013-03-27 MED ORDER — OXYCODONE HCL 5 MG PO CAPS: 5.0000 mg | ORAL_CAPSULE | Freq: Four times a day (QID) | ORAL | Status: DC | PRN

## 2013-03-27 MED ORDER — CLONAZEPAM 1 MG PO TABS: 1.0000 mg | ORAL_TABLET | Freq: Two times a day (BID) | ORAL | Status: DC | PRN

## 2013-03-27 NOTE — Discharge Summary (Signed)
Physician Discharge Summary  Tamara Arnold [id number] DOB: 28-Jun-1925 DOA: 03/20/2013  PCP: Stephens Shire, MD  Admit date: 03/20/2013 Discharge date: 03/27/2013  Time spent: 35 minutes  Recommendations for Outpatient Follow-up:  1. Please followup on constipation 2. Repeat BMP in 1 week, patient with history of hyponatremia  Discharge Diagnoses:  Active Problems:   Accelerated hypertension   Diabetes mellitus   Hypokalemia   Hypertension   Constipation   Hyponatremia   Discharge Condition: Stable/improved  Diet recommendation: Heart healthy diet  Filed Weights   03/23/13 1918 03/25/13 2121 03/26/13 2030  Weight: 66.4 kg (146 lb 6.2 oz) 65.726 kg (144 lb 14.4 oz) 64.955 kg (143 lb 3.2 oz)    History of present illness:  Tamara Arnold is a 79 y.o. female  Who had back surgery at [facility] on October 20th. After d/c she went to [facility]. She said that she has had decreased appetite and N/v since d/c after surgery. She feels like her abd is distended. She has been having BMs but they have been firm. She says her blood sugar and blood pressure have been high.  +nausea  In the ER, a CT scan of abd was done that showed some stool. Lipase was mildly elevated but she only had minimal epigastric pain. Her Na and K was found to be low- patient has been hospitalized in the past for similar episodes of low Na and nausea   Hospital Course:  Patient undergoing back surgery at [facility] on 03/10/2013. She was discharged to a skilled nursing facility, presented to the emergency department at [facility] with complaints of nausea, abdominal distention, anorexia. She was admitted to [place] for further evaluation and treatment. Her nausea vomiting abdominal pain were felt to be secondary to constipation likely precipitated by narcotic analgesics. An abdominal ultrasound was performed on 03/22/2013 which showed no acute abnormality post cholecystectomy. CT scan of  abdomen and pelvis showed no acute findings in the back abdomen or pelvis however moderate stool burden throughout noted. On 03/26/2013 she was given a milk and molasses enema which resulted in a large bowel movement. She reported improvement to symptoms after having a bowel movement. Given clinical improvement, she was discharged back to her skilled nursing facility on 03/27/2013.  Procedures:  Complete abdominal ultrasound performed on 03/22/2013 impression: No acute abnormality post cholecystectomy. Bilateral renal cysts. Pancreatic tail obscured by overlying gas.    Discharge Exam: Filed Vitals:   03/27/13 0913  BP: 162/66  Pulse: 85  Temp: 97.9 F (36.6 C)  Resp: 18    General: Patient is sitting at bedside chair, states feeling better and would like to be discharged back to her skilled nursing facility today. Cardiovascular: Regular rate and rhythm normal S1-S2 Respiratory: Clear to auscultation bilaterally no wheezing rhonchi or rales Abdomen: Soft, improvement to distention. No rebound tenderness or guarding Extremities: No edema  Discharge Instructions  Discharge Orders   Future Orders Complete By Expires   Diet - low sodium heart healthy  As directed    Increase activity slowly  As directed        Medication List    STOP taking these medications       furosemide 20 MG tablet  Commonly known as:  LASIX     hydrochlorothiazide 12.5 MG tablet  Commonly known as:  HYDRODIURIL     oxyCODONE-acetaminophen 5-325 MG per tablet  Commonly known as:  PERCOCET/ROXICET     perphenazine 4 MG tablet  Commonly  known as:  TRILAFON     perphenazine-amitriptyline 2-25 MG Tabs  Commonly known as:  ETRAFON/TRIAVIL     promethazine 25 MG/ML injection  Commonly known as:  PHENERGAN      TAKE these medications       ACETAMINOPHEN-BUTALBITAL 50-325 MG Tabs  Take 1 tablet by mouth daily.     amitriptyline 25 MG tablet  Commonly known as:  ELAVIL  Take 50 mg by mouth at  bedtime.     amLODipine 5 MG tablet  Commonly known as:  NORVASC  Take 5 mg by mouth 2 (two) times daily.     ascorbic acid 500 MG tablet  Commonly known as:  VITAMIN C  Take 500 mg by mouth daily.     aspirin 81 MG chewable tablet  Chew 81 mg by mouth daily.     CALCIUM 500 PO  Take 500 mg by mouth daily.     clonazePAM 1 MG tablet  Commonly known as:  KLONOPIN  Take 1 tablet (1 mg total) by mouth 2 (two) times daily as needed for anxiety. For anxiety     fenofibrate 160 MG tablet  Take 160 mg by mouth daily.     Fish Oil 1200 MG Caps  Take 2 capsules by mouth daily.     fluticasone 50 MCG/ACT nasal spray  Commonly known as:  FLONASE  Place 2 sprays into the nose daily.     folic acid 1 MG tablet  Commonly known as:  FOLVITE  Take 1 mg by mouth 2 (two) times daily.     hydrALAZINE 50 MG tablet  Commonly known as:  APRESOLINE  Take 50 mg by mouth 2 (two) times daily.     insulin glargine 100 UNIT/ML injection  Commonly known as:  LANTUS  Inject 0.12 mLs (12 Units total) into the skin daily.     insulin regular 100 units/mL injection  Commonly known as:  NOVOLIN R,HUMULIN R  Inject 2-10 Units into the skin 3 (three) times daily before meals.     lactulose 10 GM/15ML solution  Commonly known as:  CHRONULAC  Take 30 mLs (20 g total) by mouth 2 (two) times daily as needed for mild constipation or moderate constipation.     lisinopril 40 MG tablet  Commonly known as:  PRINIVIL,ZESTRIL  Take 40 mg by mouth daily.     meloxicam 15 MG tablet  Commonly known as:  MOBIC  Take 15 mg by mouth daily.     metoCLOPramide 5 MG tablet  Commonly known as:  REGLAN  Take 1 tablet (5 mg total) by mouth 4 (four) times daily -  before meals and at bedtime.     multivitamins ther. w/minerals Tabs tablet  Take 1 tablet by mouth daily.     beta carotene w/minerals tablet  Take 1 tablet by mouth daily.     omeprazole 20 MG capsule  Commonly known as:  PRILOSEC  Take 20 mg  by mouth daily.     ondansetron 4 MG tablet  Commonly known as:  ZOFRAN  Take 1 tablet (4 mg total) by mouth every 6 (six) hours as needed for nausea.     oxycodone 5 MG capsule  Commonly known as:  OXY-IR  Take 1 capsule (5 mg total) by mouth every 6 (six) hours as needed for pain.     polyethylene glycol packet  Commonly known as:  MIRALAX / GLYCOLAX  Take 17 g by mouth daily.  propranolol 60 MG tablet  Commonly known as:  INDERAL  Take 60 mg by mouth 2 (two) times daily.     rosuvastatin 20 MG tablet  Commonly known as:  CRESTOR  Take 20 mg by mouth daily.     sennosides-docusate sodium 8.6-50 MG tablet  Commonly known as:  SENOKOT-S  Take 2 tablets by mouth 2 (two) times daily as needed for constipation.     tamsulosin 0.4 MG Caps capsule  Commonly known as:  FLOMAX  Take 0.4 mg by mouth daily. Last dose today     vitamin E 400 UNIT capsule  Generic drug:  vitamin E  Take 400 Units by mouth daily.       Allergies  Allergen Reactions  . Gabapentin Other (See Comments)    headaches  . Prednisone Other (See Comments)    Headaches-all steroids  . Topiramate Other (See Comments)    Makes headaches worse  . Wellbutrin [Bupropion Hcl] Other (See Comments)    Caused headaches       Follow-up Information   Follow up with BURNETT,BRENT A, MD In 1 week.   Specialty:  Family Medicine   Contact information:   [id number] [place] [address] [phone]        The results of significant diagnostics from this hospitalization (including imaging, microbiology, ancillary and laboratory) are listed below for reference.    Significant Diagnostic Studies: US Abdomen Complete  03/22/2013   CLINICAL DATA:  Subsequent evaluation abdominal pain and distention and elevated serum lipase. Surgical history includes cholecystectomy.  EXAM: ULTRASOUND ABDOMEN COMPLETE  COMPARISON:  CT abdomen and pelvis 03/20/2013, 09/12/2012.  FINDINGS: Gallbladder:   Surgically absent.  Common bile duct:  Diameter: 3 mm.  Liver:  Normal size and echotexture without focal parenchymal abnormality. Patent portal vein with hepatopetal flow.  IVC:  Patent.  Pancreas:  Normal-appearing head and the body. Tail obscured by overlying bowel gas. Pancreas was atrophic but otherwise normal in appearance on the CT 2 days ago.  Spleen:  Normal size and echotexture without focal parenchymal abnormality.  Right Kidney:  Length: Approximately 11.2 cm. No hydronephrosis. Approximate 1 cm simple cyst arising from the upper pole. No significant focal parenchymal abnormality. Well-preserved cortex. Normal parenchymal echotexture. No visible shadowing calculi.  Left Kidney:  Length: Approximately 10.6 cm. No hydronephrosis. Approximate 1 cm simple cyst arising from the mid to lower pole. No significant focal parenchymal abnormality. Well-preserved cortex. Normal parenchymal echotexture. No visible shadowing calculi. Marland Kitchen  Abdominal aorta:  Normal in caliber throughout its visualized course in the abdomen with evidence of atherosclerosis. Maximum diameter 2.5 cm.  Other findings:  Bilateral pleural effusions.  IMPRESSION: 1. No acute abnormality post cholecystectomy. Bilateral renal cysts. Pancreatic tail obscured by overlying bowel gas, but pancreas atrophic and otherwise normal in appearance on the CT 2 days ago. 2. Aortic atherosclerosis without aneurysm, maximum diameter 2.5 cm. 3. Bilateral pleural effusions as noted on the CT.   Electronically Signed   By: Evangeline Dakin M.D.   On: 03/22/2013 09:46   Ct Abdomen Pelvis W Contrast  03/20/2013   CLINICAL DATA:  Abdominal pain, nausea.  EXAM: CT ABDOMEN AND PELVIS WITH CONTRAST  TECHNIQUE: Multidetector CT imaging of the abdomen and pelvis was performed using the standard protocol following bolus administration of intravenous contrast.  CONTRAST:  125mL OMNIPAQUE IOHEXOL 300 MG/ML  SOLN  COMPARISON:  None.  FINDINGS: There are small bilateral  pleural effusions. Compressive atelectasis in the  lower lobes. Heart is borderline in size.  Prior cholecystectomy. Small hypodensity within the left hepatic lobe, stable, likely small cysts. Spleen, pancreas, adrenals and kidneys are unremarkable. Slight fullness of the right renal collecting system, but this is stable since prior study. No renal or ureteral stones. Mild physiologic distention of the urinary bladder. There is gas within the urinary bladder, presumably from recent catheterization.  Moderate stool throughout the colon. Small bowel is decompressed. No evidence of bowel obstruction. Aorta and iliac vessels are heavily calcified, non aneurysmal.  No acute bony abnormality. Postsurgical changes from prior posterior fusion in the lumbar spine. Degenerative changes.  IMPRESSION: No acute findings in the abdomen or pelvis. Moderate stool burden throughout the colon. Mild distention of the urinary bladder.  Small bilateral pleural effusions.   Electronically Signed   By: Rolm Baptise M.D.   On: 03/20/2013 16:36   Dg Chest Port 1 View  03/24/2013   CLINICAL DATA:  79 year old female with shortness of breath and abdominal distention.  EXAM: PORTABLE CHEST - 1 VIEW  COMPARISON:  09/12/2012 and earlier.  FINDINGS: Portable AP upright view at 0115 hrs. Small bilateral pleural effusions persists. No pneumothorax or edema. No consolidation or other confluent pulmonary opacity. Stable cardiac size and mediastinal contours. Partially visible lumbar spinal fusion hardware.  IMPRESSION: Continued small bilateral pleural effusions. No other acute cardiopulmonary abnormality.   Electronically Signed   By: Lars Pinks M.D.   On: 03/24/2013 01:26    Microbiology: Recent Results (from the past 240 hour(s))  URINE CULTURE     Status: None   Collection Time    03/20/13  2:03 PM      Result Value Range Status   Specimen Description URINE, CLEAN CATCH   Final   Special Requests NONE   Final   Culture  Setup Time      Final   Value: 03/20/2013 20:13     Performed at [facility]     Final   Value: 75,000 COLONIES/ML     Performed at [facility]   Culture     Final   Value: Multiple bacterial morphotypes present, none predominant. Suggest appropriate recollection if clinically indicated.     Performed at [facility]   Report Status 03/21/2013 FINAL   Final  MRSA PCR SCREENING     Status: None   Collection Time    03/20/13  8:35 PM      Result Value Range Status   MRSA by PCR NEGATIVE  NEGATIVE Final   Comment:            The GeneXpert MRSA Assay (FDA     approved for NASAL specimens     only), is one component of a     comprehensive MRSA colonization     surveillance program. It is not     intended to diagnose MRSA     infection nor to guide or     monitor treatment for     MRSA infections.     Labs: Basic Metabolic Panel:  Recent Labs Lab 03/21/13 0212  03/21/13 2215 03/22/13 0230 03/23/13 0415 03/24/13 0130 03/25/13 0445  NA 125*  < > 125* 123* 127* 127* 125*  K 2.8*  < > 3.9 3.8 3.3* 4.3 3.6  CL 86*  < > 89* 88* 92* 92* 89*  CO2 31  < > 30 28 28 27 29   GLUCOSE 177*  < > 174* 155* 133* 135*  149*  BUN 11  < > 15 14 11 12 14   CREATININE 0.67  < > 0.85 0.81 0.72 0.74 0.83  CALCIUM 8.6  < > 8.5 8.4 8.4 8.6 8.3*  MG 1.1*  --   --   --   --   --   --   < > = values in this interval not displayed. Liver Function Tests:  Recent Labs Lab 03/20/13 1255  AST 19  ALT 13  ALKPHOS 46  BILITOT 0.4  PROT 5.7*  ALBUMIN 2.4*    Recent Labs Lab 03/20/13 1255 03/21/13 0212 03/23/13 0415 03/24/13 0130  LIPASE 121* 135* 261* 299*   No results found for this basename: AMMONIA,  in the last 168 hours CBC:  Recent Labs Lab 03/20/13 1255 03/21/13 0212 03/22/13 0230 03/23/13 0415 03/24/13 0130 03/25/13 0445  WBC 9.9 9.9 10.4 9.5 12.2* 9.0  NEUTROABS 7.5  --   --   --   --   --   HGB 9.4* 9.4* 8.8* 8.9* 9.3* 8.5*  HCT 25.5* 26.4*  24.4* 25.3* 25.6* 23.3*  MCV 84.7 85.2 86.8 86.9 85.9 86.3  PLT 351 354 386 421* 445* 411*   Cardiac Enzymes:  Recent Labs Lab 03/24/13 0135  TROPONINI <0.30   BNP: BNP (last 3 results)  Recent Labs  03/24/13 0135  PROBNP 14726.0*   CBG:  Recent Labs Lab 03/26/13 0741 03/26/13 1122 03/26/13 1653 03/26/13 2029 03/27/13 0734  GLUCAP 143* 267* 154* 186* 147*       Signed:  Maison Kestenbaum  [facility] 03/27/2013, 10:48 AM

## 2013-03-27 NOTE — Progress Notes (Signed)
Occupational Therapy Treatment Patient Details Name: Tamara Arnold MRN: [id number] DOB: 02/02/1926 Today's Date: 03/27/2013 Time: 1027-1059 OT Time Calculation (min): 32 min  OT Assessment / Plan / Recommendation  History of present illness recent back surger at [facility]; adm from [facility] due to N/V    OT comments  Pt making good progress with functional goals and is scheduled to d/c to SNF for rehab today  Follow Up Recommendations  SNF    Barriers to Discharge   none    Equipment Recommendations  None recommended by OT    Recommendations for Other Services    Frequency Min 2X/week   Progress towards OT Goals Progress towards OT goals: Progressing toward goals  Plan Discharge plan remains appropriate    Precautions / Restrictions Precautions Precautions: Fall;Back Precaution Comments: pt able to recall 3/3 back precautions Required Braces or Orthoses: Spinal Brace Spinal Brace: Lumbar corset;Applied in sitting position Restrictions Weight Bearing Restrictions: No   Pertinent Vitals/Pain 6/10 back    ADL  Grooming: Performed;Min guard;Wash/dry hands;Brushing hair;Wash/dry face Lower Body Bathing: Simulated;Minimal assistance Lower Body Dressing: Performed;Minimal assistance Toilet Transfer: Min guard;Performed Armed forces technical officer Method: Sit to Loss adjuster, chartered: Regular height toilet;Raised toilet seat with arms (or 3-in-1 over toilet);Grab bars Toileting - Clothing Manipulation and Hygiene: Min guard;Performed Where Assessed - Best boy and Hygiene: Standing Tub/Shower Transfer Method: Not assessed Equipment Used: Gait belt;Rolling walker;Other (comment) (3 in 1)    OT Diagnosis:    OT Problem List:   OT Treatment Interventions:     OT Goals(current goals can now be found in the care plan section)    Visit Information  Last OT Received On: 03/27/13 Assistance Needed: +1 History of Present Illness: recent back surger at  [place]; adm from [facility] due to N/V     Subjective Data      Prior Functioning       Cognition  Cognition Arousal/Alertness: Awake/alert Behavior During Therapy: WFL for tasks assessed/performed Overall Cognitive Status: Within Functional Limits for tasks assessed    Mobility  Bed Mobility Bed Mobility: Not assessed Details for Bed Mobility Assistance: pt up in recliner Transfers Sit to Stand: 4: Min guard;From chair/3-in-1;With upper extremity assist;From toilet Stand to Sit: 4: Min guard;To chair/3-in-1;With armrests;To toilet;With upper extremity assist Details for Transfer Assistance: pt requires handrail and UE support to guide/control descent to toilet and chair; pt with LEs buckling and unsteady secondary to fatigue; cues for hand placement and safety     Exercises      Balance Balance Balance Assessed: Yes Dynamic Standing Balance Dynamic Standing - Balance Support: Left upper extremity supported;During functional activity Dynamic Standing - Level of Assistance: 4: Min assist   End of Session OT - End of Session Equipment Utilized During Treatment: Gait belt;Rolling walker;Other (comment) (3 in 1) Activity Tolerance: Patient tolerated treatment well Patient left: in chair;with call bell/phone within reach;with family/visitor present  GO     Britt Bottom 03/27/2013, 1:42 PM

## 2013-03-28 NOTE — Progress Notes (Signed)
Pt discharged to SNF. Pt remains stable. No signs and symptoms of distress. Educated to return to ER in the event of SOB, dizziness, chest pain, or fainting. Jobe Igo, RN

## 2013-12-01 ENCOUNTER — Encounter (HOSPITAL_BASED_OUTPATIENT_CLINIC_OR_DEPARTMENT_OTHER): Payer: Self-pay | Admitting: *Deleted

## 2013-12-01 ENCOUNTER — Other Ambulatory Visit: Payer: Self-pay | Admitting: Orthopedic Surgery

## 2013-12-01 NOTE — Progress Notes (Signed)
Pt is active-off all diabetes meds-had lumb surg [facility] last yr-did [facility]

## 2013-12-03 ENCOUNTER — Encounter (HOSPITAL_BASED_OUTPATIENT_CLINIC_OR_DEPARTMENT_OTHER)
Admission: RE | Disposition: A | Discharge status: Home / Self Care | Payer: Self-pay | Source: Ambulatory Visit | Attending: Orthopedic Surgery

## 2013-12-03 ENCOUNTER — Encounter (HOSPITAL_BASED_OUTPATIENT_CLINIC_OR_DEPARTMENT_OTHER): Payer: Medicare Other | Admitting: Anesthesiology

## 2013-12-03 ENCOUNTER — Encounter (HOSPITAL_BASED_OUTPATIENT_CLINIC_OR_DEPARTMENT_OTHER): Payer: Self-pay | Admitting: *Deleted

## 2013-12-03 ENCOUNTER — Ambulatory Visit (HOSPITAL_BASED_OUTPATIENT_CLINIC_OR_DEPARTMENT_OTHER)
Admission: RE | Admit: 2013-12-03 | Discharge: 2013-12-03 | Disposition: A | Discharge status: Home / Self Care | Payer: Medicare Other | Source: Ambulatory Visit | Attending: Orthopedic Surgery | Admitting: Orthopedic Surgery

## 2013-12-03 ENCOUNTER — Ambulatory Visit (HOSPITAL_BASED_OUTPATIENT_CLINIC_OR_DEPARTMENT_OTHER): Payer: Medicare Other | Admitting: Anesthesiology

## 2013-12-03 DIAGNOSIS — Z85828 Personal history of other malignant neoplasm of skin: Secondary | ICD-10-CM | POA: Insufficient documentation

## 2013-12-03 DIAGNOSIS — Z79899 Other long term (current) drug therapy: Secondary | ICD-10-CM | POA: Diagnosis not present

## 2013-12-03 DIAGNOSIS — F3289 Other specified depressive episodes: Secondary | ICD-10-CM | POA: Diagnosis not present

## 2013-12-03 DIAGNOSIS — Z7982 Long term (current) use of aspirin: Secondary | ICD-10-CM | POA: Diagnosis not present

## 2013-12-03 DIAGNOSIS — G5600 Carpal tunnel syndrome, unspecified upper limb: Secondary | ICD-10-CM | POA: Diagnosis present

## 2013-12-03 DIAGNOSIS — E785 Hyperlipidemia, unspecified: Secondary | ICD-10-CM | POA: Insufficient documentation

## 2013-12-03 DIAGNOSIS — E119 Type 2 diabetes mellitus without complications: Secondary | ICD-10-CM | POA: Insufficient documentation

## 2013-12-03 DIAGNOSIS — I10 Essential (primary) hypertension: Secondary | ICD-10-CM | POA: Diagnosis not present

## 2013-12-03 DIAGNOSIS — D649 Anemia, unspecified: Secondary | ICD-10-CM | POA: Insufficient documentation

## 2013-12-03 DIAGNOSIS — F411 Generalized anxiety disorder: Secondary | ICD-10-CM | POA: Insufficient documentation

## 2013-12-03 DIAGNOSIS — Z791 Long term (current) use of non-steroidal anti-inflammatories (NSAID): Secondary | ICD-10-CM | POA: Insufficient documentation

## 2013-12-03 DIAGNOSIS — F329 Major depressive disorder, single episode, unspecified: Secondary | ICD-10-CM | POA: Diagnosis not present

## 2013-12-03 DIAGNOSIS — K219 Gastro-esophageal reflux disease without esophagitis: Secondary | ICD-10-CM | POA: Diagnosis not present

## 2013-12-03 DIAGNOSIS — M6530 Trigger finger, unspecified finger: Secondary | ICD-10-CM | POA: Insufficient documentation

## 2013-12-03 HISTORY — PX: TRIGGER FINGER RELEASE: SHX641

## 2013-12-03 HISTORY — PX: CARPAL TUNNEL RELEASE: SHX101

## 2013-12-03 LAB — POCT I-STAT, CHEM 8
BUN: 20 mg/dL (ref 6–23)
CALCIUM ION: 1.33 mmol/L — AB (ref 1.13–1.30)
Chloride: 99 mEq/L (ref 96–112)
Creatinine, Ser: 1 mg/dL (ref 0.50–1.10)
GLUCOSE: 130 mg/dL — AB (ref 70–99)
HCT: 30 % — ABNORMAL LOW (ref 36.0–46.0)
Hemoglobin: 10.2 g/dL — ABNORMAL LOW (ref 12.0–15.0)
Potassium: 3.9 mEq/L (ref 3.7–5.3)
Sodium: 135 mEq/L — ABNORMAL LOW (ref 137–147)
TCO2: 27 mmol/L (ref 0–100)

## 2013-12-03 SURGERY — CARPAL TUNNEL RELEASE
Anesthesia: Monitor Anesthesia Care | Site: Wrist | Laterality: Right

## 2013-12-03 MED ORDER — LIDOCAINE HCL (PF) 1 % IJ SOLN
INTRAMUSCULAR | Status: DC | PRN
Start: 2013-12-03 — End: 2013-12-03
  Administered 2013-12-03: 3 mL

## 2013-12-03 MED ORDER — LIDOCAINE HCL (CARDIAC) 20 MG/ML IV SOLN
INTRAVENOUS | Status: DC | PRN
  Administered 2013-12-03: 25 mg via INTRAVENOUS

## 2013-12-03 MED ORDER — CHLORHEXIDINE GLUCONATE 4 % EX LIQD: 60.0000 mL | Freq: Once | CUTANEOUS | Status: DC

## 2013-12-03 MED ORDER — MIDAZOLAM HCL 2 MG/2ML IJ SOLN: 1.0000 mg | INTRAMUSCULAR | Status: DC | PRN

## 2013-12-03 MED ORDER — MIDAZOLAM HCL 2 MG/2ML IJ SOLN
INTRAMUSCULAR | Status: AC
  Filled 2013-12-03: qty 2

## 2013-12-03 MED ORDER — FENTANYL CITRATE 0.05 MG/ML IJ SOLN: 25.0000 ug | INTRAMUSCULAR | Status: DC | PRN

## 2013-12-03 MED ORDER — BUPIVACAINE HCL (PF) 0.25 % IJ SOLN
INTRAMUSCULAR | Status: DC | PRN
  Administered 2013-12-03: 3 mL

## 2013-12-03 MED ORDER — CEFAZOLIN SODIUM-DEXTROSE 2-3 GM-% IV SOLR: 2.0000 g | INTRAVENOUS | Status: DC

## 2013-12-03 MED ORDER — HYDROCODONE-ACETAMINOPHEN 5-325 MG PO TABS: 1.0000 | ORAL_TABLET | Freq: Four times a day (QID) | ORAL | Status: DC | PRN

## 2013-12-03 MED ORDER — MIDAZOLAM HCL 5 MG/5ML IJ SOLN
INTRAMUSCULAR | Status: DC | PRN
  Administered 2013-12-03: 1 mg via INTRAVENOUS

## 2013-12-03 MED ORDER — PROPOFOL 10 MG/ML IV BOLUS
INTRAVENOUS | Status: DC | PRN
  Administered 2013-12-03 (×2): 10 mg via INTRAVENOUS

## 2013-12-03 MED ORDER — LIDOCAINE HCL (PF) 1 % IJ SOLN
INTRAMUSCULAR | Status: AC
  Filled 2013-12-03: qty 30

## 2013-12-03 MED ORDER — CEFAZOLIN SODIUM-DEXTROSE 2-3 GM-% IV SOLR
INTRAVENOUS | Status: AC
  Filled 2013-12-03: qty 50

## 2013-12-03 MED ORDER — LACTATED RINGERS IV SOLN
INTRAVENOUS | Status: DC
  Administered 2013-12-03: 10:00:00 via INTRAVENOUS

## 2013-12-03 MED ORDER — FENTANYL CITRATE 0.05 MG/ML IJ SOLN
INTRAMUSCULAR | Status: AC
  Filled 2013-12-03: qty 2

## 2013-12-03 MED ORDER — FENTANYL CITRATE 0.05 MG/ML IJ SOLN
INTRAMUSCULAR | Status: DC | PRN
  Administered 2013-12-03: 50 ug via INTRAVENOUS

## 2013-12-03 MED ORDER — FENTANYL CITRATE 0.05 MG/ML IJ SOLN: 50.0000 ug | INTRAMUSCULAR | Status: DC | PRN

## 2013-12-03 MED ORDER — CEFAZOLIN SODIUM-DEXTROSE 2-3 GM-% IV SOLR
2.0000 g | INTRAVENOUS | Status: AC
  Administered 2013-12-03: 2 g via INTRAVENOUS

## 2013-12-03 MED ORDER — ONDANSETRON HCL 4 MG/2ML IJ SOLN: 4.0000 mg | Freq: Once | INTRAMUSCULAR | Status: DC | PRN

## 2013-12-03 MED ORDER — BUPIVACAINE HCL (PF) 0.25 % IJ SOLN
INTRAMUSCULAR | Status: AC
  Filled 2013-12-03: qty 30

## 2013-12-03 SURGICAL SUPPLY — 41 items
BANDAGE COBAN STERILE 2 (GAUZE/BANDAGES/DRESSINGS) IMPLANT
BLADE MINI RND TIP GREEN BEAV (BLADE) IMPLANT
BLADE SURG 15 STRL LF DISP TIS (BLADE) ×2 IMPLANT
BLADE SURG 15 STRL SS (BLADE) ×4
BNDG CMPR 9X4 STRL LF SNTH (GAUZE/BANDAGES/DRESSINGS) ×2
BNDG COHESIVE 3X5 TAN STRL LF (GAUZE/BANDAGES/DRESSINGS) ×4 IMPLANT
BNDG ESMARK 4X9 LF (GAUZE/BANDAGES/DRESSINGS) ×4 IMPLANT
BNDG GAUZE ELAST 4 BULKY (GAUZE/BANDAGES/DRESSINGS) ×4 IMPLANT
CHLORAPREP W/TINT 26ML (MISCELLANEOUS) ×4 IMPLANT
CORDS BIPOLAR (ELECTRODE) ×4 IMPLANT
COVER MAYO STAND STRL (DRAPES) ×4 IMPLANT
COVER TABLE BACK 60X90 (DRAPES) ×4 IMPLANT
CUFF TOURNIQUET SINGLE 18IN (TOURNIQUET CUFF) ×4 IMPLANT
DECANTER SPIKE VIAL GLASS SM (MISCELLANEOUS) IMPLANT
DRAPE EXTREMITY T 121X128X90 (DRAPE) ×4 IMPLANT
DRAPE SURG 17X23 STRL (DRAPES) ×4 IMPLANT
DRSG PAD ABDOMINAL 8X10 ST (GAUZE/BANDAGES/DRESSINGS) ×4 IMPLANT
GAUZE SPONGE 4X4 12PLY STRL (GAUZE/BANDAGES/DRESSINGS) ×4 IMPLANT
GAUZE XEROFORM 1X8 LF (GAUZE/BANDAGES/DRESSINGS) ×4 IMPLANT
GLOVE BIOGEL PI IND STRL 7.0 (GLOVE) ×4 IMPLANT
GLOVE BIOGEL PI IND STRL 8.5 (GLOVE) ×2 IMPLANT
GLOVE BIOGEL PI INDICATOR 7.0 (GLOVE) ×4
GLOVE BIOGEL PI INDICATOR 8.5 (GLOVE) ×2
GLOVE ECLIPSE 6.5 STRL STRAW (GLOVE) ×4 IMPLANT
GLOVE SURG ORTHO 8.0 STRL STRW (GLOVE) ×4 IMPLANT
GOWN STRL REUS W/ TWL LRG LVL3 (GOWN DISPOSABLE) ×2 IMPLANT
GOWN STRL REUS W/TWL LRG LVL3 (GOWN DISPOSABLE) ×4
GOWN STRL REUS W/TWL XL LVL3 (GOWN DISPOSABLE) ×4 IMPLANT
NEEDLE 27GAX1X1/2 (NEEDLE) ×4 IMPLANT
NS IRRIG 1000ML POUR BTL (IV SOLUTION) ×4 IMPLANT
PACK BASIN DAY SURGERY FS (CUSTOM PROCEDURE TRAY) ×4 IMPLANT
PADDING CAST ABS 4INX4YD NS (CAST SUPPLIES)
PADDING CAST ABS COTTON 4X4 ST (CAST SUPPLIES) IMPLANT
SHEET MEDIUM DRAPE 40X70 STRL (DRAPES) ×4 IMPLANT
STOCKINETTE 4X48 STRL (DRAPES) ×4 IMPLANT
SUT VICRYL 4-0 PS2 18IN ABS (SUTURE) IMPLANT
SUT VICRYL RAPIDE 4/0 PS 2 (SUTURE) ×4 IMPLANT
SYR BULB 3OZ (MISCELLANEOUS) ×4 IMPLANT
SYRINGE CONTROL L 12CC (SYRINGE) ×4 IMPLANT
TOWEL OR 17X24 6PK STRL BLUE (TOWEL DISPOSABLE) ×8 IMPLANT
UNDERPAD 30X30 INCONTINENT (UNDERPADS AND DIAPERS) ×4 IMPLANT

## 2013-12-03 NOTE — H&P (Signed)
Tamara Arnold is an 80 year old right hand dominant female with bilateral hand pain, numbness and tingling, stinging and burning right greater than left. She has been seen in the past for the possibility of carpal tunnel syndrome. She has also developed triggering of her right small finger. This was injected on her last visit but has not resolved this for her. She is complaining of numbness and tingling thumb through ring finger on a constant basis especially right side and to a lesser extent left with a severe intermittent, throbbing, aching and burning type pain with numbness on a constant basis. It does occasionally awaken her from sleep. Activity makes it worse and rest makes it better. She has been taking Meloxicam. She does have a history of diabetes. She is unable to tolerate steroids, Gabapentin, Topomax, and Wellbutrin. She has nerve conduction studies and this reveals a very significant carpal tunnel syndrome bilaterally with a motor delay and no response on her right side, 10.7 on the left, sensory delay of 3.6 and 3.7, amplitude diminution to 3.3 bilaterally. 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  PAST MEDICAL HISTORY: She is on the following medications. Amitriptyline, amlodipine, aspirin, Beta Carotene, butalbital-acetaminophen, calcium +D, clonazepam, Crestor, fenofibrate, fluticasone, folic acid, metoclopramide, hydralazine HCI, hydrocodone-acetaminophen, lisinopril, Meloxicam, Metoclopramide, multivitamins, Ocuvite, Omega 3, omeprazole, propranolol, topiramate, vitamin C and vitamin E. She has had the following surgeries: appendectomy, hysterectomy, gall bladder, hernia, cyst removed from head, cataract surgery bilaterally.  FAMILY H ISTORY: Positive for diabetes otherwise negative.  SOCIAL HISTORY: She does not smoke or drink. She is married and retired.  REVIEW OF SYSTEMS: Positive for glasses, high BP, headaches, easy bruising  and bleeding, otherwise negative for 14 points. Tamara Arnold is an 80 y.o. female.   Chief Complaint: Carpal tunnel syndrome bilateral and trigger finger right small HPI: see above  Past Medical History  Diagnosis Date  . Hypertension   . Hyperlipidemia   . GERD (gastroesophageal reflux disease)   . Anemia   . Chronic lower back pain   . Anxiety   . Shortness of breath     "lately cause my stomach is swollen" (03/20/2013)  . History of blood transfusion 02/2013    "related to this recent back OR" (03/20/2013)  . Migraine     "not migraines/neurologist" (09/12/2012)  . Daily headache     "all the time for 37 yrs" (03/20/2013)  . Arthritis     "neck, back; none in my knees" (03/20/2013)  . Depression   . Skin cancer     "on my face alot; had one cut off left leg before; (03/20/2013)  . Type II diabetes mellitus     off all meds now    Past Surgical History  Procedure Laterality Date  . Cystectomy      "top of my head; removed @ [facility]; it was benign" (09/12/2012)  . Cataract extraction w/ intraocular lens  implant, bilateral Bilateral   . Appendectomy  1934  . Cholecystectomy  1970's?  . Hernia repair    . Umbilical hernia repair    . Abdominal hysterectomy    . Skin cancer excision      [facility]  . Back surgery  03/10/2013    [facility]-spinal stenosis    Family History  Problem Relation Age of Onset  . Diabetes type II Father    Social History:  reports that she has never smoked. She has never used smokeless  tobacco. She reports that she does not drink alcohol or use illicit drugs.  Allergies:  Allergies  Allergen Reactions  . Gabapentin Other (See Comments)    headaches  . Prednisone Other (See Comments)    Headaches-all steroids  . Topiramate Other (See Comments)    Makes headaches worse  . Wellbutrin [Bupropion Hcl] Other (See Comments)    Caused headaches    Medications Prior to Admission  Medication Sig Dispense Refill  . amitriptyline (ELAVIL) 25 MG  tablet Take 50 mg by mouth at bedtime.      Marland Kitchen amLODipine (NORVASC) 5 MG tablet Take 5 mg by mouth 2 (two) times daily.      Marland Kitchen ascorbic acid (VITAMIN C) 500 MG tablet Take 500 mg by mouth daily.        Marland Kitchen aspirin 81 MG chewable tablet Chew 81 mg by mouth daily.        . beta carotene w/minerals (OCUVITE) tablet Take 1 tablet by mouth daily.      . Calcium-Magnesium-Vitamin D (CALCIUM 500 PO) Take 500 mg by mouth daily.      . clonazePAM (KLONOPIN) 1 MG tablet Take 1 tablet (1 mg total) by mouth 2 (two) times daily as needed for anxiety. For anxiety  30 tablet  0  . fenofibrate 160 MG tablet Take 160 mg by mouth daily.      . fluticasone (FLONASE) 50 MCG/ACT nasal spray Place 2 sprays into the nose daily.      . folic acid (FOLVITE) 1 MG tablet Take 1 mg by mouth 2 (two) times daily.        . hydrALAZINE (APRESOLINE) 50 MG tablet Take 50 mg by mouth 2 (two) times daily.       Marland Kitchen lisinopril (PRINIVIL,ZESTRIL) 40 MG tablet Take 40 mg by mouth daily.      . meloxicam (MOBIC) 15 MG tablet Take 15 mg by mouth daily.      . Multiple Vitamins-Minerals (MULTIVITAMINS THER. W/MINERALS) TABS Take 1 tablet by mouth daily.        Marland Kitchen omeprazole (PRILOSEC) 20 MG capsule Take 20 mg by mouth daily.        . ondansetron (ZOFRAN) 4 MG tablet Take 1 tablet (4 mg total) by mouth every 6 (six) hours as needed for nausea.  20 tablet  0  . polyethylene glycol (MIRALAX / GLYCOLAX) packet Take 17 g by mouth daily.      . propranolol (INDERAL) 60 MG tablet Take 60 mg by mouth 2 (two) times daily.      . rosuvastatin (CRESTOR) 20 MG tablet Take 20 mg by mouth daily.      . vitamin E (VITAMIN E) 400 UNIT capsule Take 400 Units by mouth daily.        . metoCLOPramide (REGLAN) 5 MG tablet Take 1 tablet (5 mg total) by mouth 4 (four) times daily -  before meals and at bedtime.  60 tablet  0    Results for orders placed during the hospital encounter of 12/03/13 (from the past 48 hour(s))  POCT I-STAT, CHEM 8     Status: Abnormal    Collection Time    12/03/13  9:40 AM      Result Value Ref Range   Sodium 135 (*) 137 - 147 mEq/L   Potassium 3.9  3.7 - 5.3 mEq/L   Chloride 99  96 - 112 mEq/L   BUN 20  6 - 23 mg/dL   Creatinine, Ser 1.00  0.50 - 1.10 mg/dL   Glucose, Bld 130 (*) 70 - 99 mg/dL   Calcium, Ion 1.33 (*) 1.13 - 1.30 mmol/L   TCO2 27  0 - 100 mmol/L   Hemoglobin 10.2 (*) 12.0 - 15.0 g/dL   HCT 30.0 (*) 36.0 - 46.0 %    No results found.   Pertinent items are noted in HPI.  Blood pressure 188/72, pulse 66, temperature 97.5 F (36.4 C), temperature source Oral, resp. rate 16, height 5\' 2"  (1.575 m), weight 64.411 kg (142 lb), SpO2 97.00%.  General appearance: alert, cooperative and appears stated age Head: Normocephalic, without obvious abnormality Neck: no JVD Resp: clear to auscultation bilaterally Cardio: regular rate and rhythm, S1, S2 normal, no murmur, click, rub or gallop GI: soft, non-tender; bowel sounds normal; no masses,  no organomegaly Extremities: extremities normal, atraumatic, no cyanosis or edema and trigger right small finger Pulses: 2+ and symmetric Skin: Skin color, texture, turgor normal. No rashes or lesions Neurologic: Grossly normal Incision/Wound: na  Assessment/Plan Dx: right Carpal tunnel syndrome trigger right small finger We have discussed the possibility of release of her right carpal tunnel and A-1 pulley righ small finger. The pre, peri and post op course are discussed along with risks and complications.  She is aware there is no guarantee with surgery, possibility of infection, recurrence, injury to arteries, nerves and tendons, incomplete relief of symptoms and dystrophy.    Madox Corkins R 12/03/2013, 10:00 AM

## 2013-12-03 NOTE — Anesthesia Postprocedure Evaluation (Signed)
  Anesthesia Post-op Note  Patient: Tamara Arnold  Procedure(s) Performed: Procedure(s): RIGHT CARPAL TUNNEL RELEASE (Right) RELEASE TRIGGER FINGER/A-1 PULLEY SMALL FINGER (Right)  Patient Location: PACU  Anesthesia Type: MAC   Level of Consciousness: awake, alert  and oriented  Airway and Oxygen Therapy: Patient Spontanous Breathing  Post-op Pain: none  Post-op Assessment: Post-op Vital signs reviewed  Post-op Vital Signs: Reviewed  Last Vitals:  Filed Vitals:   12/03/13 1215  BP: 168/64  Pulse: 69  Temp: 36.6 C  Resp: 20    Complications: No apparent anesthesia complications

## 2013-12-03 NOTE — Brief Op Note (Signed)
12/03/2013  11:01 AM  PATIENT:  Tamara Arnold  80 y.o. female  PRE-OPERATIVE DIAGNOSIS:  RIGHT CARPAL TUNNEL SYNDROME,STENOSING TENOSYNOVITIS RIGHT SMALL FINGER  POST-OPERATIVE DIAGNOSIS:  RIGHT CARPAL TUNNEL SYNDROME,STENOSING TENOSYNOVITIS RIGHT SMALL FINGER  PROCEDURE:  Procedure(s): RIGHT CARPAL TUNNEL RELEASE (Right) RELEASE TRIGGER FINGER/A-1 PULLEY SMALL FINGER (Right)  SURGEON:  Surgeon(s) and Role:    * Wynonia Sours, MD - Primary  PHYSICIAN ASSISTANT:   ASSISTANTS: none   ANESTHESIA:   local and IV sedation  EBL:     BLOOD ADMINISTERED:none  DRAINS: none   LOCAL MEDICATIONS USED:  BUPIVICAINE  and XYLOCAINE   SPECIMEN:  No Specimen  DISPOSITION OF SPECIMEN:  N/A  COUNTS:  YES  TOURNIQUET:   Total Tourniquet Time Documented: Forearm (Right) - 15 minutes Total: Forearm (Right) - 15 minutes   DICTATION: .Other Dictation: Dictation Number [id number]  PLAN OF CARE: Discharge to home after PACU  PATIENT DISPOSITION:  PACU - hemodynamically stable.

## 2013-12-03 NOTE — Discharge Instructions (Addendum)

## 2013-12-03 NOTE — Anesthesia Preprocedure Evaluation (Signed)
Anesthesia Evaluation  Patient identified by MRN, date of birth, ID band Patient awake    Reviewed: Allergy & Precautions, H&P , NPO status , Patient's Chart, lab work & pertinent test results  Airway Mallampati: I TM Distance: >3 FB Neck ROM: Full    Dental  (+) Teeth Intact, Dental Advisory Given   Pulmonary shortness of breath,  breath sounds clear to auscultation        Cardiovascular hypertension, Pt. on medications and Pt. on home beta blockers Rhythm:Regular Rate:Normal     Neuro/Psych    GI/Hepatic GERD-  Medicated and Controlled,  Endo/Other  diabetes, Well Controlled, Type 2  Renal/GU      Musculoskeletal   Abdominal   Peds  Hematology   Anesthesia Other Findings   Reproductive/Obstetrics                           Anesthesia Physical Anesthesia Plan  ASA: III  Anesthesia Plan: MAC   Post-op Pain Management:    Induction: Intravenous  Airway Management Planned: Simple Face Mask  Additional Equipment:   Intra-op Plan:   Post-operative Plan:   Informed Consent: I have reviewed the patients History and Physical, chart, labs and discussed the procedure including the risks, benefits and alternatives for the proposed anesthesia with the patient or authorized representative who has indicated his/her understanding and acceptance.   Dental advisory given  Plan Discussed with: CRNA, Anesthesiologist and Surgeon  Anesthesia Plan Comments:         Anesthesia Quick Evaluation

## 2013-12-03 NOTE — Op Note (Signed)
Dictated number [id number]

## 2013-12-03 NOTE — Anesthesia Procedure Notes (Addendum)
Performed by: Melynda Ripple D   Procedure Name: MAC Date/Time: 12/03/2013 10:28 AM Performed by: Melynda Ripple D Pre-anesthesia Checklist: Patient identified, Timeout performed, Emergency Drugs available, Suction available and Patient being monitored

## 2013-12-03 NOTE — Op Note (Signed)
NAMEMarland Kitchen  Tamara Arnold, Tamara Arnold NO.:  [id number]  MEDICAL RECORD NO.:  [id number]  LOCATION:                                 FACILITY:  PHYSICIAN:  Daryll Brod, M.D.            DATE OF BIRTH:  DATE OF PROCEDURE:  12/03/2013 DATE OF DISCHARGE:                              OPERATIVE REPORT   PREOPERATIVE DIAGNOSES:  Carpal tunnel syndrome.  Bilateral stenosing tenosynovitis, right small finger.  POSTOPERATIVE DIAGNOSES:  Carpal tunnel syndrome.  Bilateral stenosing tenosynovitis, right small finger.  OPERATION:  Release of right carpal canal with release of A1 pulley, right small finger.  SURGEON:  Daryll Brod, MD.  ANESTHESIA:  Metacarpal block with IV sedation.  ANESTHESIOLOGIST:  Crews.  HISTORY:  The patient is an 80 year old female with a history of carpal tunnel syndrome, markedly positive nerve conductions, and triggering of her right small finger.  She has elected to undergo surgical decompression of the carpal canal and release of the A1 pulley of the right small finger.  Pre, peri, and postoperative course have been discussed along with risks and complications.  She is aware that there is no guarantee with the surgery, possibility of infection; recurrence of injury to arteries, nerves, tendons; incomplete relief of symptoms, dystrophy.  In the preoperative area, the patient is seen, the extremity marked by both patient and surgeon.  Antibiotic given.  PROCEDURE IN DETAIL:  The patient was brought to the operating room, where she was prepped using ChloraPrep, supine position with the right arm free.  A 3-minute dry time was allowed.  Time-out taken, confirming the patient and procedure.  She was sedated.  Local infiltration was given to the area of incision of both the A1 pulley of the right small finger, carpal tunnel syndrome, right hand including the distal forearm. A combination of Xylocaine and Marcaine were used, approximately 10 mL was injected.   The limb was exsanguinated with an Esmarch bandage. Tourniquet was placed on the forearm was inflated to 250 mmHg.  An oblique incision was made over the A1 pulley of the right small finger carried down through subcutaneous tissue.  Bleeders were electrocauterized with bipolar.  Neurovascular structures protected.  An incision made on the radial aspect of the A1 pulley.  A small incision made centrally in A2.  Very significant tenosynovitis was present proximally.  This was excised.  The independence of each tendon was noted.  The wound was irrigated, placed through a full range motion, no further triggering was noted, and closed with interrupted 4-0 Vicryl Rapide sutures.  A separate incision was then made longitudinally in the right palm, carried down through subcutaneous tissue.  Bleeders again were electrocauterized.  Palmar fascia was split.  Superficial palmar arch identified.  Flexor tendon to the ring little finger identified to the ulnar side of the median nerve.  Carpal retinaculum was incised with sharp dissection.  Right angle and Sewall retractor were placed between skin and forearm fascia.  The fascia was released for approximately 2 cm proximal to the wrist crease under direct vision.  Canal was explored. Air compression to the nerve was apparent.  No further lesions  were identified.  The motor branch entered into muscle.  The wound was irrigated and the skin closed with interrupted 4-0 Vicryl Rapide.  A sterile compressive dressing with the fingers and thumb free was applied.  On deflation of the tourniquet, all fingers immediately pinked.  She was taken to the recovery room for observation in satisfactory condition.  She will be discharged home to return to the [place] in 1 week on Norco.          ______________________________ Daryll Brod, M.D.     GK/MEDQ  D:  12/03/2013  T:  12/03/2013  Job:  [id number]

## 2013-12-03 NOTE — Transfer of Care (Signed)
Immediate Anesthesia Transfer of Care Note  Patient: Tamara Arnold  Procedure(s) Performed: Procedure(s): RIGHT CARPAL TUNNEL RELEASE (Right) RELEASE TRIGGER FINGER/A-1 PULLEY SMALL FINGER (Right)  Patient Location: PACU  Anesthesia Type:MAC  Level of Consciousness: awake, alert  and oriented  Airway & Oxygen Therapy: Patient Spontanous Breathing and Patient connected to face mask oxygen  Post-op Assessment: Report given to PACU RN and Post -op Vital signs reviewed and stable  Post vital signs: Reviewed and stable  Complications: No apparent anesthesia complications

## 2013-12-04 ENCOUNTER — Encounter (HOSPITAL_BASED_OUTPATIENT_CLINIC_OR_DEPARTMENT_OTHER): Payer: Self-pay | Admitting: Orthopedic Surgery

## 2015-05-27 ENCOUNTER — Encounter (HOSPITAL_COMMUNITY): Payer: Self-pay | Admitting: Emergency Medicine

## 2015-05-27 ENCOUNTER — Emergency Department (HOSPITAL_COMMUNITY): Payer: PPO

## 2015-05-27 ENCOUNTER — Emergency Department (HOSPITAL_COMMUNITY)
Admission: EM | Admit: 2015-05-27 | Discharge: 2015-05-27 | Disposition: A | Discharge status: Home / Self Care | Payer: PPO | Attending: Emergency Medicine | Admitting: Emergency Medicine

## 2015-05-27 DIAGNOSIS — Z85828 Personal history of other malignant neoplasm of skin: Secondary | ICD-10-CM | POA: Diagnosis not present

## 2015-05-27 DIAGNOSIS — I10 Essential (primary) hypertension: Secondary | ICD-10-CM | POA: Insufficient documentation

## 2015-05-27 DIAGNOSIS — S0101XA Laceration without foreign body of scalp, initial encounter: Secondary | ICD-10-CM | POA: Insufficient documentation

## 2015-05-27 DIAGNOSIS — Z7951 Long term (current) use of inhaled steroids: Secondary | ICD-10-CM | POA: Diagnosis not present

## 2015-05-27 DIAGNOSIS — Z79899 Other long term (current) drug therapy: Secondary | ICD-10-CM | POA: Diagnosis not present

## 2015-05-27 DIAGNOSIS — D649 Anemia, unspecified: Secondary | ICD-10-CM | POA: Diagnosis not present

## 2015-05-27 DIAGNOSIS — Y9301 Activity, walking, marching and hiking: Secondary | ICD-10-CM | POA: Insufficient documentation

## 2015-05-27 DIAGNOSIS — Z7982 Long term (current) use of aspirin: Secondary | ICD-10-CM | POA: Diagnosis not present

## 2015-05-27 DIAGNOSIS — W01198A Fall on same level from slipping, tripping and stumbling with subsequent striking against other object, initial encounter: Secondary | ICD-10-CM | POA: Insufficient documentation

## 2015-05-27 DIAGNOSIS — G43909 Migraine, unspecified, not intractable, without status migrainosus: Secondary | ICD-10-CM | POA: Diagnosis not present

## 2015-05-27 DIAGNOSIS — E119 Type 2 diabetes mellitus without complications: Secondary | ICD-10-CM | POA: Diagnosis not present

## 2015-05-27 DIAGNOSIS — F329 Major depressive disorder, single episode, unspecified: Secondary | ICD-10-CM | POA: Diagnosis not present

## 2015-05-27 DIAGNOSIS — G8929 Other chronic pain: Secondary | ICD-10-CM | POA: Diagnosis not present

## 2015-05-27 DIAGNOSIS — S199XXA Unspecified injury of neck, initial encounter: Secondary | ICD-10-CM | POA: Diagnosis not present

## 2015-05-27 DIAGNOSIS — Y998 Other external cause status: Secondary | ICD-10-CM | POA: Diagnosis not present

## 2015-05-27 DIAGNOSIS — S0990XA Unspecified injury of head, initial encounter: Secondary | ICD-10-CM | POA: Diagnosis not present

## 2015-05-27 DIAGNOSIS — M158 Other polyosteoarthritis: Secondary | ICD-10-CM | POA: Diagnosis not present

## 2015-05-27 DIAGNOSIS — W19XXXA Unspecified fall, initial encounter: Secondary | ICD-10-CM

## 2015-05-27 DIAGNOSIS — F419 Anxiety disorder, unspecified: Secondary | ICD-10-CM | POA: Diagnosis not present

## 2015-05-27 DIAGNOSIS — M542 Cervicalgia: Secondary | ICD-10-CM | POA: Diagnosis not present

## 2015-05-27 DIAGNOSIS — K219 Gastro-esophageal reflux disease without esophagitis: Secondary | ICD-10-CM | POA: Diagnosis not present

## 2015-05-27 DIAGNOSIS — E785 Hyperlipidemia, unspecified: Secondary | ICD-10-CM | POA: Insufficient documentation

## 2015-05-27 DIAGNOSIS — R51 Headache: Secondary | ICD-10-CM | POA: Diagnosis not present

## 2015-05-27 DIAGNOSIS — Y9289 Other specified places as the place of occurrence of the external cause: Secondary | ICD-10-CM | POA: Diagnosis not present

## 2015-05-27 NOTE — Discharge Instructions (Signed)
Return here as needed.  Follow-up with your primary care doctor. Have [employer] out in 7 days

## 2015-05-27 NOTE — ED Provider Notes (Signed)
CSN: [id number]     Arrival date & time 05/27/15  0810 History   First MD Initiated Contact with Patient 05/27/15 [phone]     Chief Complaint  Patient presents with  . Fall     (Consider location/radiation/quality/duration/timing/severity/associated sxs/prior Treatment) HPI Patient presents to the emergency department with a fall that occurred just prior to arrival.  Patient was attempting to maneuver her walker, when she leaned forward to pick it up and she fell forward hitting her head against the dresser.  She has a laceration to the left lateral scalp.  He should states she did not have any loss of consciousness, chest pain, shortness of breath, weakness, dizziness, blurred vision, back pain, neck pain, fever, incontinence, or syncope.  Patient states that she did not take any medications prior to arrival.  She did apply pressure to the wound on the side of her head Past Medical History  Diagnosis Date  . Hypertension   . Hyperlipidemia   . GERD (gastroesophageal reflux disease)   . Anemia   . Chronic lower back pain   . Anxiety   . Shortness of breath     "lately cause my stomach is swollen" (03/20/2013)  . History of blood transfusion 02/2013    "related to this recent back OR" (03/20/2013)  . Migraine     "not migraines/neurologist" (09/12/2012)  . Daily headache     "all the time for 37 yrs" (03/20/2013)  . Arthritis     "neck, back; none in my knees" (03/20/2013)  . Depression   . Skin cancer     "on my face alot; had one cut off left leg before; (03/20/2013)  . Type II diabetes mellitus ([place])     off all meds now   Past Surgical History  Procedure Laterality Date  . Cystectomy      "top of my head; removed @ [facility]; it was benign" (09/12/2012)  . Cataract extraction w/ intraocular lens  implant, bilateral Bilateral   . Appendectomy  1934  . Cholecystectomy  1970's?  . Hernia repair    . Umbilical hernia repair    . Abdominal hysterectomy    . Skin cancer excision     LLE  . Back surgery  03/10/2013    [facility]  . Carpal tunnel release Right 12/03/2013    Procedure: RIGHT CARPAL TUNNEL RELEASE;  Surgeon: Wynonia Sours, MD;  Location: [place];  Service: Orthopedics;  Laterality: Right;  . Trigger finger release Right 12/03/2013    Procedure: RELEASE TRIGGER FINGER/A-1 PULLEY SMALL FINGER;  Surgeon: Wynonia Sours, MD;  Location: [place];  Service: Orthopedics;  Laterality: Right;   Family History  Problem Relation Age of Onset  . Diabetes type II Father    Social History  Substance Use Topics  . Smoking status: Never Smoker   . Smokeless tobacco: Never Used  . Alcohol Use: No   OB History    No data available     Review of Systems  All other systems negative except as documented in the HPI. All pertinent positives and negatives as reviewed in the HPI.  Allergies  Gabapentin; Prednisone; Topiramate; and Wellbutrin  Home Medications   Prior to Admission medications   Medication Sig Start Date End Date Taking? Authorizing Provider  amitriptyline (ELAVIL) 25 MG tablet Take 50 mg by mouth at bedtime.   Yes Historical Provider, MD  amLODipine (NORVASC) 5 MG tablet Take 5 mg by mouth 2 (two) times  daily.   Yes Historical Provider, MD  ascorbic acid (VITAMIN C) 500 MG tablet Take 500 mg by mouth daily.     Yes Historical Provider, MD  aspirin 81 MG chewable tablet Chew 81 mg by mouth daily.     Yes Historical Provider, MD  beta carotene w/minerals (OCUVITE) tablet Take 1 tablet by mouth daily.   Yes Historical Provider, MD  Calcium-Magnesium-Vitamin D (CALCIUM 500 PO) Take 500 mg by mouth daily.   Yes Historical Provider, MD  clonazePAM (KLONOPIN) 1 MG tablet Take 1 tablet (1 mg total) by mouth 2 (two) times daily as needed for anxiety. For anxiety 03/27/13  Yes Kelvin Cellar, MD  fenofibrate 160 MG tablet Take 160 mg by mouth daily.   Yes Historical Provider, MD  fluticasone (FLONASE) 50 MCG/ACT nasal  spray Place 2 sprays into the nose daily.   Yes Historical Provider, MD  folic acid (FOLVITE) 1 MG tablet Take 1 mg by mouth 2 (two) times daily.     Yes Historical Provider, MD  hydrALAZINE (APRESOLINE) 50 MG tablet Take 50 mg by mouth 4 (four) times daily.    Yes Historical Provider, MD  HYDROcodone-acetaminophen (NORCO) 5-325 MG per tablet Take 1 tablet by mouth every 6 (six) hours as needed for moderate pain. 12/03/13  Yes Daryll Brod, MD  lisinopril (PRINIVIL,ZESTRIL) 40 MG tablet Take 40 mg by mouth daily.   Yes Historical Provider, MD  metoCLOPramide (REGLAN) 5 MG tablet Take 1 tablet (5 mg total) by mouth 4 (four) times daily -  before meals and at bedtime. Patient not taking: Reported on 05/27/2015 03/27/13   Kelvin Cellar, MD  Multiple Vitamins-Minerals (MULTIVITAMINS THER. W/MINERALS) TABS Take 1 tablet by mouth daily.     Yes Historical Provider, MD  omeprazole (PRILOSEC) 20 MG capsule Take 20 mg by mouth daily.     Yes Historical Provider, MD  ondansetron (ZOFRAN) 4 MG tablet Take 1 tablet (4 mg total) by mouth every 6 (six) hours as needed for nausea. 09/13/12   Janece Canterbury, MD  polyethylene glycol [facility] / GLYCOLAX) packet Take 17 g by mouth daily.   Yes Historical Provider, MD  propranolol (INDERAL) 60 MG tablet Take 120 mg by mouth at bedtime.    Yes Historical Provider, MD  rosuvastatin (CRESTOR) 20 MG tablet Take 20 mg by mouth daily.   Yes Historical Provider, MD  vitamin E (VITAMIN E) 400 UNIT capsule Take 400 Units by mouth daily.     Yes Historical Provider, MD   BP 187/65 mmHg  Pulse 58  Temp(Src) 97.6 F (36.4 C) (Oral)  Resp 20  SpO2 97% Physical Exam  Constitutional: She is oriented to person, place, and time. She appears well-developed and well-nourished. No distress.  HENT:  Head: Normocephalic.    Right Ear: No hemotympanum.  Left Ear: No hemotympanum.  Mouth/Throat: Oropharynx is clear and moist.  Eyes: Pupils are equal, round, and reactive to light.    Neck: Normal range of motion. Neck supple.  Cardiovascular: Normal rate, regular rhythm and normal heart sounds.  Exam reveals no gallop and no friction rub.   No murmur heard. Pulmonary/Chest: Effort normal and breath sounds normal. No respiratory distress. She has no wheezes.  Abdominal: Soft. Bowel sounds are normal. She exhibits no distension. There is no tenderness.  Neurological: She is alert and oriented to person, place, and time. She exhibits normal muscle tone. Coordination normal.  Skin: Skin is warm and dry. No rash noted. No erythema.  Psychiatric: She  has a normal mood and affect. Her behavior is normal.  Nursing note and vitals reviewed.   ED Course  Procedures (including critical care time) Labs Review Labs Reviewed - No data to display  Imaging Review Ct Head Wo Contrast  05/27/2015  CLINICAL DATA:  Pain following fall EXAM: CT HEAD WITHOUT CONTRAST CT CERVICAL SPINE WITHOUT CONTRAST TECHNIQUE: Multidetector CT imaging of the head and cervical spine was performed following the standard protocol without intravenous contrast. Multiplanar CT image reconstructions of the cervical spine were also generated. COMPARISON:  Head CT January 23, 2012 FINDINGS: CT HEAD FINDINGS There is age related volume loss. There is a stable posterior right parafalcine arachnoid cyst measuring 1 x 1 cm. There is no other mass. There is no hemorrhage, extra-axial fluid collection, or midline shift. The gray-white compartments appear normal. No acute infarct evident. The bony calvarium appears intact. The mastoid air cells are clear. Visualized orbits appear symmetric bilaterally. There are small retention cysts in the right maxillary and sphenoid sinus regions. CT CERVICAL SPINE FINDINGS There is no apparent fracture. There is slight retrolisthesis of C4 on C5, felt to be due to underlying spondylosis. No other spondylolisthesis. Prevertebral soft tissues and predental space regions are normal. There is  severe disc space narrowing at C3-4, C4-5, C5-6 and C6-7. There are prominent anterior osteophytes at C4 and C5. There is facet hypertrophy at most levels bilaterally. There is marked exit foraminal narrowing on the right at C3-4 and at C4-5 and C5-6 bilaterally. No disc extrusion or stenosis. There are foci of distal left vertebral artery calcification. There is also moderate calcification in each carotid bifurcation and proximal internal carotid artery regions. IMPRESSION: CT head: Small stable posterior right parafalcine arachnoid cyst, a benign finding. No intracranial hemorrhage or extra-axial fluid collection. No new mass. Gray-white compartments appear normal. CT cervical spine: No fracture. Slight spondylolisthesis at C4-5 due to underlying spondylosis. No other spondylolisthesis. Multilevel arthropathy. Calcification in both carotid arteries as well as in the distal left vertebral artery. Electronically Signed   By: Lowella Grip III M.D.   On: 05/27/2015 10:50   Ct Cervical Spine Wo Contrast  05/27/2015  CLINICAL DATA:  Pain following fall EXAM: CT HEAD WITHOUT CONTRAST CT CERVICAL SPINE WITHOUT CONTRAST TECHNIQUE: Multidetector CT imaging of the head and cervical spine was performed following the standard protocol without intravenous contrast. Multiplanar CT image reconstructions of the cervical spine were also generated. COMPARISON:  Head CT January 23, 2012 FINDINGS: CT HEAD FINDINGS There is age related volume loss. There is a stable posterior right parafalcine arachnoid cyst measuring 1 x 1 cm. There is no other mass. There is no hemorrhage, extra-axial fluid collection, or midline shift. The gray-white compartments appear normal. No acute infarct evident. The bony calvarium appears intact. The mastoid air cells are clear. Visualized orbits appear symmetric bilaterally. There are small retention cysts in the right maxillary and sphenoid sinus regions. CT CERVICAL SPINE FINDINGS There is no  apparent fracture. There is slight retrolisthesis of C4 on C5, felt to be due to underlying spondylosis. No other spondylolisthesis. Prevertebral soft tissues and predental space regions are normal. There is severe disc space narrowing at C3-4, C4-5, C5-6 and C6-7. There are prominent anterior osteophytes at C4 and C5. There is facet hypertrophy at most levels bilaterally. There is marked exit foraminal narrowing on the right at C3-4 and at C4-5 and C5-6 bilaterally. No disc extrusion or stenosis. There are foci of distal left vertebral artery calcification. There is  also moderate calcification in each carotid bifurcation and proximal internal carotid artery regions. IMPRESSION: CT head: Small stable posterior right parafalcine arachnoid cyst, a benign finding. No intracranial hemorrhage or extra-axial fluid collection. No new mass. Gray-white compartments appear normal. CT cervical spine: No fracture. Slight spondylolisthesis at C4-5 due to underlying spondylosis. No other spondylolisthesis. Multilevel arthropathy. Calcification in both carotid arteries as well as in the distal left vertebral artery. Electronically Signed   By: Lowella Grip III M.D.   On: 05/27/2015 10:50   I have personally reviewed and evaluated these images and lab results as part of my medical decision-making.   EKG Interpretation None      MDM   Final diagnoses:  Fall, initial encounter  Scalp laceration, initial encounter     The patient is advised to return here as needed.  Patient is advised to have her staples removed in 7 days.  Patient is advised to keep the area clean and dry.  Patient agrees the plan and all questions were answered   LACERATION REPAIR Performed by: Brent General Authorized by: Brent General Consent: Verbal consent obtained. Risks and benefits: risks, benefits and alternatives were discussed Consent given by: patient Patient identity confirmed: provided demographic  data Prepped and Draped in normal sterile fashion Wound explored  Laceration Location: Right posterior scalp bilaterally  Laceration Length: 3.5 cm  No Foreign Bodies seen or palpated  Anesthesia: local infiltration  Local anesthetic: Not applicable   Anesthetic total: N/A   Irrigation method: syringe Amount of cleaning: standard  Skin closure: Staples   Number of sutures: 5   Technique: Stapling   Patient tolerance: Patient tolerated the procedure well with no immediate complications.   Dalia Heading, PA-C 05/27/15 1555  Dorie Rank, MD 05/28/15 [phone]

## 2015-05-27 NOTE — ED Notes (Signed)
Family at bedside.  Pt resting but has a headache 8/10 and states she can tolerate it.

## 2015-05-27 NOTE — ED Notes (Signed)
Per pt, states she was getting dressed-was walking and tripped on loose rug-laceration to left side of head-no LOC

## 2015-06-01 DIAGNOSIS — J209 Acute bronchitis, unspecified: Secondary | ICD-10-CM | POA: Diagnosis not present

## 2015-07-22 ENCOUNTER — Ambulatory Visit (INDEPENDENT_AMBULATORY_CARE_PROVIDER_SITE_OTHER): Payer: PPO | Admitting: Neurology

## 2015-07-22 ENCOUNTER — Encounter: Payer: Self-pay | Admitting: Neurology

## 2015-07-22 VITALS — BP 148/58 | HR 70 | Ht 62.0 in | Wt 128.0 lb

## 2015-07-22 DIAGNOSIS — F22 Delusional disorders: Secondary | ICD-10-CM

## 2015-07-22 NOTE — Progress Notes (Signed)
Reason for visit: Paranoid behavior  Referring physician: Dr. Maude Leriche is a 82 y.o. female  History of present illness:  Tamara Arnold is an 82 year old right-handed white female with a history of anxiety in the past. The patient lives alone, she apparently has normal memory and concentration. She indicates that she does her own finances, she does not operate a motor vehicle. She keeps up with her own medications and appointments. The family lives relatively close to her if she needs someone to help drive her. She apparently has had some issues of persistent paranoid and delusional behavior that revolves around a family within her church. The patient believes that this family is creating rumors about her. She will call other church members to discuss this issue with them. The patient does not generalize this issue to other situations. The patient has been sleeping well, she does have a balance problem, but she uses a walker for ambulation. She fell in early January 2017. A CT scan of the head was done around that time showing no significant abnormalities, no significant degree of atrophy was seen. The patient reports no numbness of the extremities, no weakness of the extremities. She denies any issues controlling the bowels or the bladder. The patient is on clonazepam for the anxiety disorder, and she takes 50 mg of amitriptyline at night. She is sent to this office for an evaluation. The family confirms that she does not have a memory problem.  Past Medical History  Diagnosis Date  . Hypertension   . Hyperlipidemia   . GERD (gastroesophageal reflux disease)   . Anemia   . Chronic lower back pain   . Anxiety   . Shortness of breath     "lately cause my stomach is swollen" (03/20/2013)  . History of blood transfusion 02/2013    "related to this recent back OR" (03/20/2013)  . Migraine     "not migraines/neurologist" (09/12/2012)  . Daily headache     "all the time for 37  yrs" (03/20/2013)  . Arthritis     "neck, back; none in my knees" (03/20/2013)  . Depression   . Skin cancer     "on my face alot; had one cut off left leg before; (03/20/2013)  . Type II diabetes mellitus ([place])     off all meds now  . Macular degeneration of both eyes     Past Surgical History  Procedure Laterality Date  . Cystectomy      "top of my head; removed @ [facility]; it was benign" (09/12/2012)  . Cataract extraction w/ intraocular lens  implant, bilateral Bilateral   . Appendectomy  1934  . Cholecystectomy  1970's?  . Hernia repair    . Umbilical hernia repair    . Abdominal hysterectomy    . Skin cancer excision      LLE  . Back surgery  03/10/2013    [facility]  . Carpal tunnel release Right 12/03/2013    Procedure: RIGHT CARPAL TUNNEL RELEASE;  Surgeon: Wynonia Sours, MD;  Location: [place];  Service: Orthopedics;  Laterality: Right;  . Trigger finger release Right 12/03/2013    Procedure: RELEASE TRIGGER FINGER/A-1 PULLEY SMALL FINGER;  Surgeon: Wynonia Sours, MD;  Location: [place];  Service: Orthopedics;  Laterality: Right;    Family History  Problem Relation Age of Onset  . Diabetes type II Father     Social history:  reports that she has never  smoked. She has never used smokeless tobacco. She reports that she does not drink alcohol or use illicit drugs.  Medications:  Prior to Admission medications   Medication Sig Start Date End Date Taking? Authorizing Provider  acetaminophen (TYLENOL) 325 MG tablet Take 650 mg by mouth every 6 (six) hours as needed.   Yes Historical Provider, MD  amitriptyline (ELAVIL) 25 MG tablet Take 50 mg by mouth at bedtime.   Yes Historical Provider, MD  amLODipine (NORVASC) 5 MG tablet Take 5 mg by mouth 2 (two) times daily.   Yes Historical Provider, MD  ascorbic acid (VITAMIN C) 500 MG tablet Take 500 mg by mouth daily.     Yes Historical Provider, MD  aspirin 81 MG chewable tablet Chew  81 mg by mouth daily.     Yes Historical Provider, MD  beta carotene w/minerals (OCUVITE) tablet Take 1 tablet by mouth daily.   Yes Historical Provider, MD  Calcium-Magnesium-Vitamin D (CALCIUM 500 PO) Take 500 mg by mouth daily.   Yes Historical Provider, MD  clonazePAM (KLONOPIN) 1 MG tablet Take 1 tablet (1 mg total) by mouth 2 (two) times daily as needed for anxiety. For anxiety 03/27/13  Yes Kelvin Cellar, MD  fenofibrate 160 MG tablet Take 160 mg by mouth daily.   Yes Historical Provider, MD  fluticasone (FLONASE) 50 MCG/ACT nasal spray Place 2 sprays into the nose daily.   Yes Historical Provider, MD  folic acid (FOLVITE) 1 MG tablet Take 1 mg by mouth 2 (two) times daily.     Yes Historical Provider, MD  hydrALAZINE (APRESOLINE) 50 MG tablet Take 50 mg by mouth 4 (four) times daily.    Yes Historical Provider, MD  lisinopril (PRINIVIL,ZESTRIL) 40 MG tablet Take 40 mg by mouth daily.   Yes Historical Provider, MD  metoCLOPramide (REGLAN) 5 MG tablet Take 1 tablet (5 mg total) by mouth 4 (four) times daily -  before meals and at bedtime. 03/27/13  Yes Kelvin Cellar, MD  Multiple Vitamins-Minerals (MULTIVITAMINS THER. W/MINERALS) TABS Take 1 tablet by mouth daily.     Yes Historical Provider, MD  omeprazole (PRILOSEC) 20 MG capsule Take 20 mg by mouth daily.     Yes Historical Provider, MD  polyethylene glycol (MIRALAX / GLYCOLAX) packet Take 17 g by mouth daily.   Yes Historical Provider, MD  propranolol (INDERAL) 60 MG tablet Take 120 mg by mouth at bedtime.    Yes Historical Provider, MD  rosuvastatin (CRESTOR) 20 MG tablet Take 20 mg by mouth daily.   Yes Historical Provider, MD  vitamin E (VITAMIN E) 400 UNIT capsule Take 400 Units by mouth daily.     Yes Historical Provider, MD      Allergies  Allergen Reactions  . Gabapentin Other (See Comments)    headaches  . Prednisone Other (See Comments)    Headaches-all steroids  . Topiramate Other (See Comments)    Makes headaches  worse  . Wellbutrin [Bupropion Hcl] Other (See Comments)    Caused headaches    ROS:  Out of a complete 14 system review of symptoms, the patient complains only of the following symptoms, and all other reviewed systems are negative.  Hearing loss, difficulty swallowing Constipation Easy bruising, easy bleeding Feeling cold Joint pain Allergies Memory loss, headache, numbness, difficulty swallowing Anxiety  Blood pressure 148/58, pulse 70, height 5\' 2"  (1.575 m), weight 128 lb (58.06 kg).  Physical Exam  General: The patient is alert and cooperative at the time of the  examination.  Eyes: Pupils are equal, round, and reactive to light. Discs are flat bilaterally.  Neck: The neck is supple, no carotid bruits are noted.  Respiratory: The respiratory examination is clear.  Cardiovascular: The cardiovascular examination reveals a regular rate and rhythm, no obvious murmurs or rubs are noted.  Skin: Extremities are without significant edema.  Neurologic Exam  Mental status: The patient is alert and oriented x 2 at the time of the examination (not oriented to date). The Mini-Mental Status Examination done today shows a total score 22/30. The patient is able to name 10 animals in 30 seconds.  Cranial nerves: Facial symmetry is present. There is good sensation of the face to pinprick and soft touch bilaterally. The strength of the facial muscles and the muscles to head turning and shoulder shrug are normal bilaterally. Speech is well enunciated, no aphasia or dysarthria is noted. Extraocular movements are full. Visual fields are full. The tongue is midline, and the patient has symmetric elevation of the soft palate. No obvious hearing deficits are noted.  Motor: The motor testing reveals 5 over 5 strength of all 4 extremities. Good symmetric motor tone is noted throughout.  Sensory: Sensory testing is intact to pinprick, soft touch, vibration sensation, and position sense on all 4  extremities. No evidence of extinction is noted.  Coordination: Cerebellar testing reveals good finger-nose-finger and heel-to-shin bilaterally.  Gait and station: Gait is unstable, the patient takes short shuffling steps. Tandem gait was not attempted. Romberg is negative. No drift is seen.  Reflexes: Deep tendon reflexes are symmetric, but are depressed bilaterally. Toes are downgoing bilaterally.   CT head/ cervical 05/27/15:   IMPRESSION: CT head: Small stable posterior right parafalcine arachnoid cyst, a benign finding. No intracranial hemorrhage or extra-axial fluid collection. No new mass. Gray-white compartments appear normal.  CT cervical spine: No fracture. Slight spondylolisthesis at C4-5 due to underlying spondylosis. No other spondylolisthesis. Multilevel arthropathy. Calcification in both carotid arteries as well as in the distal left vertebral artery.  * CT scan images were reviewed online. I agree with the written report.   Assessment/Plan:  1. Paranoid/delusional thinking  The patient has an anxiety disorder, and she has had some issues with believing that a family within her church is creating rumors about her. This has been a thought pattern that has been persistent over time. This may be a difficult issue to treat, I would recommend that she be tapered off of the amitriptyline slowly by 10 mg increments and switched to an SSRI antidepressant. I would also recommend a referral to a psychologist or counselor to work through the issues that she is dealing with. Paranoid and delusional thinking can be associated with a dementia. The family believes that her memory is relatively normal, but she is scoring 22/30 on the Mini-Mental Status Examination suggesting a mild dementia.   Tamara Alexanders MD 07/22/2015 6:57 PM  [facility] [address] [place], [place] [address]  Phone [phone] Fax [phone]

## 2015-08-24 ENCOUNTER — Telehealth: Payer: Self-pay | Admitting: Neurology

## 2015-08-24 MED ORDER — AMITRIPTYLINE HCL 10 MG PO TABS: ORAL_TABLET | ORAL | Status: DC

## 2015-08-24 NOTE — Telephone Encounter (Signed)
Pt's daughter in law, Jackelyn Poling ,  called stating that the pt is having a terrible time with her headaches since coming off of amitriptyline (ELAVIL) 25 MG tablet. They do not want to take her off and would like a rx sent to the pharm. They would like to take both the amitriptyline and the celexa. The daughter in law states Dr. Tollie Pizza has been prescribing these medication but was told our office would need to. Please call [phone]

## 2015-08-24 NOTE — Telephone Encounter (Signed)
I called the daughter. The patient has had increased headaches, not doing well coming down rapidly on the amitriptyline. I will call in a prescription for the 10 mg tablets, start back at 50 mg at night for 4 weeks, and then taper by 10 mg every 4 weeks. If she starts having troubles with the taper, we may have to keep her on the medication.

## 2015-09-02 DIAGNOSIS — E119 Type 2 diabetes mellitus without complications: Secondary | ICD-10-CM | POA: Diagnosis not present

## 2015-09-02 DIAGNOSIS — G44229 Chronic tension-type headache, not intractable: Secondary | ICD-10-CM | POA: Diagnosis not present

## 2015-09-02 DIAGNOSIS — G8929 Other chronic pain: Secondary | ICD-10-CM | POA: Diagnosis not present

## 2015-09-02 DIAGNOSIS — M545 Low back pain: Secondary | ICD-10-CM | POA: Diagnosis not present

## 2015-09-15 DIAGNOSIS — R1084 Generalized abdominal pain: Secondary | ICD-10-CM | POA: Diagnosis not present

## 2015-09-15 DIAGNOSIS — M545 Low back pain: Secondary | ICD-10-CM | POA: Diagnosis not present

## 2015-09-15 DIAGNOSIS — G8929 Other chronic pain: Secondary | ICD-10-CM | POA: Diagnosis not present

## 2015-10-19 DIAGNOSIS — M542 Cervicalgia: Secondary | ICD-10-CM | POA: Diagnosis not present

## 2015-10-19 DIAGNOSIS — G8929 Other chronic pain: Secondary | ICD-10-CM | POA: Diagnosis not present

## 2015-10-19 DIAGNOSIS — G44229 Chronic tension-type headache, not intractable: Secondary | ICD-10-CM | POA: Diagnosis not present

## 2015-10-21 DIAGNOSIS — M5030 Other cervical disc degeneration, unspecified cervical region: Secondary | ICD-10-CM | POA: Diagnosis not present

## 2015-11-02 DIAGNOSIS — H353131 Nonexudative age-related macular degeneration, bilateral, early dry stage: Secondary | ICD-10-CM | POA: Diagnosis not present

## 2015-11-02 DIAGNOSIS — H01024 Squamous blepharitis left upper eyelid: Secondary | ICD-10-CM | POA: Diagnosis not present

## 2015-11-02 DIAGNOSIS — H1859 Other hereditary corneal dystrophies: Secondary | ICD-10-CM | POA: Diagnosis not present

## 2015-11-02 DIAGNOSIS — H01022 Squamous blepharitis right lower eyelid: Secondary | ICD-10-CM | POA: Diagnosis not present

## 2015-11-02 DIAGNOSIS — H04123 Dry eye syndrome of bilateral lacrimal glands: Secondary | ICD-10-CM | POA: Diagnosis not present

## 2015-11-02 DIAGNOSIS — H01025 Squamous blepharitis left lower eyelid: Secondary | ICD-10-CM | POA: Diagnosis not present

## 2015-11-02 DIAGNOSIS — H01021 Squamous blepharitis right upper eyelid: Secondary | ICD-10-CM | POA: Diagnosis not present

## 2015-11-02 DIAGNOSIS — Z961 Presence of intraocular lens: Secondary | ICD-10-CM | POA: Diagnosis not present

## 2015-11-03 DIAGNOSIS — R1084 Generalized abdominal pain: Secondary | ICD-10-CM | POA: Diagnosis not present

## 2015-11-03 DIAGNOSIS — G44229 Chronic tension-type headache, not intractable: Secondary | ICD-10-CM | POA: Diagnosis not present

## 2015-11-03 DIAGNOSIS — M25571 Pain in right ankle and joints of right foot: Secondary | ICD-10-CM | POA: Diagnosis not present

## 2015-11-03 DIAGNOSIS — M7989 Other specified soft tissue disorders: Secondary | ICD-10-CM | POA: Diagnosis not present

## 2015-11-03 DIAGNOSIS — M25561 Pain in right knee: Secondary | ICD-10-CM | POA: Diagnosis not present

## 2015-11-03 DIAGNOSIS — E782 Mixed hyperlipidemia: Secondary | ICD-10-CM | POA: Diagnosis not present

## 2015-11-03 DIAGNOSIS — R7301 Impaired fasting glucose: Secondary | ICD-10-CM | POA: Diagnosis not present

## 2015-11-03 DIAGNOSIS — I10 Essential (primary) hypertension: Secondary | ICD-10-CM | POA: Diagnosis not present

## 2015-11-08 DIAGNOSIS — M542 Cervicalgia: Secondary | ICD-10-CM | POA: Diagnosis not present

## 2015-11-08 DIAGNOSIS — G44229 Chronic tension-type headache, not intractable: Secondary | ICD-10-CM | POA: Diagnosis not present

## 2015-11-08 DIAGNOSIS — M545 Low back pain: Secondary | ICD-10-CM | POA: Diagnosis not present

## 2015-11-08 DIAGNOSIS — G8929 Other chronic pain: Secondary | ICD-10-CM | POA: Diagnosis not present

## 2015-11-18 DIAGNOSIS — M5030 Other cervical disc degeneration, unspecified cervical region: Secondary | ICD-10-CM | POA: Diagnosis not present

## 2015-11-24 DIAGNOSIS — I10 Essential (primary) hypertension: Secondary | ICD-10-CM | POA: Diagnosis not present

## 2015-11-24 DIAGNOSIS — R21 Rash and other nonspecific skin eruption: Secondary | ICD-10-CM | POA: Diagnosis not present

## 2015-11-24 DIAGNOSIS — K5901 Slow transit constipation: Secondary | ICD-10-CM | POA: Diagnosis not present

## 2015-12-02 DIAGNOSIS — M7989 Other specified soft tissue disorders: Secondary | ICD-10-CM | POA: Diagnosis not present

## 2015-12-02 DIAGNOSIS — M25572 Pain in left ankle and joints of left foot: Secondary | ICD-10-CM | POA: Diagnosis not present

## 2015-12-29 DIAGNOSIS — K5901 Slow transit constipation: Secondary | ICD-10-CM | POA: Diagnosis not present

## 2015-12-29 DIAGNOSIS — K219 Gastro-esophageal reflux disease without esophagitis: Secondary | ICD-10-CM | POA: Diagnosis not present

## 2015-12-29 DIAGNOSIS — R490 Dysphonia: Secondary | ICD-10-CM | POA: Diagnosis not present

## 2016-01-04 ENCOUNTER — Inpatient Hospital Stay (HOSPITAL_COMMUNITY): Payer: PPO

## 2016-01-04 ENCOUNTER — Inpatient Hospital Stay (HOSPITAL_COMMUNITY)
Admission: EM | Admit: 2016-01-04 | Discharge: 2016-01-11 | DRG: 070 | Disposition: A | Discharge status: Skilled Nursing Facility | Payer: PPO | Attending: Internal Medicine | Admitting: Internal Medicine

## 2016-01-04 ENCOUNTER — Encounter (HOSPITAL_COMMUNITY): Payer: Self-pay | Admitting: Emergency Medicine

## 2016-01-04 ENCOUNTER — Emergency Department (HOSPITAL_COMMUNITY): Payer: PPO

## 2016-01-04 DIAGNOSIS — E876 Hypokalemia: Secondary | ICD-10-CM | POA: Diagnosis present

## 2016-01-04 DIAGNOSIS — F0390 Unspecified dementia without behavioral disturbance: Secondary | ICD-10-CM | POA: Diagnosis not present

## 2016-01-04 DIAGNOSIS — J189 Pneumonia, unspecified organism: Secondary | ICD-10-CM | POA: Diagnosis not present

## 2016-01-04 DIAGNOSIS — Z66 Do not resuscitate: Secondary | ICD-10-CM | POA: Diagnosis present

## 2016-01-04 DIAGNOSIS — E785 Hyperlipidemia, unspecified: Secondary | ICD-10-CM | POA: Diagnosis not present

## 2016-01-04 DIAGNOSIS — I10 Essential (primary) hypertension: Secondary | ICD-10-CM | POA: Diagnosis not present

## 2016-01-04 DIAGNOSIS — Z85828 Personal history of other malignant neoplasm of skin: Secondary | ICD-10-CM | POA: Diagnosis not present

## 2016-01-04 DIAGNOSIS — R4182 Altered mental status, unspecified: Secondary | ICD-10-CM | POA: Diagnosis not present

## 2016-01-04 DIAGNOSIS — F22 Delusional disorders: Secondary | ICD-10-CM | POA: Diagnosis not present

## 2016-01-04 DIAGNOSIS — F329 Major depressive disorder, single episode, unspecified: Secondary | ICD-10-CM | POA: Diagnosis present

## 2016-01-04 DIAGNOSIS — F419 Anxiety disorder, unspecified: Secondary | ICD-10-CM | POA: Diagnosis present

## 2016-01-04 DIAGNOSIS — R51 Headache: Secondary | ICD-10-CM | POA: Diagnosis not present

## 2016-01-04 DIAGNOSIS — E1165 Type 2 diabetes mellitus with hyperglycemia: Secondary | ICD-10-CM | POA: Diagnosis not present

## 2016-01-04 DIAGNOSIS — E872 Acidosis: Secondary | ICD-10-CM | POA: Diagnosis present

## 2016-01-04 DIAGNOSIS — G43909 Migraine, unspecified, not intractable, without status migrainosus: Secondary | ICD-10-CM | POA: Diagnosis not present

## 2016-01-04 DIAGNOSIS — K5900 Constipation, unspecified: Secondary | ICD-10-CM | POA: Diagnosis not present

## 2016-01-04 DIAGNOSIS — K5901 Slow transit constipation: Secondary | ICD-10-CM | POA: Diagnosis not present

## 2016-01-04 DIAGNOSIS — E871 Hypo-osmolality and hyponatremia: Secondary | ICD-10-CM | POA: Diagnosis not present

## 2016-01-04 DIAGNOSIS — I248 Other forms of acute ischemic heart disease: Secondary | ICD-10-CM | POA: Diagnosis not present

## 2016-01-04 DIAGNOSIS — R519 Headache, unspecified: Secondary | ICD-10-CM | POA: Diagnosis present

## 2016-01-04 DIAGNOSIS — G9340 Encephalopathy, unspecified: Secondary | ICD-10-CM | POA: Diagnosis not present

## 2016-01-04 DIAGNOSIS — F061 Catatonic disorder due to known physiological condition: Secondary | ICD-10-CM | POA: Diagnosis not present

## 2016-01-04 DIAGNOSIS — R000 Tachycardia, unspecified: Secondary | ICD-10-CM | POA: Diagnosis not present

## 2016-01-04 DIAGNOSIS — R531 Weakness: Secondary | ICD-10-CM | POA: Diagnosis not present

## 2016-01-04 DIAGNOSIS — K219 Gastro-esophageal reflux disease without esophagitis: Secondary | ICD-10-CM | POA: Diagnosis present

## 2016-01-04 DIAGNOSIS — H3530 Unspecified macular degeneration: Secondary | ICD-10-CM | POA: Diagnosis present

## 2016-01-04 DIAGNOSIS — R1032 Left lower quadrant pain: Secondary | ICD-10-CM | POA: Diagnosis not present

## 2016-01-04 DIAGNOSIS — Z8659 Personal history of other mental and behavioral disorders: Secondary | ICD-10-CM | POA: Diagnosis not present

## 2016-01-04 DIAGNOSIS — F202 Catatonic schizophrenia: Secondary | ICD-10-CM | POA: Diagnosis not present

## 2016-01-04 DIAGNOSIS — I6789 Other cerebrovascular disease: Secondary | ICD-10-CM | POA: Diagnosis not present

## 2016-01-04 DIAGNOSIS — I471 Supraventricular tachycardia: Secondary | ICD-10-CM | POA: Diagnosis not present

## 2016-01-04 DIAGNOSIS — R079 Chest pain, unspecified: Secondary | ICD-10-CM | POA: Diagnosis not present

## 2016-01-04 DIAGNOSIS — Z79899 Other long term (current) drug therapy: Secondary | ICD-10-CM | POA: Diagnosis not present

## 2016-01-04 DIAGNOSIS — R4781 Slurred speech: Secondary | ICD-10-CM | POA: Diagnosis not present

## 2016-01-04 DIAGNOSIS — G4489 Other headache syndrome: Secondary | ICD-10-CM | POA: Diagnosis not present

## 2016-01-04 DIAGNOSIS — R109 Unspecified abdominal pain: Secondary | ICD-10-CM

## 2016-01-04 LAB — CBC WITH DIFFERENTIAL/PLATELET
Basophils Absolute: 0 10*3/uL (ref 0.0–0.1)
Basophils Relative: 0 %
EOS ABS: 0 10*3/uL (ref 0.0–0.7)
EOS PCT: 0 %
HCT: 39.9 % (ref 36.0–46.0)
HEMOGLOBIN: 13.6 g/dL (ref 12.0–15.0)
LYMPHS ABS: 1.1 10*3/uL (ref 0.7–4.0)
Lymphocytes Relative: 6 %
MCH: 29.3 pg (ref 26.0–34.0)
MCHC: 34.1 g/dL (ref 30.0–36.0)
MCV: 86 fL (ref 78.0–100.0)
MONO ABS: 2.3 10*3/uL — AB (ref 0.1–1.0)
MONOS PCT: 12 %
NEUTROS PCT: 82 %
Neutro Abs: 15.3 10*3/uL — ABNORMAL HIGH (ref 1.7–7.7)
Platelets: 275 10*3/uL (ref 150–400)
RBC: 4.64 MIL/uL (ref 3.87–5.11)
RDW: 15.5 % (ref 11.5–15.5)
WBC: 18.6 10*3/uL — ABNORMAL HIGH (ref 4.0–10.5)

## 2016-01-04 LAB — I-STAT CG4 LACTIC ACID, ED: Lactic Acid, Venous: 2.08 mmol/L (ref 0.5–1.9)

## 2016-01-04 LAB — COMPREHENSIVE METABOLIC PANEL
ALBUMIN: 4 g/dL (ref 3.5–5.0)
ALK PHOS: 37 U/L — AB (ref 38–126)
ALT: 16 U/L (ref 14–54)
AST: 32 U/L (ref 15–41)
Anion gap: 19 — ABNORMAL HIGH (ref 5–15)
BILIRUBIN TOTAL: 1.2 mg/dL (ref 0.3–1.2)
BUN: 21 mg/dL — AB (ref 6–20)
CALCIUM: 10.4 mg/dL — AB (ref 8.9–10.3)
CO2: 22 mmol/L (ref 22–32)
Chloride: 92 mmol/L — ABNORMAL LOW (ref 101–111)
Creatinine, Ser: 1.1 mg/dL — ABNORMAL HIGH (ref 0.44–1.00)
GFR calc Af Amer: 50 mL/min — ABNORMAL LOW (ref >60)
GFR, EST NON AFRICAN AMERICAN: 44 mL/min — AB (ref >60)
GLUCOSE: 180 mg/dL — AB (ref 65–99)
POTASSIUM: 3 mmol/L — AB (ref 3.5–5.1)
Sodium: 133 mmol/L — ABNORMAL LOW (ref 135–145)
TOTAL PROTEIN: 7.9 g/dL (ref 6.5–8.1)

## 2016-01-04 LAB — CK: Total CK: 60 U/L (ref 38–234)

## 2016-01-04 LAB — URINALYSIS, ROUTINE W REFLEX MICROSCOPIC
BILIRUBIN URINE: NEGATIVE
GLUCOSE, UA: NEGATIVE mg/dL
HGB URINE DIPSTICK: NEGATIVE
KETONES UR: 15 mg/dL — AB
Leukocytes, UA: NEGATIVE
Nitrite: NEGATIVE
PH: 6.5 (ref 5.0–8.0)
Protein, ur: >300 mg/dL — AB
SPECIFIC GRAVITY, URINE: 1.02 (ref 1.005–1.030)

## 2016-01-04 LAB — I-STAT TROPONIN, ED: TROPONIN I, POC: 0.16 ng/mL — AB (ref 0.00–0.08)

## 2016-01-04 LAB — I-STAT CHEM 8, ED
BUN: 21 mg/dL — AB (ref 6–20)
CHLORIDE: 93 mmol/L — AB (ref 101–111)
Calcium, Ion: 1.2 mmol/L (ref 1.12–1.23)
Creatinine, Ser: 0.8 mg/dL (ref 0.44–1.00)
GLUCOSE: 186 mg/dL — AB (ref 65–99)
HCT: 40 % (ref 36.0–46.0)
Hemoglobin: 13.6 g/dL (ref 12.0–15.0)
POTASSIUM: 3.1 mmol/L — AB (ref 3.5–5.1)
Sodium: 136 mmol/L (ref 135–145)
TCO2: 26 mmol/L (ref 0–100)

## 2016-01-04 LAB — ACETAMINOPHEN LEVEL: Acetaminophen (Tylenol), Serum: <10 ug/mL — ABNORMAL LOW (ref 10–30)

## 2016-01-04 LAB — APTT: APTT: 28 s (ref 24–36)

## 2016-01-04 LAB — RAPID URINE DRUG SCREEN, HOSP PERFORMED
Amphetamines: NOT DETECTED
BARBITURATES: NOT DETECTED
Benzodiazepines: NOT DETECTED
Cocaine: NOT DETECTED
Opiates: NOT DETECTED
TETRAHYDROCANNABINOL: NOT DETECTED

## 2016-01-04 LAB — TYPE AND SCREEN
ABO/RH(D): A POS
ANTIBODY SCREEN: NEGATIVE

## 2016-01-04 LAB — LIPASE, BLOOD: Lipase: 27 U/L (ref 11–51)

## 2016-01-04 LAB — URINE MICROSCOPIC-ADD ON: BACTERIA UA: NONE SEEN

## 2016-01-04 LAB — TROPONIN I: Troponin I: 0.15 ng/mL (ref <0.03)

## 2016-01-04 LAB — AMMONIA: AMMONIA: 26 umol/L (ref 9–35)

## 2016-01-04 LAB — LACTIC ACID, PLASMA: Lactic Acid, Venous: 2 mmol/L (ref 0.5–1.9)

## 2016-01-04 LAB — PROTIME-INR
INR: 1.02
PROTHROMBIN TIME: 13.4 s (ref 11.4–15.2)

## 2016-01-04 LAB — CBG MONITORING, ED: GLUCOSE-CAPILLARY: 182 mg/dL — AB (ref 65–99)

## 2016-01-04 LAB — PROCALCITONIN: Procalcitonin: <0.1 ng/mL

## 2016-01-04 LAB — GLUCOSE, CAPILLARY: Glucose-Capillary: 188 mg/dL — ABNORMAL HIGH (ref 65–99)

## 2016-01-04 LAB — SALICYLATE LEVEL

## 2016-01-04 LAB — BRAIN NATRIURETIC PEPTIDE: B NATRIURETIC PEPTIDE 5: 306 pg/mL — AB (ref 0.0–100.0)

## 2016-01-04 LAB — TSH: TSH: 0.765 u[IU]/mL (ref 0.350–4.500)

## 2016-01-04 MED ORDER — LABETALOL HCL 5 MG/ML IV SOLN: 10.0000 mg | INTRAVENOUS | Status: DC | PRN

## 2016-01-04 MED ORDER — POLYETHYLENE GLYCOL 3350 17 G PO PACK
34.0000 g | PACK | Freq: Every day | ORAL | Status: DC
  Administered 2016-01-05 – 2016-01-11 (×3): 34 g via ORAL
  Filled 2016-01-04 (×5): qty 2

## 2016-01-04 MED ORDER — SODIUM CHLORIDE 0.9 % IV SOLN
INTRAVENOUS | Status: DC
  Administered 2016-01-04 – 2016-01-06 (×3): via INTRAVENOUS

## 2016-01-04 MED ORDER — PIPERACILLIN-TAZOBACTAM 3.375 G IVPB
3.3750 g | Freq: Three times a day (TID) | INTRAVENOUS | Status: DC
  Administered 2016-01-05 – 2016-01-10 (×16): 3.375 g via INTRAVENOUS
  Filled 2016-01-04 (×19): qty 50

## 2016-01-04 MED ORDER — OCUVITE-LUTEIN PO CAPS
1.0000 | ORAL_CAPSULE | Freq: Every day | ORAL | Status: DC
  Filled 2016-01-04: qty 1

## 2016-01-04 MED ORDER — SODIUM CHLORIDE 0.9 % IV BOLUS (SEPSIS)
1500.0000 mL | Freq: Once | INTRAVENOUS | Status: AC
  Administered 2016-01-04: 1500 mL via INTRAVENOUS

## 2016-01-04 MED ORDER — ONDANSETRON HCL 4 MG/2ML IJ SOLN
INTRAMUSCULAR | Status: AC
  Filled 2016-01-04: qty 2

## 2016-01-04 MED ORDER — SODIUM CHLORIDE 0.9 % IV BOLUS (SEPSIS)
500.0000 mL | Freq: Once | INTRAVENOUS | Status: AC
  Administered 2016-01-04: 500 mL via INTRAVENOUS

## 2016-01-04 MED ORDER — HYDRALAZINE HCL 20 MG/ML IJ SOLN
10.0000 mg | INTRAMUSCULAR | Status: DC | PRN
  Administered 2016-01-05 – 2016-01-11 (×9): 10 mg via INTRAVENOUS
  Filled 2016-01-04 (×10): qty 1

## 2016-01-04 MED ORDER — VANCOMYCIN HCL IN DEXTROSE 1-5 GM/200ML-% IV SOLN
1000.0000 mg | INTRAVENOUS | Status: DC
  Administered 2016-01-05 – 2016-01-08 (×4): 1000 mg via INTRAVENOUS
  Filled 2016-01-04 (×5): qty 200

## 2016-01-04 MED ORDER — HYDROCHLOROTHIAZIDE 25 MG PO TABS: 25.0000 mg | ORAL_TABLET | Freq: Every day | ORAL | Status: DC

## 2016-01-04 MED ORDER — ASPIRIN 325 MG PO TABS
325.0000 mg | ORAL_TABLET | Freq: Every day | ORAL | Status: DC
  Administered 2016-01-05 – 2016-01-11 (×5): 325 mg via ORAL
  Filled 2016-01-04 (×7): qty 1

## 2016-01-04 MED ORDER — VANCOMYCIN HCL 10 G IV SOLR
1250.0000 mg | Freq: Once | INTRAVENOUS | Status: AC
  Administered 2016-01-05: 1250 mg via INTRAVENOUS
  Filled 2016-01-04: qty 1250

## 2016-01-04 MED ORDER — ONDANSETRON HCL 4 MG/2ML IJ SOLN
4.0000 mg | Freq: Four times a day (QID) | INTRAMUSCULAR | Status: DC | PRN
  Administered 2016-01-05 – 2016-01-11 (×4): 4 mg via INTRAVENOUS
  Filled 2016-01-04 (×4): qty 2

## 2016-01-04 MED ORDER — ACETAMINOPHEN 650 MG RE SUPP: 650.0000 mg | RECTAL | Status: DC | PRN

## 2016-01-04 MED ORDER — PROPRANOLOL HCL 60 MG PO TABS: 120.0000 mg | ORAL_TABLET | Freq: Every day | ORAL | Status: DC

## 2016-01-04 MED ORDER — ASPIRIN 300 MG RE SUPP
300.0000 mg | Freq: Every day | RECTAL | Status: DC
  Filled 2016-01-04: qty 1

## 2016-01-04 MED ORDER — LISINOPRIL 40 MG PO TABS
40.0000 mg | ORAL_TABLET | Freq: Every day | ORAL | Status: DC
  Administered 2016-01-05 – 2016-01-11 (×5): 40 mg via ORAL
  Filled 2016-01-04 (×7): qty 1

## 2016-01-04 MED ORDER — GADOBENATE DIMEGLUMINE 529 MG/ML IV SOLN
15.0000 mL | Freq: Once | INTRAVENOUS | Status: AC
  Administered 2016-01-04: 14 mL via INTRAVENOUS

## 2016-01-04 MED ORDER — ENOXAPARIN SODIUM 40 MG/0.4ML ~~LOC~~ SOLN
40.0000 mg | Freq: Every day | SUBCUTANEOUS | Status: DC
  Administered 2016-01-04 – 2016-01-10 (×6): 40 mg via SUBCUTANEOUS
  Filled 2016-01-04 (×8): qty 0.4

## 2016-01-04 MED ORDER — SENNOSIDES-DOCUSATE SODIUM 8.6-50 MG PO TABS: 1.0000 | ORAL_TABLET | Freq: Every evening | ORAL | Status: DC | PRN

## 2016-01-04 MED ORDER — ACETAMINOPHEN 325 MG PO TABS
650.0000 mg | ORAL_TABLET | ORAL | Status: DC | PRN
  Administered 2016-01-05 – 2016-01-08 (×4): 650 mg via ORAL
  Filled 2016-01-04 (×4): qty 2

## 2016-01-04 MED ORDER — OCUVITE PO TABS: 1.0000 | ORAL_TABLET | Freq: Every day | ORAL | Status: DC

## 2016-01-04 MED ORDER — HYDRALAZINE HCL 50 MG PO TABS: 50.0000 mg | ORAL_TABLET | Freq: Four times a day (QID) | ORAL | Status: DC

## 2016-01-04 MED ORDER — PIPERACILLIN-TAZOBACTAM 3.375 G IVPB 30 MIN
3.3750 g | Freq: Once | INTRAVENOUS | Status: AC
  Administered 2016-01-05: 3.375 g via INTRAVENOUS
  Filled 2016-01-04: qty 50

## 2016-01-04 MED ORDER — ONDANSETRON HCL 4 MG/2ML IJ SOLN
4.0000 mg | Freq: Once | INTRAMUSCULAR | Status: AC
  Administered 2016-01-04: 4 mg via INTRAVENOUS

## 2016-01-04 MED ORDER — PROPRANOLOL HCL ER 120 MG PO CP24
120.0000 mg | ORAL_CAPSULE | Freq: Every day | ORAL | Status: DC
  Filled 2016-01-04: qty 1

## 2016-01-04 MED ORDER — POTASSIUM CHLORIDE 10 MEQ/100ML IV SOLN
10.0000 meq | INTRAVENOUS | Status: AC
  Administered 2016-01-04 – 2016-01-05 (×4): 10 meq via INTRAVENOUS
  Filled 2016-01-04: qty 100

## 2016-01-04 MED ORDER — STROKE: EARLY STAGES OF RECOVERY BOOK
Freq: Once | Status: DC
  Filled 2016-01-04: qty 1

## 2016-01-04 MED ORDER — BISACODYL 10 MG RE SUPP: 10.0000 mg | Freq: Every day | RECTAL | Status: DC | PRN

## 2016-01-04 MED ORDER — AMLODIPINE BESYLATE 5 MG PO TABS
5.0000 mg | ORAL_TABLET | Freq: Every day | ORAL | Status: DC
  Administered 2016-01-05 – 2016-01-06 (×2): 5 mg via ORAL
  Filled 2016-01-04 (×2): qty 1

## 2016-01-04 MED ORDER — CLONAZEPAM 1 MG PO TABS
1.0000 mg | ORAL_TABLET | Freq: Every day | ORAL | Status: DC
  Filled 2016-01-04: qty 1

## 2016-01-04 MED ORDER — MORPHINE SULFATE (PF) 2 MG/ML IV SOLN
2.0000 mg | Freq: Once | INTRAVENOUS | Status: AC
  Administered 2016-01-04: 2 mg via INTRAVENOUS
  Filled 2016-01-04: qty 1

## 2016-01-04 MED ORDER — MORPHINE SULFATE (PF) 2 MG/ML IV SOLN
2.0000 mg | INTRAVENOUS | Status: DC | PRN
  Administered 2016-01-04 – 2016-01-11 (×4): 2 mg via INTRAVENOUS
  Filled 2016-01-04 (×4): qty 1

## 2016-01-04 MED ORDER — LABETALOL HCL 5 MG/ML IV SOLN
10.0000 mg | INTRAVENOUS | Status: DC | PRN
  Administered 2016-01-04 – 2016-01-10 (×6): 10 mg via INTRAVENOUS
  Filled 2016-01-04 (×6): qty 4

## 2016-01-04 MED ORDER — ONDANSETRON HCL 4 MG/2ML IJ SOLN
4.0000 mg | Freq: Once | INTRAMUSCULAR | Status: AC
  Administered 2016-01-04: 4 mg via INTRAVENOUS
  Filled 2016-01-04: qty 2

## 2016-01-04 NOTE — H&P (Addendum)
History and Physical    JOHNANNA Arnold [id number] DOB: 12/16/1925 DOA: 01/04/2016  PCP: Stephens Shire, MD Consultants:  None Patient coming from: home - lives alone; NOK: Gwenlyn Found, [phone]  Chief Complaint: unresponsiveness  HPI: Tamara Arnold is a 82 y.o. female with medical history significant of chronic severe headaches and some paranoia/psychiatric illness in her later years but overall clear mental status despite her age presenting in a catatonic state.  Family is present with her and is able to provide the history, as the patient is still unable to fully communicate (but improved).  She has a family caregiver a few mornings a week.  The caregiver went over today and the patient was sitting in a chair and appeared unable to talk at all.  Patient "came out of it" but was pointing to her head (40 years of severe headaches).  The caregiver tried to get her to eat and take Tramadol - none since previous night.  Has been taking Tramadol for several months with no prior problem.  She was able to eat oatmeal and was acting better other than the pain.  She did vomit this AM prior to eating, about 0900-1000.  She took a nap in her chair.  The caregiver called the patient's daughter-in-law at about 1330; she appeared to be awake but was not responding.  DIL came over and the patient would not respond at all.  No talking, glazed look staring off in the distance.  She did try to shake her head "no" a couple of times but otherwise completely unresponsive.   Not moving at all, did not seem to favor one side or the other.  No known emesis since other than 1 episode in the ER and currently appears to be nauseated.     Family reports that she did have a really bad day on Saturday.  Was telling son she thought she was going to die.  Stomach, head hurting, legs felt like lead - all normal for her in these situations.  Took Tramadol with some relief and went to bed that night.    Family has noticed  some overall functional decline recently and are beginning to have some concerns about her ability to remain living in her home alone.  Occasionally will go to take medication and it will fall out of her mouth without her noticing.  Her memory, however, has been excellent - she still knows the names and DOB of her 60+ great-grandchildren!   ED Course:  Patient found to be catatonic, uncertain etiology.  Neuro exam per PA-C Dansie: Patient is catatonic. She is alert and will often track with her eyes when spoken to. She has good strength to her upper and lower extremities and will hold her arms and legs up when placed there. She is not speaking and does nto follow commands.   Thorough lab evaluation, CT negative, given IVF, morphine, Zofran.  Review of Systems: As per HPI; otherwise 10 point review of systems reviewed and negative.   Ambulatory Status:  Ambulates with a walker independently  Past Medical History:  Diagnosis Date  . Anemia   . Anxiety   . Arthritis    "neck, back; none in my knees" (03/20/2013)  . Chronic lower back pain   . Daily headache    "all the time for 37 yrs" (03/20/2013)  . Depression   . GERD (gastroesophageal reflux disease)   . History of blood transfusion 02/2013   "related to this recent back  OR" (03/20/2013)  . Hyperlipidemia   . Hypertension   . Macular degeneration of both eyes   . Migraine    "not migraines/neurologist" (09/12/2012)  . Shortness of breath    "lately cause my stomach is swollen" (03/20/2013)  . Skin cancer    "on my face alot; had one cut off left leg before; (03/20/2013)  . Type II diabetes mellitus ([place])    off all meds now    Past Surgical History:  Procedure Laterality Date  . ABDOMINAL HYSTERECTOMY    . APPENDECTOMY  1934  . BACK SURGERY  03/10/2013   [facility]-spinal stenosis  . CARPAL TUNNEL RELEASE Right 12/03/2013   Procedure: RIGHT CARPAL TUNNEL RELEASE;  Surgeon: Wynonia Sours, MD;  Location: [facility];   Service: Orthopedics;  Laterality: Right;  . CATARACT EXTRACTION W/ INTRAOCULAR LENS  IMPLANT, BILATERAL Bilateral   . CHOLECYSTECTOMY  1970's?  . CYSTECTOMY     "top of my head; removed @ [facility]; it was benign" (09/12/2012)  . HERNIA REPAIR    . SKIN CANCER EXCISION     [facility]  . TRIGGER FINGER RELEASE Right 12/03/2013   Procedure: RELEASE TRIGGER FINGER/A-1 PULLEY SMALL FINGER;  Surgeon: Wynonia Sours, MD;  Location: [facility];  Service: Orthopedics;  Laterality: Right;  . UMBILICAL HERNIA REPAIR      Social History   Social History  . Marital status: Widowed    Spouse name: N/A  . Number of children: 4  . Years of education: 67   Occupational History  . retired    Social History Main Topics  . Smoking status: Never Smoker  . Smokeless tobacco: Never Used  . Alcohol use No  . Drug use: No  . Sexual activity: No   Other Topics Concern  . Not on file   Social History Narrative   Lives alone   No caffeine use    Allergies  Allergen Reactions  . Gabapentin Other (See Comments)    headaches  . Prednisone Other (See Comments)    Headaches-all steroids  . Topiramate Other (See Comments)    Makes headaches worse  . Wellbutrin [Bupropion Hcl] Other (See Comments)    Caused headaches    Family History  Problem Relation Age of Onset  . Diabetes type II Father     Prior to Admission medications   Medication Sig Start Date End Date Taking? Authorizing Provider  acetaminophen (TYLENOL) 325 MG tablet Take 650 mg by mouth 3 (three) times daily as needed for mild pain or headache.    Yes Historical Provider, MD  amitriptyline (ELAVIL) 25 MG tablet Take 50 mg by mouth at bedtime. 12/29/15  Yes Historical Provider, MD  amLODipine (NORVASC) 5 MG tablet Take 5 mg by mouth daily.    Yes Historical Provider, MD  ascorbic acid (VITAMIN C) 500 MG tablet Take 500 mg by mouth every evening.    Yes Historical Provider, MD  beta carotene w/minerals (OCUVITE) tablet Take 1  tablet by mouth daily.   Yes Historical Provider, MD  Calcium Carb-Cholecalciferol (CALCIUM 600+D) 600-800 MG-UNIT TABS Take 1 tablet by mouth every evening.   Yes Historical Provider, MD  clonazePAM (KLONOPIN) 1 MG tablet Take 1 tablet (1 mg total) by mouth 2 (two) times daily as needed for anxiety. For anxiety Patient taking differently: Take 1 mg by mouth at bedtime. For anxiety 03/27/13  Yes Kelvin Cellar, MD  fenofibrate 160 MG tablet Take 160 mg by mouth daily.  Yes Historical Provider, MD  ferrous sulfate 325 (65 FE) MG tablet Take 325 mg by mouth daily at 12 noon.   Yes Historical Provider, MD  fluticasone (FLONASE) 50 MCG/ACT nasal spray Place 2 sprays into the nose daily as needed for allergies (Seasonal).    Yes Historical Provider, MD  folic acid (FOLVITE) 1 MG tablet Take 1 mg by mouth 2 (two) times daily.     Yes Historical Provider, MD  hydrALAZINE (APRESOLINE) 50 MG tablet Take 50 mg by mouth 4 (four) times daily.    Yes Historical Provider, MD  hydrochlorothiazide (HYDRODIURIL) 25 MG tablet Take 25 mg by mouth every evening. 12/27/15  Yes Historical Provider, MD  lisinopril (PRINIVIL,ZESTRIL) 40 MG tablet Take 40 mg by mouth daily.   Yes Historical Provider, MD  Multiple Vitamins-Minerals (MULTIVITAMINS THER. W/MINERALS) TABS Take 1 tablet by mouth daily.     Yes Historical Provider, MD  omeprazole (PRILOSEC) 20 MG capsule Take 20 mg by mouth 2 (two) times daily before a meal.    Yes Historical Provider, MD  polyethylene glycol (MIRALAX / GLYCOLAX) packet Take 34 g by mouth daily.    Yes Historical Provider, MD  propranolol (INDERAL) 60 MG tablet Take 120 mg by mouth at bedtime.    Yes Historical Provider, MD  traMADol (ULTRAM) 50 MG tablet Take 50 mg by mouth 3 (three) times daily as needed for moderate pain.  12/15/15  Yes Historical Provider, MD  vitamin E (VITAMIN E) 400 UNIT capsule Take 400 Units by mouth daily.     Yes Historical Provider, MD    Physical Exam: Vitals:    01/04/16 1915 01/04/16 1930 01/04/16 1945 01/04/16 2015  BP: 191/84 189/81 195/70 192/86  Pulse: 115 114 112 111  Resp: (!) 39 (!) 36 23 24  Temp:      TempSrc:      SpO2: 94% 95% 94% 94%  Weight:      Height:         General: Appears calm and comfortable and is NAD.  Generally lying perfectly still with eyes open intermittently. Eyes:  PERRL, EOMI, normal lids, iris ENT:  grossly normal hearing, lips & tongue, mmm Neck:  no LAD, masses or thyromegaly Cardiovascular: Tachycardia, no m/r/g. No LE edema.  Respiratory: CTA bilaterally, no w/r/r. Normal respiratory effort. Abdomen:  soft, ntnd, NABS Skin:  no rash or induration seen on limited exam Musculoskeletal:  grossly normal tone BUE/BLE, good ROM, no bony abnormality Psychiatric:Awake and alert.  Able to answer some questions and follow directions, but clearly has some cognitive slowing when attempting to answer questions and does not attempt to initiate any speech.  This is far different from her baseline, according to family. Neurologic: CN 2-12 grossly intact, moves all extremities in coordinated fashion, sensation intact.  However, she needs significant direction and assistance in cooperating with the exam.  Labs on Admission: I have personally reviewed following labs and imaging studies  CBC:  Recent Labs Lab 01/04/16 1600 01/04/16 1813  WBC 18.6*  --   NEUTROABS 15.3*  --   HGB 13.6 13.6  HCT 39.9 40.0  MCV 86.0  --   PLT 275  --    Basic Metabolic Panel:  Recent Labs Lab 01/04/16 1600 01/04/16 1813  NA 133* 136  K 3.0* 3.1*  CL 92* 93*  CO2 22  --   GLUCOSE 180* 186*  BUN 21* 21*  CREATININE 1.10* 0.80  CALCIUM 10.4*  --    GFR: Estimated  Creatinine Clearance: 43.6 mL/min (by C-G formula based on SCr of 0.8 mg/dL). Liver Function Tests:  Recent Labs Lab 01/04/16 1600  AST 32  ALT 16  ALKPHOS 37*  BILITOT 1.2  PROT 7.9  ALBUMIN 4.0    Recent Labs Lab 01/04/16 1600  LIPASE 27     Recent Labs Lab 01/04/16 1600  AMMONIA 26   Coagulation Profile:  Recent Labs Lab 01/04/16 1600  INR 1.02   Cardiac Enzymes:  Recent Labs Lab 01/04/16 1757  CKTOTAL 60   BNP (last 3 results) No results for input(s): PROBNP in the last 8760 hours. HbA1C: No results for input(s): HGBA1C in the last 72 hours. CBG:  Recent Labs Lab 01/04/16 2031  GLUCAP 182*   Lipid Profile: No results for input(s): CHOL, HDL, LDLCALC, TRIG, CHOLHDL, LDLDIRECT in the last 72 hours. Thyroid Function Tests:  Recent Labs  01/04/16 2005  TSH 0.765   Anemia Panel: No results for input(s): VITAMINB12, FOLATE, FERRITIN, TIBC, IRON, RETICCTPCT in the last 72 hours. Urine analysis:    Component Value Date/Time   COLORURINE YELLOW 01/04/2016 1740   APPEARANCEUR CLEAR 01/04/2016 1740   LABSPEC 1.020 01/04/2016 1740   PHURINE 6.5 01/04/2016 1740   GLUCOSEU NEGATIVE 01/04/2016 1740   HGBUR NEGATIVE 01/04/2016 1740   BILIRUBINUR NEGATIVE 01/04/2016 1740   KETONESUR 15 (A) 01/04/2016 1740   PROTEINUR >300 (A) 01/04/2016 1740   UROBILINOGEN 0.2 03/20/2013 1403   NITRITE NEGATIVE 01/04/2016 1740   LEUKOCYTESUR NEGATIVE 01/04/2016 1740    Creatinine Clearance: Estimated Creatinine Clearance: 43.6 mL/min (by C-G formula based on SCr of 0.8 mg/dL).  Sepsis Labs: @LABRCNTIP (procalcitonin:4,lacticidven:4) )No results found for this or any previous visit (from the past 240 hour(s)).   Radiological Exams on Admission: Ct Head Wo Contrast  Result Date: 01/04/2016 CLINICAL DATA:  Altered mental status. Generalized weakness over the last 1-2 weeks. EXAM: CT HEAD WITHOUT CONTRAST TECHNIQUE: Contiguous axial images were obtained from the base of the skull through the vertex without intravenous contrast. COMPARISON:  CT head without contrast 05/27/2015. FINDINGS: Brain: A tiny arachnoid cyst is again noted near the vertex on the right. No acute infarct, hemorrhage, or mass lesion is present.  Minimal atrophy and white matter disease is within normal limits for age. The basal ganglia and insular ribbon are normal. No focal cortical lesions are present. Vascular: Atherosclerotic calcifications are present within the cavernous internal carotid arteries and at the dural margin of the vertebral arteries bilaterally. Skull: The calvarium is intact. Sinuses/Orbits: A polyp or mucous retention cyst within the right sphenoid sinus is stable. The remaining paranasal sinuses and the mastoid air cells are clear. The patient is status post bilateral lens replacements. The globes and orbits are otherwise intact. Other: No significant extracranial soft tissue lesion is present. IMPRESSION: 1. No acute intracranial abnormality or significant interval change. 2. Atherosclerosis. Electronically Signed   By: San Morelle M.D.   On: 01/04/2016 16:57   Dg Chest Port 1 View  Result Date: 01/04/2016 CLINICAL DATA:  Generalized weakness for the last 2 weeks EXAM: PORTABLE CHEST 1 VIEW COMPARISON:  03/24/2013 FINDINGS: Cardiomediastinal silhouette is stable. No acute infiltrate or pleural effusion. No pulmonary edema. Again noted postsurgical changes with metallic fixation rods and transpedicular screws lumbar spine. IMPRESSION: No active disease. Electronically Signed   By: Lahoma Crocker M.D.   On: 01/04/2016 16:38    EKG: Independently reviewed.  Sinus tachycardia with rate 108; nonspecific ST changes with no evidence of acute ischemia -  has ST depression particularly noticeable in inferior leads, but this is longstanding and does not appear to be changed from prior  Assessment/Plan Principal Problem:   Catatonia ([place]) Active Problems:   Headache   H/O major depression   Hyperlipidemia   Hypertension   Constipation   Altered mental status   Catatonia -Acute onset of clear encephalopathy of uncertain etiology -Thorough initial evaluation to include CMP (mild hyponatremia/hypokalemia - will address  with IVF and replete; slightly increased creatinine above baseline 1.0 that should improve with IVF; mild  Hyperglycemia; anion gap which is likely to improve with IVF; slightly elevated BNP that is markedly better than in 2014 when it was last checked); leukocytosis of uncertain etiology;  UDS negative; normal TSH; slightly increased troponin (see below). -Would be a very unusual presentation of sepsis, but she does have leukocytosis and tachycardia with a lactate of 2.08.  Negative UA, negative CXR, no other apparent source for infection but will cover empirically for now.  Blood and urine cultures are pending.  Will initiate sepsis protocol, including an additional 1500 ml to reach 30 cc/kg bolus.  Will request STAT fluoro-guided LP - no nuchal rigidity, headache is her usual for 40 years - but with AMS and sepsis physiology without a source, this is important for completion of her work-up (discussed with radiologist and requested that his be performed tonight).  Will start Vanc and Zosyn empirically for now. -Would be an unusual presentation of ACS.  No c/o chest pain.  EKG does not look significantly different from prior.  Will trend cardiac enzymes and repeat EKG in AM.   -There was concern for CVA but negative CT and MRI so this seems very unlikely.  Will cancel MRA and stop plan for permissive HTN and instead work to control HTN. -Other considerations include medication reaction - will hold sedating medications other than giving morphine for severe pain; and psychiatric - has had paranoia and delusions in the past. -Consider neuro and/or psych consults in AM if not showing ongoing improvement.  However, from the time of presentation to the time I saw her, the patient had shown significant improvement and so will not request consultation at this time.  Headache with h/o paranoid/delusional thinking -Patient was last seen by neurology (Dr. Jannifer Franklin) in March 2017.  She was showing evidence of  paranoid/delusional thinking at that time and also had a score of 22/30 on the MMSE suggesting mild dementia.  It is possible that today's episode was acute but related to a chronic neurologic/psychiatric condition and may benefit from consultation, as noted above.  HTN -Initial plan was for permissive HTN but with negative MRI, likelihood of stroke is quite small -Patient remains unable to swallow at this time, but does have ongoing marked elevation in BP -For now, will continue her AM home meds (Norvasc, Lisinopril) in case she is improved in AM and able to swallow -Will hold her HCTZ (evening med) and her PO hydralazine and Inderal and substitute IV hydralazine and Labetolol prn  Constipation -Family reports long-standing constipation -Will continue PO Miralax in AM if able to swallow -Will also order dulcolax suppository prn constipation  DVT prophylaxis: Lovenox Code Status: DNR - confirmed with patient/family (initially unsure and so full code but family confirmed that she is DNR/DNI) Family Communication: Son and daughter-in-law present at bedside throughout evaluation Disposition Plan:  Likely SNF Consults called:  Radiology (for LP) Admission status: Admit to telemetry   Karmen Bongo MD [facility]  If 7PM-7AM,  please contact night-coverage [email or web address] Password [facility]  01/04/2016, 9:58 PM

## 2016-01-04 NOTE — ED Notes (Signed)
PA Dansie at bedside

## 2016-01-04 NOTE — ED Notes (Signed)
Patient now able to speak, pt requesting water, this RN made patient and family aware that she cannot eat or drink yet but patient provided with mouth swabs for comfort.

## 2016-01-04 NOTE — Progress Notes (Signed)
Pharmacy Antibiotic Note  Tamara Arnold is a 82 y.o. female admitted on 01/04/2016 with sepsis.  Pharmacy has been consulted for vancomycin and Zosyn dosing.  Plan: -Vancomycin 1250mg  IV x1 as loading dose, then vancomycin 1g IV every 24 hours.  Goal trough 15-20 mcg/mL. -Zosyn 3.375g IV q8h (4 hour infusion).  Height: 5\' 3"  (160 cm) Weight: 140 lb (63.5 kg) IBW/kg (Calculated) : 52.4  Temp (24hrs), Avg:97.7 F (36.5 C), Min:97.3 F (36.3 C), Max:98.2 F (36.8 C)   Recent Labs Lab 01/04/16 1600 01/04/16 1813  WBC 18.6*  --   CREATININE 1.10* 0.80  LATICACIDVEN  --  2.08*    Estimated Creatinine Clearance: 43.6 mL/min (by C-G formula based on SCr of 0.8 mg/dL).    Allergies  Allergen Reactions  . Gabapentin Other (See Comments)    headaches  . Prednisone Other (See Comments)    Headaches-all steroids  . Topiramate Other (See Comments)    Makes headaches worse  . Wellbutrin [Bupropion Hcl] Other (See Comments)    Caused headaches    Antimicrobials this admission: Vanc 8/15 >>  Zosyn 8/15 >>   Dose adjustments this admission: n/a  Microbiology results: 8/15 BCx: sent 8/15 UCx: sent    Thank you for allowing pharmacy to be a part of this patient's care.  Shamona Wirtz D. Malorie Bigford, PharmD, BCPS Clinical Pharmacist Pager: [phone] 01/04/2016 10:37 PM

## 2016-01-04 NOTE — ED Notes (Signed)
Dr Vanita Panda made aware patient is vomiting at this time

## 2016-01-04 NOTE — ED Notes (Signed)
Dr. Johnney Killian made aware that patient is now able to verbally communicate.

## 2016-01-04 NOTE — ED Notes (Signed)
Attempted to call report

## 2016-01-04 NOTE — ED Triage Notes (Signed)
Pt arrives via [facility] from home, ems reports pt LSN last night at 1800. Ems reports pts family stated pt is normally alert and oriented and was altered upon their arrival to check on her today. Ems also reports pt has had generalized weakness for the past 1-2 weeks. CBG 182, pt a fib on the monitor.

## 2016-01-04 NOTE — ED Notes (Signed)
Latic acid resulted at 2.08. MD notified.

## 2016-01-04 NOTE — ED Notes (Signed)
Pt transported to MRI with transporter. Transported stated they would take her to 2w15 when MRI completed. 2W nurse notified

## 2016-01-04 NOTE — ED Provider Notes (Signed)
[facility] DEPT Provider Note   CSN: [id number] Arrival date & time: 01/04/16  1532     History   Chief Complaint Chief Complaint  Patient presents with  . Altered Mental Status   Level V Caveat: Altered mental status  HPI Tamara Arnold is a 82 y.o. female.  Tamara Arnold is a 83 y.o. 82 y.o. Female who presents to the emergency department via EMS with altered mental status. Family reported to the bedside reports that she lives alone at home and is normally alert and oriented. Therefore she was last seen normal around 6 PM last night. The report when they went to check on her this morning she was not quite herself and is complaining of a headache. They report she has chronic bad migraine headaches and they suspected this was the same today. She then lay down for nap this afternoon and when she awoke she was not verbally responsive. She is also had vomiting today. Should one episode of vomiting after arrival to the emergency department that was dark and watery. EMS reports her blood sugar was 182. No recent changes to her medications. She does take tramadol for her migraines. She also has taken elavil for many years. No recent changes to her medications according to family.    The history is provided by a relative, the EMS personnel and medical records. The history is limited by the condition of the patient. No language interpreter was used.    Past Medical History:  Diagnosis Date  . Anemia   . Anxiety   . Arthritis    "neck, back; none in my knees" (03/20/2013)  . Chronic lower back pain   . Daily headache    "all the time for 37 yrs" (03/20/2013)  . Depression   . GERD (gastroesophageal reflux disease)   . History of blood transfusion 02/2013   "related to this recent back OR" (03/20/2013)  . Hyperlipidemia   . Hypertension   . Macular degeneration of both eyes   . Migraine    "not migraines/neurologist" (09/12/2012)  . Shortness of breath    "lately cause my  stomach is swollen" (03/20/2013)  . Skin cancer    "on my face alot; had one cut off left leg before; (03/20/2013)  . Type II diabetes mellitus ([place])    off all meds now    Patient Active Problem List   Diagnosis Date Noted  . Altered mental status 01/04/2016  . Catatonia ([place]) 01/04/2016  . Delusional disorder ([place]) 07/22/2015  . Vomiting 09/12/2012  . Dehydration 09/12/2012  . Hyponatremia 09/12/2012  . Constipation 01/23/2012  . Anemia 01/23/2012  . Hypertension   . Migraine   . Reflux   . Acute renal failure ([place]) 12/01/2011  . Hypokalemia 11/29/2011  . Nonspecific abnormal findings on radiological and examination of skull and head 11/29/2011  . Headache 11/28/2011  . Accelerated hypertension 11/28/2011  . Diabetes mellitus ([place]) 11/28/2011  . H/O major depression 11/28/2011  . Hyperlipidemia 11/28/2011    Past Surgical History:  Procedure Laterality Date  . ABDOMINAL HYSTERECTOMY    . APPENDECTOMY  1934  . BACK SURGERY  03/10/2013   [facility]-spinal stenosis  . CARPAL TUNNEL RELEASE Right 12/03/2013   Procedure: RIGHT CARPAL TUNNEL RELEASE;  Surgeon: Wynonia Sours, MD;  Location: [facility];  Service: Orthopedics;  Laterality: Right;  . CATARACT EXTRACTION W/ INTRAOCULAR LENS  IMPLANT, BILATERAL Bilateral   . CHOLECYSTECTOMY  1970's?  . CYSTECTOMY     "  top of my head; removed @ [facility]; it was benign" (09/12/2012)  . HERNIA REPAIR    . SKIN CANCER EXCISION     [facility]  . TRIGGER FINGER RELEASE Right 12/03/2013   Procedure: RELEASE TRIGGER FINGER/A-1 PULLEY SMALL FINGER;  Surgeon: Wynonia Sours, MD;  Location: [facility];  Service: Orthopedics;  Laterality: Right;  . UMBILICAL HERNIA REPAIR      OB History    No data available       Home Medications    Prior to Admission medications   Medication Sig Start Date End Date Taking? Authorizing Provider  acetaminophen (TYLENOL) 325 MG tablet Take 650 mg by mouth 3 (three) times daily as needed  for mild pain or headache.    Yes Historical Provider, MD  amitriptyline (ELAVIL) 25 MG tablet Take 50 mg by mouth at bedtime. 12/29/15  Yes Historical Provider, MD  amLODipine (NORVASC) 5 MG tablet Take 5 mg by mouth daily.    Yes Historical Provider, MD  ascorbic acid (VITAMIN C) 500 MG tablet Take 500 mg by mouth every evening.    Yes Historical Provider, MD  beta carotene w/minerals (OCUVITE) tablet Take 1 tablet by mouth daily.   Yes Historical Provider, MD  Calcium Carb-Cholecalciferol (CALCIUM 600+D) 600-800 MG-UNIT TABS Take 1 tablet by mouth every evening.   Yes Historical Provider, MD  clonazePAM (KLONOPIN) 1 MG tablet Take 1 tablet (1 mg total) by mouth 2 (two) times daily as needed for anxiety. For anxiety Patient taking differently: Take 1 mg by mouth at bedtime. For anxiety 03/27/13  Yes Kelvin Cellar, MD  fenofibrate 160 MG tablet Take 160 mg by mouth daily.   Yes Historical Provider, MD  ferrous sulfate 325 (65 FE) MG tablet Take 325 mg by mouth daily at 12 noon.   Yes Historical Provider, MD  fluticasone (FLONASE) 50 MCG/ACT nasal spray Place 2 sprays into the nose daily as needed for allergies (Seasonal).    Yes Historical Provider, MD  folic acid (FOLVITE) 1 MG tablet Take 1 mg by mouth 2 (two) times daily.     Yes Historical Provider, MD  hydrALAZINE (APRESOLINE) 50 MG tablet Take 50 mg by mouth 4 (four) times daily.    Yes Historical Provider, MD  hydrochlorothiazide (HYDRODIURIL) 25 MG tablet Take 25 mg by mouth every evening. 12/27/15  Yes Historical Provider, MD  lisinopril (PRINIVIL,ZESTRIL) 40 MG tablet Take 40 mg by mouth daily.   Yes Historical Provider, MD  Multiple Vitamins-Minerals (MULTIVITAMINS THER. W/MINERALS) TABS Take 1 tablet by mouth daily.     Yes Historical Provider, MD  omeprazole (PRILOSEC) 20 MG capsule Take 20 mg by mouth 2 (two) times daily before a meal.    Yes Historical Provider, MD  polyethylene glycol (MIRALAX / GLYCOLAX) packet Take 34 g by mouth  daily.    Yes Historical Provider, MD  propranolol (INDERAL) 60 MG tablet Take 120 mg by mouth at bedtime.    Yes Historical Provider, MD  traMADol (ULTRAM) 50 MG tablet Take 50 mg by mouth 3 (three) times daily as needed for moderate pain.  12/15/15  Yes Historical Provider, MD  vitamin E (VITAMIN E) 400 UNIT capsule Take 400 Units by mouth daily.     Yes Historical Provider, MD    Family History Family History  Problem Relation Age of Onset  . Diabetes type II Father     Social History Social History  Substance Use Topics  . Smoking status: Never Smoker  .  Smokeless tobacco: Never Used  . Alcohol use No     Allergies   Gabapentin; Prednisone; Topiramate; and Wellbutrin [bupropion hcl]   Review of Systems Review of Systems  Unable to perform ROS: Mental status change  Constitutional: Negative for fever.  Gastrointestinal: Positive for vomiting.     Physical Exam Updated Vital Signs BP (!) 188/65 (BP Location: Left Arm)   Pulse (!) 114   Temp 97.3 F (36.3 C) (Oral)   Resp (!) 22   Ht 5\' 3"  (1.6 m)   Wt 63.5 kg   SpO2 95%   BMI 24.80 kg/m   Physical Exam  Constitutional: She appears well-developed and well-nourished. No distress.  Non-toxic appearing.   HENT:  Head: Normocephalic and atraumatic.  Mouth/Throat: Oropharynx is clear and moist.  No visible signs of head trauma.  Eyes: Conjunctivae are normal. Pupils are equal, round, and reactive to light. Right eye exhibits no discharge. Left eye exhibits no discharge.  Neck: Neck supple. No JVD present.  Cardiovascular: Normal heart sounds and intact distal pulses.  Exam reveals no gallop and no friction rub.   No murmur heard. Irregular rhythm. Heart rate 118. Bilateral radial pulses are intact.  Pulmonary/Chest: Effort normal and breath sounds normal. No respiratory distress. She has no wheezes. She has no rales.  Abdominal: Soft. There is tenderness.  Mild tenderness noted to her left lower abdomen. Abdomen  is otherwise soft.  Musculoskeletal: She exhibits no edema.  Lymphadenopathy:    She has no cervical adenopathy.  Neurological: She is alert.  Patient is catatonic. She is alert and will often track with her eyes when spoken to. She has good strength to her upper and lower extremities and will hold her arms and legs up when placed there. She is not speaking and does nto follow commands. No seizure activity noted.   Skin: Skin is warm and dry. No rash noted. She is not diaphoretic. No erythema. No pallor.  Psychiatric:  Nonverbal.   Nursing note and vitals reviewed.    ED Treatments / Results  Labs (all labs ordered are listed, but only abnormal results are displayed) Labs Reviewed  COMPREHENSIVE METABOLIC PANEL - Abnormal; Notable for the following:       Result Value   Sodium 133 (*)    Potassium 3.0 (*)    Chloride 92 (*)    Glucose, Bld 180 (*)    BUN 21 (*)    Creatinine, Ser 1.10 (*)    Calcium 10.4 (*)    Alkaline Phosphatase 37 (*)    GFR calc non Af Amer 44 (*)    GFR calc Af Amer 50 (*)    Anion gap 19 (*)    All other components within normal limits  URINALYSIS, ROUTINE W REFLEX MICROSCOPIC (NOT AT [facility]) - Abnormal; Notable for the following:    Ketones, ur 15 (*)    Protein, ur >300 (*)    All other components within normal limits  CBC WITH DIFFERENTIAL/PLATELET - Abnormal; Notable for the following:    WBC 18.6 (*)    Neutro Abs 15.3 (*)    Monocytes Absolute 2.3 (*)    All other components within normal limits  BRAIN NATRIURETIC PEPTIDE - Abnormal; Notable for the following:    B Natriuretic Peptide 306.0 (*)    All other components within normal limits  URINE MICROSCOPIC-ADD ON - Abnormal; Notable for the following:    Squamous Epithelial / LPF 0-5 (*)    All other components  within normal limits  ACETAMINOPHEN LEVEL - Abnormal; Notable for the following:    Acetaminophen (Tylenol), Serum <10 (*)    All other components within normal limits  TROPONIN I -  Abnormal; Notable for the following:    Troponin I 0.15 (*)    All other components within normal limits  LACTIC ACID, PLASMA - Abnormal; Notable for the following:    Lactic Acid, Venous 2.0 (*)    All other components within normal limits  GLUCOSE, CAPILLARY - Abnormal; Notable for the following:    Glucose-Capillary 188 (*)    All other components within normal limits  CBG MONITORING, ED - Abnormal; Notable for the following:    Glucose-Capillary 182 (*)    All other components within normal limits  I-STAT TROPOININ, ED - Abnormal; Notable for the following:    Troponin i, poc 0.16 (*)    All other components within normal limits  I-STAT CG4 LACTIC ACID, ED - Abnormal; Notable for the following:    Lactic Acid, Venous 2.08 (*)    All other components within normal limits  I-STAT CHEM 8, ED - Abnormal; Notable for the following:    Potassium 3.1 (*)    Chloride 93 (*)    BUN 21 (*)    Glucose, Bld 186 (*)    All other components within normal limits  URINE CULTURE  CULTURE, BLOOD (ROUTINE X 2)  CULTURE, BLOOD (ROUTINE X 2)  CSF CULTURE  LIPASE, BLOOD  URINE RAPID DRUG SCREEN, HOSP PERFORMED  PROTIME-INR  AMMONIA  SALICYLATE LEVEL  CK  TSH  PROCALCITONIN  APTT  TROPONIN I  TROPONIN I  URINE RAPID DRUG SCREEN, HOSP PERFORMED  HEMOGLOBIN A1C  LIPID PANEL  LACTIC ACID, PLASMA  HSV(HERPES SMPLX VRS)ABS-I+II(IGG)-CSF  CSF CELL COUNT WITH DIFFERENTIAL  PROTEIN AND GLUCOSE, CSF  TYPE AND SCREEN    EKG  EKG Interpretation  Date/Time:  Tuesday January 04 2016 17:05:11 EDT Ventricular Rate:  108 PR Interval:    QRS Duration: 103 QT Interval:  311 QTC Calculation: 417 R Axis:   39 Text Interpretation:  Sinus tachycardia Atrial premature complex Probable left atrial enlargement LVH with secondary repolarization abnormality ST depr, consider ischemia, inferior leads no sig change Confirmed by Johnney Killian, MD, Jeannie Done [phone]) on 01/05/2016 12:12:43 AM       Radiology Ct  Head Wo Contrast  Result Date: 01/04/2016 CLINICAL DATA:  Altered mental status. Generalized weakness over the last 1-2 weeks. EXAM: CT HEAD WITHOUT CONTRAST TECHNIQUE: Contiguous axial images were obtained from the base of the skull through the vertex without intravenous contrast. COMPARISON:  CT head without contrast 05/27/2015. FINDINGS: Brain: A tiny arachnoid cyst is again noted near the vertex on the right. No acute infarct, hemorrhage, or mass lesion is present. Minimal atrophy and white matter disease is within normal limits for age. The basal ganglia and insular ribbon are normal. No focal cortical lesions are present. Vascular: Atherosclerotic calcifications are present within the cavernous internal carotid arteries and at the dural margin of the vertebral arteries bilaterally. Skull: The calvarium is intact. Sinuses/Orbits: A polyp or mucous retention cyst within the right sphenoid sinus is stable. The remaining paranasal sinuses and the mastoid air cells are clear. The patient is status post bilateral lens replacements. The globes and orbits are otherwise intact. Other: No significant extracranial soft tissue lesion is present. IMPRESSION: 1. No acute intracranial abnormality or significant interval change. 2. Atherosclerosis. Electronically Signed   By: Wynetta Fines.D.  On: 01/04/2016 16:57   Mr Brain W Or Wo Contrast  Result Date: 01/04/2016 CLINICAL DATA:  Last seen normal at 1800 hours yesterday. Headache, altered mental status. Generalized weakness for 1-2 weeks. History of hypertension, hyperlipidemia, migraines, diabetes. EXAM: MRI HEAD WITHOUT AND WITH CONTRAST TECHNIQUE: Multiplanar, multiecho pulse sequences of the brain and surrounding structures were obtained without and with intravenous contrast. CONTRAST:  69mL MULTIHANCE GADOBENATE DIMEGLUMINE 529 MG/ML IV SOLN COMPARISON:  CT HEAD January 04, 2016 at 1641 hours and MRI head November 29, 2011 and CT HEAD February 14, 2010  FINDINGS: INTRACRANIAL CONTENTS: No reduced diffusion to suggest acute ischemia. No susceptibility artifact to suggest hemorrhage. The ventricles and sulci are normal for patient's age. Minimal white matter changes compatible chronic small vessel ischemic disease, less than expected for age. No suspicious parenchymal signal, mass effect. Stable 11 x 13 x 13 mm homogeneously bright T2 RIGHT parietal cortical based lesion completely suppresses on FLAIR sequence, without enhancement or solid component. No abnormal intraparenchymal or extra-axial enhancement. No abnormal extra-axial fluid collections. Resolution of RIGHT parietal/parafalcine fluid collection with very minimal hemosiderin staining. No extra-axial masses. Normal major intracranial vascular flow voids present at skull base. ORBITS: The included ocular globes and orbital contents are non-suspicious. Status post bilateral ocular lens implants. SINUSES: Small LEFT sphenoid mucosal retention cyst. Trace RIGHT mastoid effusion. SKULL/SOFT TISSUES: No abnormal sellar expansion. No suspicious calvarial bone marrow signal. Craniocervical junction maintained. IMPRESSION: No acute intracranial process. Slightly larger 11 x 11 x 13 cyst RIGHT parietal benign-appearing cyst, possibly neural glial though, recommend close attention on follow-up imaging. Resolution of RIGHT parietal extra-axial fluid collection, which was most compatible with blood products. Electronically Signed   By: Elon Alas M.D.   On: 01/04/2016 22:14   Dg Chest Port 1 View  Result Date: 01/04/2016 CLINICAL DATA:  Generalized weakness for the last 2 weeks EXAM: PORTABLE CHEST 1 VIEW COMPARISON:  03/24/2013 FINDINGS: Cardiomediastinal silhouette is stable. No acute infiltrate or pleural effusion. No pulmonary edema. Again noted postsurgical changes with metallic fixation rods and transpedicular screws lumbar spine. IMPRESSION: No active disease. Electronically Signed   By: Lahoma Crocker M.D.    On: 01/04/2016 16:38    Procedures Procedures (including critical care time)  Medications Ordered in ED Medications  ondansetron (ZOFRAN) 4 MG/2ML injection (  Not Given 01/04/16 2015)  polyethylene glycol (MIRALAX / GLYCOLAX) packet 34 g (not administered)  enoxaparin (LOVENOX) injection 40 mg (40 mg Subcutaneous Given 01/04/16 2317)  0.9 %  sodium chloride infusion ( Intravenous New Bag/Given 01/04/16 2241)  acetaminophen (TYLENOL) tablet 650 mg (not administered)    Or  acetaminophen (TYLENOL) suppository 650 mg (not administered)  aspirin suppository 300 mg (not administered)    Or  aspirin tablet 325 mg (not administered)  ondansetron (ZOFRAN) injection 4 mg (not administered)  morphine 2 MG/ML injection 2 mg (2 mg Intravenous Given 01/04/16 2249)  multivitamin-lutein (OCUVITE-LUTEIN) capsule 1 capsule (not administered)  potassium chloride 10 mEq in 100 mL IVPB (10 mEq Intravenous Given 01/05/16 0010)  amLODipine (NORVASC) tablet 5 mg (not administered)  lisinopril (PRINIVIL,ZESTRIL) tablet 40 mg (not administered)  vancomycin (VANCOCIN) 1,250 mg in sodium chloride 0.9 % 250 mL IVPB (not administered)  vancomycin (VANCOCIN) IVPB 1000 mg/200 mL premix (not administered)  piperacillin-tazobactam (ZOSYN) IVPB 3.375 g (not administered)  piperacillin-tazobactam (ZOSYN) IVPB 3.375 g (not administered)  labetalol (NORMODYNE,TRANDATE) injection 10 mg (10 mg Intravenous Given 01/04/16 2311)  hydrALAZINE (APRESOLINE) injection 10 mg (not administered)  bisacodyl (  DULCOLAX) suppository 10 mg (not administered)  ondansetron (ZOFRAN) injection 4 mg (4 mg Intravenous Given 01/04/16 1619)  sodium chloride 0.9 % bolus 500 mL (0 mLs Intravenous Stopped 01/04/16 1850)  sodium chloride 0.9 % bolus 500 mL (500 mLs Intravenous New Bag/Given 01/04/16 1902)  morphine 2 MG/ML injection 2 mg (2 mg Intravenous Given 01/04/16 1902)  ondansetron (ZOFRAN) injection 4 mg (4 mg Intravenous Given 01/04/16 2000)    gadobenate dimeglumine (MULTIHANCE) injection 15 mL (14 mLs Intravenous Contrast Given 01/04/16 2152)  sodium chloride 0.9 % bolus 1,500 mL (1,500 mLs Intravenous Given 01/04/16 2253)     Initial Impression / Assessment and Plan / ED Course  I have reviewed the triage vital signs and the nursing notes.  Pertinent labs & imaging results that were available during my care of the patient were reviewed by me and considered in my medical decision making (see chart for details).  Clinical Course   Patient presented to the emergency department and catatonic state. Patient is nonresponsive on exam and will hold her arms up in the air when they are placed there. According to family she is usually alert and oriented at baseline. They report she has been complaining of headaches but patient has chronic migraine headaches that are debilitating. On exam the patient is afebrile nontoxic appearing. She is mildly tachycardic in a sinus tachycardia. No atrial fibrillation. Head CT showed no acute findings. Chest x-ray showed no acute findings. Urine showed no evidence of infection. White count is elevated 18,000. Patient has no fever and no evidence of infection on exam. Ammonia is within normal limits. CMP revealed potassium of 3.0. Chloride is 92. She is an anion gap of 19. Patient's urinalysis showed no signs of infection. She was retaining urine and was found to have 1400 mL's and her bladder with Foley catheter. Troponin was also elevated. No STEMI on EKG. I discussed this with my attending and she feels the troponins will need to be trended. Patient is chronically on tramadol and amitriptyline. We have concern for possible medication toxidrome from her tramadol or amitriptyline. At reevaluation patient is now verbal. She is obeying commands. She has no focal neurological deficits on exam. She tells me she hurts in her head, and her abdomen, and her legs and in her chest. She tells me she hurts all over her body.  She is very pleasant on exam. Plan is for admission. Family at bedside agrees. I consulted with hospitalist Dr. Lorin Mercy who accepted the patient for admission. She requested that I order a TSH and an MR brain.   This patient was discussed with and evaluated by Dr. Johnney Killian who agrees with assessment and plan.    Final Clinical Impressions(s) / ED Diagnoses   Final diagnoses:  Catatonia [facility])    New Prescriptions Current Discharge Medication List       Waynetta Pean, PA-C 01/05/16 0107    Charlesetta Shanks, MD 02/08/16 [phone]

## 2016-01-04 NOTE — ED Notes (Signed)
Troponin resulted at 0.16. MD notified.

## 2016-01-04 NOTE — ED Notes (Signed)
PA Dansie made aware patient is requesting something for a headache

## 2016-01-05 ENCOUNTER — Inpatient Hospital Stay (HOSPITAL_COMMUNITY): Payer: PPO

## 2016-01-05 ENCOUNTER — Encounter (HOSPITAL_COMMUNITY): Payer: Self-pay | Admitting: General Practice

## 2016-01-05 DIAGNOSIS — I10 Essential (primary) hypertension: Secondary | ICD-10-CM | POA: Diagnosis not present

## 2016-01-05 DIAGNOSIS — Z8659 Personal history of other mental and behavioral disorders: Secondary | ICD-10-CM

## 2016-01-05 DIAGNOSIS — E785 Hyperlipidemia, unspecified: Secondary | ICD-10-CM

## 2016-01-05 DIAGNOSIS — F061 Catatonic disorder due to known physiological condition: Secondary | ICD-10-CM | POA: Diagnosis not present

## 2016-01-05 DIAGNOSIS — K5901 Slow transit constipation: Secondary | ICD-10-CM | POA: Diagnosis not present

## 2016-01-05 DIAGNOSIS — R51 Headache: Secondary | ICD-10-CM | POA: Diagnosis not present

## 2016-01-05 HISTORY — PX: LUMBAR PUNCTURE: SHX1985

## 2016-01-05 LAB — TROPONIN I
TROPONIN I: 0.19 ng/mL — AB (ref <0.03)
TROPONIN I: 0.22 ng/mL — AB (ref <0.03)
TROPONIN I: 0.24 ng/mL — AB (ref <0.03)
Troponin I: 0.16 ng/mL (ref <0.03)
Troponin I: 0.18 ng/mL (ref <0.03)

## 2016-01-05 LAB — LIPID PANEL
CHOLESTEROL: 133 mg/dL (ref 0–200)
HDL: 82 mg/dL (ref >40)
LDL CALC: 40 mg/dL (ref 0–99)
Total CHOL/HDL Ratio: 1.6 RATIO
Triglycerides: 55 mg/dL (ref <150)
VLDL: 11 mg/dL (ref 0–40)

## 2016-01-05 LAB — CSF CELL COUNT WITH DIFFERENTIAL
RBC COUNT CSF: 77 /mm3 — AB
TUBE #: 3
WBC, CSF: 1 /mm3 (ref 0–5)

## 2016-01-05 LAB — COMPREHENSIVE METABOLIC PANEL
ALK PHOS: 32 U/L — AB (ref 38–126)
ALT: 14 U/L (ref 14–54)
AST: 24 U/L (ref 15–41)
Albumin: 3.2 g/dL — ABNORMAL LOW (ref 3.5–5.0)
Anion gap: 10 (ref 5–15)
BUN: 17 mg/dL (ref 6–20)
CALCIUM: 9.2 mg/dL (ref 8.9–10.3)
CHLORIDE: 101 mmol/L (ref 101–111)
CO2: 25 mmol/L (ref 22–32)
CREATININE: 0.84 mg/dL (ref 0.44–1.00)
GFR calc Af Amer: >60 mL/min (ref >60)
Glucose, Bld: 219 mg/dL — ABNORMAL HIGH (ref 65–99)
Potassium: 3.4 mmol/L — ABNORMAL LOW (ref 3.5–5.1)
SODIUM: 136 mmol/L (ref 135–145)
Total Bilirubin: 0.6 mg/dL (ref 0.3–1.2)
Total Protein: 6.4 g/dL — ABNORMAL LOW (ref 6.5–8.1)

## 2016-01-05 LAB — RAPID URINE DRUG SCREEN, HOSP PERFORMED
AMPHETAMINES: NOT DETECTED
BENZODIAZEPINES: NOT DETECTED
Barbiturates: NOT DETECTED
Cocaine: NOT DETECTED
Opiates: POSITIVE — AB
TETRAHYDROCANNABINOL: NOT DETECTED

## 2016-01-05 LAB — CBC
HCT: 33.9 % — ABNORMAL LOW (ref 36.0–46.0)
Hemoglobin: 11.4 g/dL — ABNORMAL LOW (ref 12.0–15.0)
MCH: 29.1 pg (ref 26.0–34.0)
MCHC: 33.6 g/dL (ref 30.0–36.0)
MCV: 86.5 fL (ref 78.0–100.0)
PLATELETS: 253 10*3/uL (ref 150–400)
RBC: 3.92 MIL/uL (ref 3.87–5.11)
RDW: 16.1 % — AB (ref 11.5–15.5)
WBC: 23 10*3/uL — ABNORMAL HIGH (ref 4.0–10.5)

## 2016-01-05 LAB — URINE CULTURE: CULTURE: NO GROWTH

## 2016-01-05 LAB — LACTIC ACID, PLASMA: Lactic Acid, Venous: 2.2 mmol/L (ref 0.5–1.9)

## 2016-01-05 LAB — PROTEIN AND GLUCOSE, CSF
GLUCOSE CSF: 120 mg/dL — AB (ref 40–70)
TOTAL PROTEIN, CSF: 105 mg/dL — AB (ref 15–45)

## 2016-01-05 LAB — AMMONIA: Ammonia: 22 umol/L (ref 9–35)

## 2016-01-05 LAB — VITAMIN B12: Vitamin B-12: 1295 pg/mL — ABNORMAL HIGH (ref 180–914)

## 2016-01-05 LAB — FOLATE: FOLATE: 46.4 ng/mL (ref >5.9)

## 2016-01-05 MED ORDER — WHITE PETROLATUM GEL
Status: AC
  Administered 2016-01-06
  Filled 2016-01-05: qty 1

## 2016-01-05 MED ORDER — LORAZEPAM 2 MG/ML IJ SOLN: 0.5000 mg | Freq: Four times a day (QID) | INTRAMUSCULAR | Status: DC | PRN

## 2016-01-05 MED ORDER — PROSIGHT PO TABS
1.0000 | ORAL_TABLET | Freq: Every day | ORAL | Status: DC
  Administered 2016-01-05 – 2016-01-11 (×4): 1 via ORAL
  Filled 2016-01-05 (×7): qty 1

## 2016-01-05 MED ORDER — METOPROLOL TARTRATE 5 MG/5ML IV SOLN
2.5000 mg | Freq: Four times a day (QID) | INTRAVENOUS | Status: DC
  Administered 2016-01-05 – 2016-01-08 (×11): 2.5 mg via INTRAVENOUS
  Filled 2016-01-05 (×9): qty 5

## 2016-01-05 NOTE — Progress Notes (Signed)
EEG Completed; Results Pending  

## 2016-01-05 NOTE — Procedures (Signed)
History: 82 year old female with altered mental status  Sedation: None  Technique: This is a 21 channel routine scalp EEG performed at the bedside with bipolar and monopolar montages arranged in accordance to the international 10/20 system of electrode placement. One channel was dedicated to EKG recording.    Background: The background consists of intermixed alpha and beta activities. There is a well defined posterior dominant rhythm of 9 Hz that attenuates with eye opening. There is very mild irregular delta even during wakefulness. Sleep is not recorded.  Photic stimulation: Physiologic driving is not performed  EEG Abnormalities: 1) mild generalized irregular slow activity  Clinical Interpretation: This borderline EEG is consistent with a possible mild generalized cerebral dysfunction(encephalopathy). There was no seizure or seizure predisposition recorded on this study. Please note that a normal EEG does not preclude the possibility of epilepsy.   Roland Rack, MD [facility] [phone]  If 7pm- 7am, please page neurology on call as listed in [place].

## 2016-01-05 NOTE — Progress Notes (Addendum)
Per CCMD pt had run of 6-7 beat SVT. MD pg'd. Also pg'd MD that troponins have continued to trend up.   Pt also awake stating she can not breath and is nauseated. 02 95% but [place] applied for comfort and zofran given with relief. Pt able to settle down and sleep. BP also elevated > 180. Hydralazine prn given with reduction of BP <180. See flowsheet for details. Will continue to monitor.

## 2016-01-05 NOTE — Evaluation (Signed)
Speech Language Pathology Evaluation Patient Details Name: LEE NWANKWO MRN: [id number] DOB: Jun 25, 1925 Today's Date: 01/05/2016 Time: [id number] SLP Time Calculation (min) (ACUTE ONLY): 15 min  Problem List:  Patient Active Problem List   Diagnosis Date Noted  . Altered mental status 01/04/2016  . Catatonia ([place]) 01/04/2016  . Delusional disorder ([place]) 07/22/2015  . Vomiting 09/12/2012  . Dehydration 09/12/2012  . Hyponatremia 09/12/2012  . Constipation 01/23/2012  . Anemia 01/23/2012  . Hypertension   . Migraine   . Reflux   . Acute renal failure ([place]) 12/01/2011  . Hypokalemia 11/29/2011  . Nonspecific abnormal findings on radiological and examination of skull and head 11/29/2011  . Headache 11/28/2011  . Accelerated hypertension 11/28/2011  . Diabetes mellitus ([place]) 11/28/2011  . H/O major depression 11/28/2011  . Hyperlipidemia 11/28/2011   Past Medical History:  Past Medical History:  Diagnosis Date  . Anemia   . Anxiety   . Arthritis    "neck, back; none in my knees" (03/20/2013)  . Chronic lower back pain   . Daily headache    "all the time for 37 yrs" (03/20/2013)  . Depression   . GERD (gastroesophageal reflux disease)   . History of blood transfusion 02/2013   "related to this recent back OR" (03/20/2013)  . Hyperlipidemia   . Hypertension   . Macular degeneration of both eyes   . Migraine    "not migraines/neurologist" (09/12/2012)  . Shortness of breath    "lately cause my stomach is swollen" (03/20/2013)  . Skin cancer    "on my face alot; had one cut off left leg before; (03/20/2013)  . Type II diabetes mellitus ([place])    off all meds now   Past Surgical History:  Past Surgical History:  Procedure Laterality Date  . ABDOMINAL HYSTERECTOMY    . APPENDECTOMY  1934  . BACK SURGERY  03/10/2013   [facility]  . CARPAL TUNNEL RELEASE Right 12/03/2013   Procedure: RIGHT CARPAL TUNNEL RELEASE;  Surgeon: Wynonia Sours, MD;  Location:  [facility];  Service: Orthopedics;  Laterality: Right;  . CATARACT EXTRACTION W/ INTRAOCULAR LENS  IMPLANT, BILATERAL Bilateral   . CHOLECYSTECTOMY  1970's?  . CYSTECTOMY     "top of my head; removed @ [facility]; it was benign" (09/12/2012)  . HERNIA REPAIR    . SKIN CANCER EXCISION     LLE  . TRIGGER FINGER RELEASE Right 12/03/2013   Procedure: RELEASE TRIGGER FINGER/A-1 PULLEY SMALL FINGER;  Surgeon: Wynonia Sours, MD;  Location: [facility];  Service: Orthopedics;  Laterality: Right;  . UMBILICAL HERNIA REPAIR     HPI:  82 y.o.femalewith medical history significant of chronic severe headaches and some paranoia/psychiatric illness in her later years but overall clear mental status despite her age presenting in a catatonic state. MRI negative for acute event.  Dx HTN; followed by Dr. Eugenie Birks (March 2017) with dx mild dementia, paranoid/delusional thinking.    Assessment / Plan / Recommendation Clinical Impression  Pt with improved cognition/speech today. Engages and smiles at examiner.  Oriented x3; adequate attention.  Naming, following commands WFL.  Language intact. Speaks clearly with no dysarthia and normal sentence length.  Mild baseline dementia with short-term memory deficits evident.  Appears to be approaching baseline.  MRI negative.  No communication deficits identified - our services will sign off.     SLP Assessment  Patient does not need any further Speech Lanaguage Pathology Services    Follow Up  Recommendations  None    Frequency and Duration           SLP Evaluation Prior Functioning  Cognitive/Linguistic Baseline: Baseline deficits Baseline deficit details: mild dementia per notes  Lives With: Alone   Cognition  Overall Cognitive Status: History of cognitive impairments - at baseline Arousal/Alertness: Awake/alert Orientation Level: Oriented to person;Oriented to place;Oriented to situation Attention: Focused;Sustained Focused  Attention: Appears intact Sustained Attention: Appears intact Memory: Impaired Memory Impairment: Retrieval deficit (at baseline) Awareness: Appears intact    Comprehension  Auditory Comprehension Overall Auditory Comprehension: Appears within functional limits for tasks assessed Visual Recognition/Discrimination Discrimination: Within Function Limits Reading Comprehension Reading Status: Not tested    Expression Expression Primary Mode of Expression: Verbal Verbal Expression Overall Verbal Expression: Appears within functional limits for tasks assessed   Oral / Motor  Oral Motor/Sensory Function Overall Oral Motor/Sensory Function: Within functional limits Motor Speech Overall Motor Speech: Appears within functional limits for tasks assessed   GO                   Evelynne Spiers L. Tivis Ringer, [place] CCC/SLP Pager [phone]  [name] 01/05/2016, 10:50 AM

## 2016-01-05 NOTE — Consult Note (Signed)
NEURO HOSPITALIST CONSULT NOTE   Requestig physician: Dr. Tana Coast   Reason for Consult: decreased responsivness   History obtained from:  Patient   Chart    HPI:                                                                                                                                          Tamara Arnold is an 82 y.o. female "with medical history significant of chronic severe headaches and some paranoia/psychiatric illness in her later years but overall clear mental status despite her age presenting in a catatonic state.  Family is present with her and is able to provide the history, as the patient is still unable to fully communicate (but improved).  She has a family caregiver a few mornings a week.  The caregiver went over today and the patient was sitting in a chair and appeared unable to talk at all.  Patient "came out of it" but was pointing to her head (40 years of severe headaches).  The caregiver tried to get her to eat and take Tramadol - none since previous night.  Has been taking Tramadol for several months with no prior problem.  She was able to eat oatmeal and was acting better other than the pain.  She did vomit this AM prior to eating, about 0900-1000.  She took a nap in her chair.  The caregiver called the patient's daughter-in-law at about 1330; she appeared to be awake but was not responding.  DIL came over and the patient would not respond at all.  No talking, glazed look staring off in the distance.  She did try to shake her head "no" a couple of times but otherwise completely unresponsive.   Not moving at all, did not seem to favor one side or the other.  No known emesis since other than 1 episode in the ER and currently appears to be nauseated.     Family reports that she did have a really bad day on Saturday.  Was telling son she thought she was going to die.  Stomach, head hurting, legs felt like lead - all normal for her in these situations.  Took  Tramadol with some relief and went to bed that night.    Family has noticed some overall functional decline recently and are beginning to have some concerns about her ability to remain living in her home alone.  Occasionally will go to take medication and it will fall out of her mouth without her noticing.  Her memory, however, has been excellent - she still knows the names and DOB of her 60+ great-grandchildren!"  While hospitalized patient has undergone a lumbar puncture showing glucose 120, protein 105, red blood cells 77, white blood cells 1 with clear colorless CSF.  HSV pending. UA negative for UTI and chest x-ray and negative for pneumonia.  Patient does have an elevated WBC of 23 with a lactic acid of 2.2 currently on Zosyn and vancomycin.  MRI brain was obtained on 01/04/2016 showing no acute intracranial process. It does show a slightly larger 11 x 11 x 13 mm cyst on the right parietal area.   She has remained afebrile during hospitalization.  Neurology was consultation for decreased responsiveness.   Upon entering the room patient is awake and alert and expresses to me that she feels nauseous and her stomach. She has a green emesis bag sitting on her. She is able to tell me that she is at [facility]. When asked about the month or year she quickly responds with I just don't know. She is able to follow commands without any difficulty and expressed to me that she just does not feel well and has not felt well for the last few weeks and her stomach.  Past Medical History:  Diagnosis Date  . Anemia   . Anxiety   . Arthritis    "neck, back; none in my knees" (03/20/2013)  . Chronic lower back pain   . Daily headache    "all the time for 37 yrs" (03/20/2013)  . Depression   . GERD (gastroesophageal reflux disease)   . History of blood transfusion 02/2013   "related to this recent back OR" (03/20/2013)  . Hyperlipidemia   . Hypertension   . Macular degeneration of both eyes   .  Migraine    "not migraines/neurologist" (09/12/2012)  . Shortness of breath    "lately cause my stomach is swollen" (03/20/2013)  . Skin cancer    "on my face alot; had one cut off left leg before; (03/20/2013)  . Type II diabetes mellitus ([place])    off all meds now    Past Surgical History:  Procedure Laterality Date  . ABDOMINAL HYSTERECTOMY    . APPENDECTOMY  1934  . BACK SURGERY  03/10/2013   [facility]  . CARPAL TUNNEL RELEASE Right 12/03/2013   Procedure: RIGHT CARPAL TUNNEL RELEASE;  Surgeon: Wynonia Sours, MD;  Location: [facility];  Service: Orthopedics;  Laterality: Right;  . CATARACT EXTRACTION W/ INTRAOCULAR LENS  IMPLANT, BILATERAL Bilateral   . CHOLECYSTECTOMY  1970's?  . CYSTECTOMY     "top of my head; removed @ [facility]; it was benign" (09/12/2012)  . HERNIA REPAIR    . SKIN CANCER EXCISION     LLE  . TRIGGER FINGER RELEASE Right 12/03/2013   Procedure: RELEASE TRIGGER FINGER/A-1 PULLEY SMALL FINGER;  Surgeon: Wynonia Sours, MD;  Location: [facility];  Service: Orthopedics;  Laterality: Right;  . UMBILICAL HERNIA REPAIR      Family History  Problem Relation Age of Onset  . Diabetes type II Father      Social History:  reports that she has never smoked. She has never used smokeless tobacco. She reports that she does not drink alcohol or use drugs.  Allergies  Allergen Reactions  . Gabapentin Other (See Comments)    headaches  . Prednisone Other (See Comments)    Headaches-all steroids  . Topiramate Other (See Comments)    Makes headaches worse  . Wellbutrin [Bupropion Hcl] Other (See Comments)    Caused headaches    MEDICATIONS:  Scheduled: . amLODipine  5 mg Oral Daily  . aspirin  300 mg Rectal Daily   Or  . aspirin  325 mg Oral Daily  . enoxaparin (LOVENOX) injection  40 mg Subcutaneous QHS  .  lisinopril  40 mg Oral Daily  . metoprolol  2.5 mg Intravenous Q6H  . multivitamin  1 tablet Oral Daily  . piperacillin-tazobactam (ZOSYN)  IV  3.375 g Intravenous Q8H  . polyethylene glycol  34 g Oral Daily  . vancomycin  1,000 mg Intravenous Q24H     ROS:                                                                                                                                       History obtained from the patient  General ROS: negative for - chills, fatigue, fever, night sweats, weight gain or weight loss Psychological ROS: negative for - behavioral disorder, hallucinations, memory difficulties, mood swings or suicidal ideation Ophthalmic ROS: negative for - blurry vision, double vision, eye pain or loss of vision ENT ROS: negative for - epistaxis, nasal discharge, oral lesions, sore throat, tinnitus or vertigo Allergy and Immunology ROS: negative for - hives or itchy/watery eyes Hematological and Lymphatic ROS: negative for - bleeding problems, bruising or swollen lymph nodes Endocrine ROS: negative for - galactorrhea, hair pattern changes, polydipsia/polyuria or temperature intolerance Respiratory ROS: negative for - cough, hemoptysis, shortness of breath or wheezing Cardiovascular ROS: negative for - chest pain, dyspnea on exertion, edema or irregular heartbeat Gastrointestinal ROS: negative for - abdominal pain, diarrhea, hematemesis, nausea/vomiting or stool incontinence Genito-Urinary ROS: negative for - dysuria, hematuria, incontinence or urinary frequency/urgency Musculoskeletal ROS: negative for - joint swelling or muscular weakness Neurological ROS: as noted in HPI Dermatological ROS: negative for rash and skin lesion changes   Blood pressure (!) 174/66, pulse (!) 102, temperature 97.8 F (36.6 C), temperature source Oral, resp. rate (!) 22, height 5\' 3"  (1.6 m), weight 63.5 kg (140 lb), SpO2 95 %.   Neurologic Examination:                                                                                                       HEENT-  Normocephalic, no lesions, without obvious abnormality.  Normal external eye and conjunctiva.  Normal TM's bilaterally.  Normal auditory canals and external ears. Normal external nose, mucus membranes and septum.  Normal pharynx. Cardiovascular- S1, S2 normal, pulses palpable throughout   Lungs- chest clear, no wheezing, rales, normal symmetric  air entry Abdomen- normal findings: bowel sounds normal Extremities- no edema Lymph-no adenopathy palpable Musculoskeletal-no joint tenderness, deformity or swelling Skin-warm and dry, no hyperpigmentation, vitiligo, or suspicious lesions  Neurological Examination Mental Status: Alert, oriented to hospital but not month or year. When speaking with her her focuses on how she feels nauseous and not well and has not felt well for the last few weeks.  Speech fluent without evidence of aphasia.  Able to follow 1-2 step commands without difficulty. Cranial Nerves: II:  Visual fields grossly normal, pupils equal, round, reactive to light and accommodation III,IV, VI: ptosis not present, extra-ocular motions intact bilaterally V,VII: smile symmetric, facial light touch sensation normal bilaterally VIII: hearing normal bilaterally IX,X: uvula rises symmetrically XI: bilateral shoulder shrug XII: midline tongue extension Motor: Right : Upper extremity   5/5    Left:     Upper extremity   5/5  Lower extremity   5/5     Lower extremity   5/5 Tone and bulk:normal tone throughout; no atrophy noted Sensory: Pinprick and light touch intact throughout, bilaterally Deep Tendon Reflexes: 2+ and symmetric throughout upper extremities with 1+ knee jerk bilaterally no ankle jerks Plantars: Right: downgoing   Left: downgoing Cerebellar: normal finger-to-nose, and no dysmetria noted with heel to shin however this was limited. Gait: Not tested      Lab Results: Basic Metabolic Panel:  Recent  Labs Lab 01/04/16 1600 01/04/16 1813 01/05/16 0916  NA 133* 136 136  K 3.0* 3.1* 3.4*  CL 92* 93* 101  CO2 22  --  25  GLUCOSE 180* 186* 219*  BUN 21* 21* 17  CREATININE 1.10* 0.80 0.84  CALCIUM 10.4*  --  9.2    Liver Function Tests:  Recent Labs Lab 01/04/16 1600 01/05/16 0916  AST 32 24  ALT 16 14  ALKPHOS 37* 32*  BILITOT 1.2 0.6  PROT 7.9 6.4*  ALBUMIN 4.0 3.2*    Recent Labs Lab 01/04/16 1600  LIPASE 27    Recent Labs Lab 01/04/16 1600 01/05/16 0916  AMMONIA 26 22    CBC:  Recent Labs Lab 01/04/16 1600 01/04/16 1813 01/05/16 0916  WBC 18.6*  --  23.0*  NEUTROABS 15.3*  --   --   HGB 13.6 13.6 11.4*  HCT 39.9 40.0 33.9*  MCV 86.0  --  86.5  PLT 275  --  253    Cardiac Enzymes:  Recent Labs Lab 01/04/16 1757 01/04/16 2249 01/05/16 0309 01/05/16 0916  CKTOTAL 60  --   --   --   TROPONINI  --  0.15* 0.16* 0.19*  0.18*    Lipid Panel:  Recent Labs Lab 01/05/16 0309  CHOL 133  TRIG 55  HDL 82  CHOLHDL 1.6  VLDL 11  LDLCALC 40    CBG:  Recent Labs Lab 01/04/16 2031 01/04/16 2233  GLUCAP 182* 188*    Microbiology: Results for orders placed or performed during the hospital encounter of 01/04/16  CSF culture with Stat gram stain     Status: None (Preliminary result)   Collection Time: 01/05/16  1:00 AM  Result Value Ref Range Status   Specimen Description CSF  Final   Special Requests Normal  Final   Gram Stain   Final    CYTOSPIN SMEAR WBC PRESENT,BOTH PMN AND MONONUCLEAR NO ORGANISMS SEEN    Culture PENDING  Incomplete   Report Status PENDING  Incomplete    Coagulation Studies:  Recent Labs  01/04/16 1600  LABPROT 13.4  INR 1.02    Imaging: Ct Head Wo Contrast  Result Date: 01/04/2016 CLINICAL DATA:  Altered mental status. Generalized weakness over the last 1-2 weeks. EXAM: CT HEAD WITHOUT CONTRAST TECHNIQUE: Contiguous axial images were obtained from the base of the skull through the vertex without  intravenous contrast. COMPARISON:  CT head without contrast 05/27/2015. FINDINGS: Brain: A tiny arachnoid cyst is again noted near the vertex on the right. No acute infarct, hemorrhage, or mass lesion is present. Minimal atrophy and white matter disease is within normal limits for age. The basal ganglia and insular ribbon are normal. No focal cortical lesions are present. Vascular: Atherosclerotic calcifications are present within the cavernous internal carotid arteries and at the dural margin of the vertebral arteries bilaterally. Skull: The calvarium is intact. Sinuses/Orbits: A polyp or mucous retention cyst within the right sphenoid sinus is stable. The remaining paranasal sinuses and the mastoid air cells are clear. The patient is status post bilateral lens replacements. The globes and orbits are otherwise intact. Other: No significant extracranial soft tissue lesion is present. IMPRESSION: 1. No acute intracranial abnormality or significant interval change. 2. Atherosclerosis. Electronically Signed   By: San Morelle M.D.   On: 01/04/2016 16:57   Mr Brain W Or Wo Contrast  Result Date: 01/04/2016 CLINICAL DATA:  Last seen normal at 1800 hours yesterday. Headache, altered mental status. Generalized weakness for 1-2 weeks. History of hypertension, hyperlipidemia, migraines, diabetes. EXAM: MRI HEAD WITHOUT AND WITH CONTRAST TECHNIQUE: Multiplanar, multiecho pulse sequences of the brain and surrounding structures were obtained without and with intravenous contrast. CONTRAST:  71mL MULTIHANCE GADOBENATE DIMEGLUMINE 529 MG/ML IV SOLN COMPARISON:  CT HEAD January 04, 2016 at 1641 hours and MRI head November 29, 2011 and CT HEAD February 14, 2010 FINDINGS: INTRACRANIAL CONTENTS: No reduced diffusion to suggest acute ischemia. No susceptibility artifact to suggest hemorrhage. The ventricles and sulci are normal for patient's age. Minimal white matter changes compatible chronic small vessel ischemic disease,  less than expected for age. No suspicious parenchymal signal, mass effect. Stable 11 x 13 x 13 mm homogeneously bright T2 RIGHT parietal cortical based lesion completely suppresses on FLAIR sequence, without enhancement or solid component. No abnormal intraparenchymal or extra-axial enhancement. No abnormal extra-axial fluid collections. Resolution of RIGHT parietal/parafalcine fluid collection with very minimal hemosiderin staining. No extra-axial masses. Normal major intracranial vascular flow voids present at skull base. ORBITS: The included ocular globes and orbital contents are non-suspicious. Status post bilateral ocular lens implants. SINUSES: Small LEFT sphenoid mucosal retention cyst. Trace RIGHT mastoid effusion. SKULL/SOFT TISSUES: No abnormal sellar expansion. No suspicious calvarial bone marrow signal. Craniocervical junction maintained. IMPRESSION: No acute intracranial process. Slightly larger 11 x 11 x 13 cyst RIGHT parietal benign-appearing cyst, possibly neural glial though, recommend close attention on follow-up imaging. Resolution of RIGHT parietal extra-axial fluid collection, which was most compatible with blood products. Electronically Signed   By: Elon Alas M.D.   On: 01/04/2016 22:14   Dg Chest Port 1 View  Result Date: 01/04/2016 CLINICAL DATA:  Generalized weakness for the last 2 weeks EXAM: PORTABLE CHEST 1 VIEW COMPARISON:  03/24/2013 FINDINGS: Cardiomediastinal silhouette is stable. No acute infiltrate or pleural effusion. No pulmonary edema. Again noted postsurgical changes with metallic fixation rods and transpedicular screws lumbar spine. IMPRESSION: No active disease. Electronically Signed   By: Lahoma Crocker M.D.   On: 01/04/2016 16:38   Dg Fluoro Guide Lumbar Puncture  Result Date: 01/05/2016 CLINICAL DATA:  CT tonic patient. Headaches and  shortness of breath. EXAM: DIAGNOSTIC LUMBAR PUNCTURE UNDER FLUOROSCOPIC GUIDANCE FLUOROSCOPY TIME:  Radiation Exposure Index (as  provided by the fluoroscopic device): Dose area product equals 55.58 mcGy meter squared If the device does not provide the exposure index: Fluoroscopy Time (in minutes and seconds):  0 minutes 24 seconds Number of Acquired Images:  1 last image hold PROCEDURE: Informed consent was obtained from the patient prior to the procedure, including potential complications of headache, allergy, and pain. With the patient prone, the lower back was prepped with Betadine. 1% Lidocaine was used for local anesthesia. Lumbar puncture was performed at the L4-5 level using a 20 gauge needle with return of clear CSF with an opening pressure of 8 cm water. 8 ml of CSF were obtained for laboratory studies. The patient tolerated the procedure well and there were no apparent complications. IMPRESSION: Uncomplicated lumbar puncture performed at the L4-5 level under fluoroscopic guidance. Electronically Signed   By: Lucienne Capers M.D.   On: 01/05/2016 02:34       Assessment and plan per attending neurologist  Etta Quill PA-C [facility] [phone]  01/05/2016, 1:42 PM   Assessment/Plan:

## 2016-01-05 NOTE — Progress Notes (Signed)
PT Cancellation Note  Patient Details Name: Tamara Arnold MRN: [id number] DOB: 07/04/1925   Cancelled Treatment:    Reason Eval/Treat Not Completed: Medical issues which prohibited therapy (Pt nausea with BP elevated per nurse. Will check back tomorrow.)Thanks.    Irwin Brakeman F 01/05/2016, 11:12 AM [facility] [phone] (pager)

## 2016-01-05 NOTE — Evaluation (Signed)
Clinical/Bedside Swallow Evaluation Patient Details  Name: Tamara Arnold MRN: [id number] Date of Birth: 06/04/1925  Today's Date: 01/05/2016 Time: SLP Start Time (ACUTE ONLY): 1100 SLP Stop Time (ACUTE ONLY): 1110 SLP Time Calculation (min) (ACUTE ONLY): 10 min  Past Medical History:  Past Medical History:  Diagnosis Date  . Anemia   . Anxiety   . Arthritis    "neck, back; none in my knees" (03/20/2013)  . Chronic lower back pain   . Daily headache    "all the time for 37 yrs" (03/20/2013)  . Depression   . GERD (gastroesophageal reflux disease)   . History of blood transfusion 02/2013   "related to this recent back OR" (03/20/2013)  . Hyperlipidemia   . Hypertension   . Macular degeneration of both eyes   . Migraine    "not migraines/neurologist" (09/12/2012)  . Shortness of breath    "lately cause my stomach is swollen" (03/20/2013)  . Skin cancer    "on my face alot; had one cut off left leg before; (03/20/2013)  . Type II diabetes mellitus ([place])    off all meds now   Past Surgical History:  Past Surgical History:  Procedure Laterality Date  . ABDOMINAL HYSTERECTOMY    . APPENDECTOMY  1934  . BACK SURGERY  03/10/2013   [facility]-spinal stenosis  . CARPAL TUNNEL RELEASE Right 12/03/2013   Procedure: RIGHT CARPAL TUNNEL RELEASE;  Surgeon: Wynonia Sours, MD;  Location: [facility];  Service: Orthopedics;  Laterality: Right;  . CATARACT EXTRACTION W/ INTRAOCULAR LENS  IMPLANT, BILATERAL Bilateral   . CHOLECYSTECTOMY  1970's?  . CYSTECTOMY     "top of my head; removed @ [facility]; it was benign" (09/12/2012)  . HERNIA REPAIR    . SKIN CANCER EXCISION     [facility]  . TRIGGER FINGER RELEASE Right 12/03/2013   Procedure: RELEASE TRIGGER FINGER/A-1 PULLEY SMALL FINGER;  Surgeon: Wynonia Sours, MD;  Location: [facility];  Service: Orthopedics;  Laterality: Right;  . UMBILICAL HERNIA REPAIR     HPI:  82 y.o.femalewith medical history significant of chronic  severe headaches and some paranoia/psychiatric illness in her later years but overall clear mental status despite her age presenting in a catatonic state. MRI negative for acute event.  Dx HTN; followed by Dr. Eugenie Birks (March 2017) with dx mild dementia, paranoid/delusional thinking.   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 Minimum diameter is approximately 8 mm.    Assessment / Plan / Recommendation Clinical Impression  Pt presents with functional swallow but limited evaluation today due to painful HA and c/o nausea.  Consumed water, purees with multiple sub-swallows per bolus, audible swallow with effort/deliberation perceived by pt; no overt s/s of aspiration.  Pt with hx of cervical esophageal stricture and webbing with difficulty passing pill per esophagram Feb 2014.  There is no documentation re: f/u for esophageal deficits after dx. Recommend initiating a full liquid diet; crush meds and give with puree.  Will follow briefly for toleration and diet progression.      Aspiration Risk  Mild aspiration risk    Diet Recommendation     Medication Administration: Crushed with puree    Other  Recommendations Oral Care Recommendations: Oral care BID   Follow up Recommendations  None    Frequency and Duration min 2x/week  1 week       Prognosis Prognosis for Safe  Diet Advancement: Good      Swallow Study   General Date of Onset: 01/04/16 HPI: 82 y.o.femalewith medical history significant of chronic severe headaches and some paranoia/psychiatric illness in her later years but overall clear mental status despite her age presenting in a catatonic state. MRI negative for acute event.  Dx HTN; followed by Dr. Eugenie Birks (March 2017) with dx mild dementia, paranoid/delusional thinking.   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 Minimum diameter is  approximately 8 mm.  Type of Study: Bedside Swallow Evaluation Previous Swallow Assessment: no Diet Prior to this Study: NPO Temperature Spikes Noted: No Respiratory Status: Nasal cannula History of Recent Intubation: No Behavior/Cognition: Alert;Requires cueing Oral Cavity Assessment: Within Functional Limits Oral Care Completed by SLP: No Oral Cavity - Dentition: Missing dentition Vision: Functional for self-feeding Self-Feeding Abilities: Needs assist Patient Positioning: Upright in bed Baseline Vocal Quality: Normal Volitional Cough: Strong Volitional Swallow: Able to elicit    Oral/Motor/Sensory Function Overall Oral Motor/Sensory Function: Within functional limits   Ice Chips Ice chips: Not tested   Thin Liquid Thin Liquid: Within functional limits (audible swallow; pt appear to require effort) Presentation: Cup;Straw    Nectar Thick Nectar Thick Liquid: Not tested   Honey Thick Honey Thick Liquid: Not tested   Puree Puree: Within functional limits Presentation: Spoon   Solid   GO   Solid: Not tested       Tamara Arnold, [place] CCC/SLP Pager [phone]  Tamara Arnold 01/05/2016,11:21 AM

## 2016-01-05 NOTE — Progress Notes (Signed)
[facility]                                                                              Patient Demographics  Tamara Arnold, is a 82 y.o. female, DOB - Jun 05, 1925, [id number]  Admit date - 01/04/2016   Admitting Physician Karmen Bongo, MD  Outpatient Primary MD for the patient is Stephens Shire, MD  Outpatient specialists:   LOS - 1  days    Chief Complaint  Patient presents with  . Altered Mental Status       Brief summary   Patient is a 82 year old female with chronic headaches, some paranoia/psychiatry can illness, dementia presented with acute encephalopathy. She did have one episode of vomiting prior to admission. Per admission history, was not responding appropriately, no talking with glazed look and staring. Family brought patient to ED for further workup.   Assessment & Plan    Principal Problem: Acute encephalopathy: Unclear cause -CT head, MRI negative for any CVA - UA negative for UTI, LP done, studies negative for any meningitis. Will add HSV PCR to the studies. - Chest x-ray negative for any pneumonia, for now continue IV vancomycin and Zosyn - Obtain EEG for further management, swallow evaluation. - Per patient's granddaughter at the bedside, patient does have history of dementia, lives alone and has been functional up to now, possible worsening of dementia -Follow blood cultures, Follow B1, B12, folate, ceruloplasmin - ammonia level normal - Neurology consult if no significant improvement in next 24 hours  Lactic acidosis, leukocytosis - Continue IV fluid hydration - Continue IV antibiotics follow cultures  Headache with h/o paranoid/delusional thinking -Patient was last seen by neurology (Dr. Jannifer Franklin) in March 2017.  She was showing evidence of paranoid/delusional thinking at that time and also had a score of 22/30 on the MMSE suggesting mild dementia.  HTN -Swallow evaluation, continue outpatient home meds, IV hydralazine as  needed  Constipation -Family reports long-standing constipation, continue Dulcolax suppository as needed   Elevated troponin - Likely due to #1, sepsis physiology - Follow 2-D echo, currently no complaint of chest   Code Status:DO NOT RESUSCITATE  DVT Prophylaxis:  Lovenox  Family Communication: Discussed in detail with the patient, all imaging results, lab results explained to the patient's granddaughter at the bedside    Disposition Plan:   Time Spent in minutes 25 minutes  Procedures:  MRI brain LP   Consultants:   None   Antimicrobials:   IV vanc  IV zosyn   Medications  Scheduled Meds: . amLODipine  5 mg Oral Daily  . aspirin  300 mg Rectal Daily   Or  . aspirin  325 mg Oral Daily  . enoxaparin (LOVENOX) injection  40 mg Subcutaneous QHS  . lisinopril  40 mg Oral Daily  . multivitamin  1 tablet Oral Daily  . piperacillin-tazobactam (ZOSYN)  IV  3.375 g Intravenous Q8H  . polyethylene glycol  34 g Oral Daily  . vancomycin  1,000 mg Intravenous Q24H   Continuous Infusions: . sodium chloride 50 mL/hr at 01/04/16 2241   PRN Meds:.acetaminophen **OR** acetaminophen, bisacodyl, hydrALAZINE, labetalol, LORazepam, morphine injection, ondansetron (ZOFRAN) IV  Antibiotics   Anti-infectives    Start     Dose/Rate Route Frequency Ordered Stop   01/05/16 2200  vancomycin (VANCOCIN) IVPB 1000 mg/200 mL premix     1,000 mg 200 mL/hr over 60 Minutes Intravenous Every 24 hours 01/04/16 2240     01/05/16 0600  piperacillin-tazobactam (ZOSYN) IVPB 3.375 g     3.375 g 12.5 mL/hr over 240 Minutes Intravenous Every 8 hours 01/04/16 2240     01/04/16 2245  vancomycin (VANCOCIN) 1,250 mg in sodium chloride 0.9 % 250 mL IVPB     1,250 mg 166.7 mL/hr over 90 Minutes Intravenous  Once 01/04/16 2240 01/05/16 0326   01/04/16 2245  piperacillin-tazobactam (ZOSYN) IVPB 3.375 g     3.375 g 100 mL/hr over 30 Minutes Intravenous  Once 01/04/16 2240 01/05/16 0232         Subjective:   Candece Riner was seen and examined today. At the time of my examination, somnolent, not responding to any commands. Per granddaughter at the bedside, she was agitated and anxious overnight and had just fallen asleep. No fevers, unable to obtain the venous system from the patient    Objective:   Vitals:   01/05/16 0306 01/05/16 0510 01/05/16 0931 01/05/16 1025  BP: (!) 174/62 (!) 184/63 (!) 193/96 (!) 194/67  Pulse:  (!) 102 (!) 107 95  Resp:  (!) 26 (!) 22   Temp:  97.5 F (36.4 C) 97.8 F (36.6 C)   TempSrc:  Oral Oral   SpO2:  95% 95%   Weight:      Height:        Intake/Output Summary (Last 24 hours) at 01/05/16 1124 Last data filed at 01/05/16 0400  Gross per 24 hour  Intake              500 ml  Output             2100 ml  Net            -1600 ml     Wt Readings from Last 3 Encounters:  01/04/16 63.5 kg (140 lb)  07/22/15 58.1 kg (128 lb)  12/01/13 64.4 kg (142 lb)     Exam  General:Somnolent   HEENT:  PERRLA, EOMI, Anicteric Sclera, mucous membranes moist.   Neck: Supple, no JVD, no masses  Cardiovascular: S1 S2 auscultated, no rubs, murmurs or gallops. Regular rate and rhythm.  Respiratory: Clear to auscultation bilaterally, no wheezing, rales or rhonchi  Gastrointestinal: Soft, nontender, nondistended, + bowel sounds  Ext: no cyanosis clubbing or edema  Neuro:unable to assess   Skin: No rashes  Psych:somnolent  Data Reviewed:  I have personally reviewed following labs and imaging studies  Micro Results Recent Results (from the past 240 hour(s))  CSF culture with Stat gram stain     Status: None (Preliminary result)   Collection Time: 01/05/16  1:00 AM  Result Value Ref Range Status   Specimen Description CSF  Final   Special Requests Normal  Final   Gram Stain   Final    CYTOSPIN SMEAR WBC PRESENT,BOTH PMN AND MONONUCLEAR NO ORGANISMS SEEN    Culture PENDING  Incomplete   Report Status PENDING  Incomplete     Radiology Reports Ct Head Wo Contrast  Result Date: 01/04/2016 CLINICAL DATA:  Altered mental status. Generalized weakness over the last 1-2 weeks. EXAM: CT HEAD WITHOUT CONTRAST TECHNIQUE: Contiguous axial images were obtained from the base of the skull through the vertex without intravenous  contrast. COMPARISON:  CT head without contrast 05/27/2015. FINDINGS: Brain: A tiny arachnoid cyst is again noted near the vertex on the right. No acute infarct, hemorrhage, or mass lesion is present. Minimal atrophy and white matter disease is within normal limits for age. The basal ganglia and insular ribbon are normal. No focal cortical lesions are present. Vascular: Atherosclerotic calcifications are present within the cavernous internal carotid arteries and at the dural margin of the vertebral arteries bilaterally. Skull: The calvarium is intact. Sinuses/Orbits: A polyp or mucous retention cyst within the right sphenoid sinus is stable. The remaining paranasal sinuses and the mastoid air cells are clear. The patient is status post bilateral lens replacements. The globes and orbits are otherwise intact. Other: No significant extracranial soft tissue lesion is present. IMPRESSION: 1. No acute intracranial abnormality or significant interval change. 2. Atherosclerosis. Electronically Signed   By: San Morelle M.D.   On: 01/04/2016 16:57   Mr Brain W Or Wo Contrast  Result Date: 01/04/2016 CLINICAL DATA:  Last seen normal at 1800 hours yesterday. Headache, altered mental status. Generalized weakness for 1-2 weeks. History of hypertension, hyperlipidemia, migraines, diabetes. EXAM: MRI HEAD WITHOUT AND WITH CONTRAST TECHNIQUE: Multiplanar, multiecho pulse sequences of the brain and surrounding structures were obtained without and with intravenous contrast. CONTRAST:  6mL MULTIHANCE GADOBENATE DIMEGLUMINE 529 MG/ML IV SOLN COMPARISON:  CT HEAD January 04, 2016 at 1641 hours and MRI head November 29, 2011 and CT  HEAD February 14, 2010 FINDINGS: INTRACRANIAL CONTENTS: No reduced diffusion to suggest acute ischemia. No susceptibility artifact to suggest hemorrhage. The ventricles and sulci are normal for patient's age. Minimal white matter changes compatible chronic small vessel ischemic disease, less than expected for age. No suspicious parenchymal signal, mass effect. Stable 11 x 13 x 13 mm homogeneously bright T2 RIGHT parietal cortical based lesion completely suppresses on FLAIR sequence, without enhancement or solid component. No abnormal intraparenchymal or extra-axial enhancement. No abnormal extra-axial fluid collections. Resolution of RIGHT parietal/parafalcine fluid collection with very minimal hemosiderin staining. No extra-axial masses. Normal major intracranial vascular flow voids present at skull base. ORBITS: The included ocular globes and orbital contents are non-suspicious. Status post bilateral ocular lens implants. SINUSES: Small LEFT sphenoid mucosal retention cyst. Trace RIGHT mastoid effusion. SKULL/SOFT TISSUES: No abnormal sellar expansion. No suspicious calvarial bone marrow signal. Craniocervical junction maintained. IMPRESSION: No acute intracranial process. Slightly larger 11 x 11 x 13 cyst RIGHT parietal benign-appearing cyst, possibly neural glial though, recommend close attention on follow-up imaging. Resolution of RIGHT parietal extra-axial fluid collection, which was most compatible with blood products. Electronically Signed   By: Elon Alas M.D.   On: 01/04/2016 22:14   Dg Chest Port 1 View  Result Date: 01/04/2016 CLINICAL DATA:  Generalized weakness for the last 2 weeks EXAM: PORTABLE CHEST 1 VIEW COMPARISON:  03/24/2013 FINDINGS: Cardiomediastinal silhouette is stable. No acute infiltrate or pleural effusion. No pulmonary edema. Again noted postsurgical changes with metallic fixation rods and transpedicular screws lumbar spine. IMPRESSION: No active disease. Electronically  Signed   By: Lahoma Crocker M.D.   On: 01/04/2016 16:38   Dg Fluoro Guide Lumbar Puncture  Result Date: 01/05/2016 CLINICAL DATA:  CT tonic patient. Headaches and shortness of breath. EXAM: DIAGNOSTIC LUMBAR PUNCTURE UNDER FLUOROSCOPIC GUIDANCE FLUOROSCOPY TIME:  Radiation Exposure Index (as provided by the fluoroscopic device): Dose area product equals 55.58 mcGy meter squared If the device does not provide the exposure index: Fluoroscopy Time (in minutes and seconds):  0 minutes 24  seconds Number of Acquired Images:  1 last image hold PROCEDURE: Informed consent was obtained from the patient prior to the procedure, including potential complications of headache, allergy, and pain. With the patient prone, the lower back was prepped with Betadine. 1% Lidocaine was used for local anesthesia. Lumbar puncture was performed at the L4-5 level using a 20 gauge needle with return of clear CSF with an opening pressure of 8 cm water. 8 ml of CSF were obtained for laboratory studies. The patient tolerated the procedure well and there were no apparent complications. IMPRESSION: Uncomplicated lumbar puncture performed at the L4-5 level under fluoroscopic guidance. Electronically Signed   By: Lucienne Capers M.D.   On: 01/05/2016 02:34    Lab Data:  CBC:  Recent Labs Lab 01/04/16 1600 01/04/16 1813 01/05/16 0916  WBC 18.6*  --  23.0*  NEUTROABS 15.3*  --   --   HGB 13.6 13.6 11.4*  HCT 39.9 40.0 33.9*  MCV 86.0  --  86.5  PLT 275  --  123456   Basic Metabolic Panel:  Recent Labs Lab 01/04/16 1600 01/04/16 1813 01/05/16 0916  NA 133* 136 136  K 3.0* 3.1* 3.4*  CL 92* 93* 101  CO2 22  --  25  GLUCOSE 180* 186* 219*  BUN 21* 21* 17  CREATININE 1.10* 0.80 0.84  CALCIUM 10.4*  --  9.2   GFR: Estimated Creatinine Clearance: 41.5 mL/min (by C-G formula based on SCr of 0.84 mg/dL). Liver Function Tests:  Recent Labs Lab 01/04/16 1600 01/05/16 0916  AST 32 24  ALT 16 14  ALKPHOS 37* 32*  BILITOT  1.2 0.6  PROT 7.9 6.4*  ALBUMIN 4.0 3.2*    Recent Labs Lab 01/04/16 1600  LIPASE 27    Recent Labs Lab 01/04/16 1600 01/05/16 0916  AMMONIA 26 22   Coagulation Profile:  Recent Labs Lab 01/04/16 1600  INR 1.02   Cardiac Enzymes:  Recent Labs Lab 01/04/16 1757 01/04/16 2249 01/05/16 0309 01/05/16 0916  CKTOTAL 60  --   --   --   TROPONINI  --  0.15* 0.16* 0.19*  0.18*   BNP (last 3 results) No results for input(s): PROBNP in the last 8760 hours. HbA1C: No results for input(s): HGBA1C in the last 72 hours. CBG:  Recent Labs Lab 01/04/16 2031 01/04/16 2233  GLUCAP 182* 188*   Lipid Profile:  Recent Labs  01/05/16 0309  CHOL 133  HDL 82  LDLCALC 40  TRIG 55  CHOLHDL 1.6   Thyroid Function Tests:  Recent Labs  01/04/16 2005  TSH 0.765   Anemia Panel:  Recent Labs  01/05/16 0916  VITAMINB12 1,295*   Urine analysis:    Component Value Date/Time   COLORURINE YELLOW 01/04/2016 [place] 01/04/2016 1740   LABSPEC 1.020 01/04/2016 1740   PHURINE 6.5 01/04/2016 1740   GLUCOSEU NEGATIVE 01/04/2016 1740   HGBUR NEGATIVE 01/04/2016 1740   BILIRUBINUR NEGATIVE 01/04/2016 1740   KETONESUR 15 (A) 01/04/2016 1740   PROTEINUR >300 (A) 01/04/2016 1740   UROBILINOGEN 0.2 03/20/2013 1403   NITRITE NEGATIVE 01/04/2016 1740   LEUKOCYTESUR NEGATIVE 01/04/2016 1740     Fue Cervenka M.D. [facility] 01/05/2016, 11:24 AM  Pager: [phone] Between 7am to 7pm - call Pager - [phone]  After 7pm go to [email or web address] - password [email or web address]  Call night coverage person covering after 7pm

## 2016-01-05 NOTE — Progress Notes (Signed)
Bladder scan showed 500cc, Rai, R MD notified, verbal order received for in and out cath. This RN was instructed to redo a bladder scan 4 hours later.Verbal  order given to start a foley cath if bladder scan shows above 200cc.  500cc of urine was removed at 1400, will continue to monitor.

## 2016-01-05 NOTE — Progress Notes (Signed)
Critical value received:  Lactic acid 2.2.  NP Baltazar Najjar paged/notified.  No new orders at this time.  RN will continue to monitor.

## 2016-01-05 NOTE — Procedures (Signed)
Lumbar Puncture Procedure Note  Pre-operative Diagnosis: Catatonia  Post-operative Diagnosis: Catatonia  Indications: Diagnostic  Procedure Details   Consent: Informed consent was obtained. Risks of the procedure were discussed including: infection, bleeding, pain and headache.  The patient was positioned under sterile conditions. Betadine solution and sterile drapes were utilized. A spinal needle was inserted at the L4-5 interspace.  Spinal fluid was obtained and sent to the laboratory.  Findings 8 mL of clear spinal fluid was obtained. Opening Pressure: 8 cm H2O pressure.   Complications:  None; patient tolerated the procedure well.        Condition: stable  Plan Bed rest for 4 hours. Tylenol 650 mg for pain. Call on call physician if you develop a severe headache, nausea, vomiting, or fever greater than 100.5 F.

## 2016-01-05 NOTE — Progress Notes (Signed)
Bladder scan showed 603cc.  NP Baltazar Najjar notified, in and out cath done, 700 cc removed.

## 2016-01-06 ENCOUNTER — Inpatient Hospital Stay (HOSPITAL_COMMUNITY): Payer: PPO

## 2016-01-06 ENCOUNTER — Other Ambulatory Visit (HOSPITAL_COMMUNITY): Payer: Self-pay

## 2016-01-06 ENCOUNTER — Encounter (HOSPITAL_COMMUNITY): Payer: Self-pay | Admitting: Radiology

## 2016-01-06 DIAGNOSIS — Z8659 Personal history of other mental and behavioral disorders: Secondary | ICD-10-CM | POA: Diagnosis not present

## 2016-01-06 DIAGNOSIS — R000 Tachycardia, unspecified: Secondary | ICD-10-CM | POA: Diagnosis not present

## 2016-01-06 DIAGNOSIS — E876 Hypokalemia: Secondary | ICD-10-CM | POA: Diagnosis not present

## 2016-01-06 DIAGNOSIS — R51 Headache: Secondary | ICD-10-CM

## 2016-01-06 DIAGNOSIS — K5901 Slow transit constipation: Secondary | ICD-10-CM | POA: Diagnosis not present

## 2016-01-06 DIAGNOSIS — F061 Catatonic disorder due to known physiological condition: Secondary | ICD-10-CM

## 2016-01-06 DIAGNOSIS — I10 Essential (primary) hypertension: Secondary | ICD-10-CM

## 2016-01-06 DIAGNOSIS — E785 Hyperlipidemia, unspecified: Secondary | ICD-10-CM | POA: Diagnosis not present

## 2016-01-06 DIAGNOSIS — R109 Unspecified abdominal pain: Secondary | ICD-10-CM | POA: Diagnosis not present

## 2016-01-06 LAB — CBC
HCT: 33.9 % — ABNORMAL LOW (ref 36.0–46.0)
HEMOGLOBIN: 11 g/dL — AB (ref 12.0–15.0)
MCH: 28.1 pg (ref 26.0–34.0)
MCHC: 32.4 g/dL (ref 30.0–36.0)
MCV: 86.5 fL (ref 78.0–100.0)
Platelets: 282 10*3/uL (ref 150–400)
RBC: 3.92 MIL/uL (ref 3.87–5.11)
RDW: 16.4 % — ABNORMAL HIGH (ref 11.5–15.5)
WBC: 18.6 10*3/uL — ABNORMAL HIGH (ref 4.0–10.5)

## 2016-01-06 LAB — BASIC METABOLIC PANEL WITH GFR
Anion gap: 9 (ref 5–15)
BUN: 15 mg/dL (ref 6–20)
CO2: 26 mmol/L (ref 22–32)
Calcium: 9.1 mg/dL (ref 8.9–10.3)
Chloride: 102 mmol/L (ref 101–111)
Creatinine, Ser: 0.89 mg/dL (ref 0.44–1.00)
GFR calc Af Amer: >60 mL/min
GFR calc non Af Amer: 56 mL/min — ABNORMAL LOW
Glucose, Bld: 172 mg/dL — ABNORMAL HIGH (ref 65–99)
Potassium: 3.1 mmol/L — ABNORMAL LOW (ref 3.5–5.1)
Sodium: 137 mmol/L (ref 135–145)

## 2016-01-06 LAB — HEMOGLOBIN A1C
Hgb A1c MFr Bld: 6.3 % — ABNORMAL HIGH (ref 4.8–5.6)
MEAN PLASMA GLUCOSE: 134 mg/dL

## 2016-01-06 LAB — HSV DNA BY PCR (REFERENCE LAB)
HSV 1 DNA: NEGATIVE
HSV 2 DNA: NEGATIVE

## 2016-01-06 LAB — CERULOPLASMIN: CERULOPLASMIN: 25.5 mg/dL (ref 19.0–39.0)

## 2016-01-06 MED ORDER — AMLODIPINE BESYLATE 5 MG PO TABS: 5.0000 mg | ORAL_TABLET | Freq: Every day | ORAL | Status: DC

## 2016-01-06 MED ORDER — DILTIAZEM HCL ER COATED BEADS 120 MG PO CP24
120.0000 mg | ORAL_CAPSULE | Freq: Every day | ORAL | Status: DC
  Administered 2016-01-06: 120 mg via ORAL
  Filled 2016-01-06 (×2): qty 1

## 2016-01-06 MED ORDER — BISACODYL 10 MG RE SUPP
10.0000 mg | Freq: Once | RECTAL | Status: AC
  Administered 2016-01-06: 10 mg via RECTAL
  Filled 2016-01-06: qty 1

## 2016-01-06 MED ORDER — METOPROLOL TARTRATE 25 MG PO TABS
25.0000 mg | ORAL_TABLET | Freq: Three times a day (TID) | ORAL | Status: DC
  Administered 2016-01-06 – 2016-01-07 (×2): 25 mg via ORAL
  Filled 2016-01-06 (×3): qty 1

## 2016-01-06 MED ORDER — POTASSIUM CHLORIDE CRYS ER 20 MEQ PO TBCR: 40.0000 meq | EXTENDED_RELEASE_TABLET | Freq: Once | ORAL | Status: DC

## 2016-01-06 MED ORDER — POTASSIUM CHLORIDE 20 MEQ/15ML (10%) PO SOLN
40.0000 meq | Freq: Once | ORAL | Status: AC
  Administered 2016-01-06: 40 meq via ORAL
  Filled 2016-01-06: qty 30

## 2016-01-06 MED ORDER — HYDRALAZINE HCL 50 MG PO TABS
50.0000 mg | ORAL_TABLET | Freq: Four times a day (QID) | ORAL | Status: DC
  Administered 2016-01-06 – 2016-01-11 (×15): 50 mg via ORAL
  Filled 2016-01-06 (×19): qty 1

## 2016-01-06 MED ORDER — IOPAMIDOL (ISOVUE-300) INJECTION 61%
INTRAVENOUS | Status: AC
  Administered 2016-01-06: 80 mL
  Filled 2016-01-06: qty 100

## 2016-01-06 MED ORDER — SENNOSIDES-DOCUSATE SODIUM 8.6-50 MG PO TABS
1.0000 | ORAL_TABLET | Freq: Two times a day (BID) | ORAL | Status: DC
  Administered 2016-01-06 (×2): 1 via ORAL
  Filled 2016-01-06 (×3): qty 1

## 2016-01-06 MED ORDER — METOPROLOL TARTRATE 5 MG/5ML IV SOLN
2.5000 mg | INTRAVENOUS | Status: DC | PRN
  Administered 2016-01-07 (×3): 2.5 mg via INTRAVENOUS
  Filled 2016-01-06 (×3): qty 5

## 2016-01-06 NOTE — Evaluation (Signed)
Occupational Therapy Evaluation Patient Details Name: Tamara Arnold MRN: [id number] DOB: 1925-10-18 Today's Date: 01/06/2016    History of Present Illness 82 y.o.femalewith medical history significant of chronic severe headaches and some paranoia/psychiatric illness in her later years but overall clear mental status despite her age presenting in a catatonic state. MRI negative for acute event.  Dx HTN; followed by Dr. Eugenie Birks (March 2017) with dx mild dementia, paranoid/delusional thinking.   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 Minimum diameter is approximately 8 mm.     Clinical Impression   PTA, pt was independent with ADLs and used rollator for mobility. Pt currently presents with generalized weakness, acute back pain, and nausea and required +2 assist for bed mobility and setup assist for ADLs sitting EOB. Pt agrees she will need 24/7 assistance after discharge, but is unsure who would be able to provide that for her. Currently recommending SNF for post-acute rehab. Pt will benefit from continued acute OT to increase independence and safety with ADLs and mobility to allow for safe discharge to the venue listed below.     Follow Up Recommendations  SNF;Supervision/Assistance - 24 hour    Equipment Recommendations  Other (comment) (TBD at next venue)    Recommendations for Other Services       Precautions / Restrictions Precautions Precautions: Fall Restrictions Weight Bearing Restrictions: No      Mobility Bed Mobility Overal bed mobility: Needs Assistance;+2 for physical assistance Bed Mobility: Rolling;Sidelying to Sit;Sit to Sidelying Rolling: Min guard Sidelying to sit: Mod assist;+2 for physical assistance     Sit to sidelying: Mod assist;+2 for physical assistance General bed mobility comments: Used bed pad to assist with scooting to EOB.  Needed asssist for LEs and elevation of trunk but  feel pt could have done more.    Transfers                      Balance Overall balance assessment: Needs assistance Sitting-balance support: No upper extremity supported;Feet unsupported Sitting balance-Leahy Scale: Fair Sitting balance - Comments: Pt varied in level of assist needed.  Never got feet to floor.  Left knee and hip limitations noted.  Pt sat a total 10 minutes with min guard to mod assist initially until pt obtained balance.  SEe OT note for face washing.  Did a few exercises at EOB.                                     ADL Overall ADL's : Needs assistance/impaired Eating/Feeding: Set up;Bed level Eating/Feeding Details (indicate cue type and reason): sitting up in bed; cues for small sips Grooming: Wash/dry hands;Wash/dry face;Minimal assistance;Sitting;Cueing for sequencing Grooming Details (indicate cue type and reason): assist for thoroughness; verbal cues to continue with task - pt easily distracted                               General ADL Comments: Pt declined to mobilize past sitting EOB due to back pain and nausea     Vision Vision Assessment?: No apparent visual deficits   Perception     Praxis      Pertinent Vitals/Pain Pain Assessment: Faces Faces Pain Scale: Hurts even more Pain Location: headache Pain Descriptors / Indicators: Headache Pain Intervention(s):  Limited activity within patient's tolerance;Monitored during session;Repositioned     Hand Dominance Right   Extremity/Trunk Assessment Upper Extremity Assessment Upper Extremity Assessment: Generalized weakness   Lower Extremity Assessment Lower Extremity Assessment: Defer to PT evaluation RLE Deficits / Details: grossly 3-/5 LLE Deficits / Details: grossly 2+/5 with hip limitations possibly but pt would not allow full testing.    Cervical / Trunk Assessment Cervical / Trunk Assessment: Kyphotic   Communication Communication Communication: Other  (comment) (speaks softly)   Cognition Arousal/Alertness: Awake/alert Behavior During Therapy: Flat affect;Anxious Overall Cognitive Status: History of cognitive impairments - at baseline                     General Comments       Exercises Exercises: General Lower Extremity     Shoulder Instructions      Home Living Family/patient expects to be discharged to:: Skilled nursing facility Living Arrangements: Alone Available Help at Discharge: Personal care attendant (4 days/week from 11am-2pm) Type of Home: House Home Access: Stairs to enter [employer] of Steps: 1 Entrance Stairs-Rails: None Home Layout: One level               Home Equipment: Bedside commode;Walker - 4 wheels;Cane - single point;Adaptive equipment Adaptive Equipment: Reacher    Lives With: Alone    Prior Functioning/Environment Level of Independence: Needs assistance  Gait / Transfers Assistance Needed: pt ambulates with rollator  ADL's / Homemaking Assistance Needed: Independent with ADLs, PCA assists with homemaking tasks per pt report        OT Diagnosis: Generalized weakness;Cognitive deficits;Acute pain   OT Problem List: Decreased strength;Decreased activity tolerance;Impaired balance (sitting and/or standing);Decreased cognition;Decreased safety awareness;Decreased knowledge of use of DME or AE;Pain   OT Treatment/Interventions: Self-care/ADL training;Therapeutic exercise;DME and/or AE instruction;Therapeutic activities;Cognitive remediation/compensation;Balance training;Patient/family education    OT Goals(Current goals can be found in the care plan section) Acute Rehab OT Goals Patient Stated Goal: go somewhere where I can get help OT Goal Formulation: With patient Time For Goal Achievement: 01/20/16 Potential to Achieve Goals: Good ADL Goals Pt Will Perform Grooming: standing;with min assist Pt Will Perform Upper Body Bathing: with min guard assist;sitting Pt Will  Perform Lower Body Bathing: with min assist;sit to/from stand Pt Will Transfer to Toilet: with min assist;ambulating;bedside commode (over toilet) Pt Will Perform Toileting - Clothing Manipulation and hygiene: with min assist;sit to/from stand  OT Frequency: Min 2X/week   Barriers to D/C: Decreased caregiver support  Lives alone, PCA assist intermittently, family cannot assist per pt report       Co-evaluation PT/OT/SLP Co-Evaluation/Treatment: Yes Reason for Co-Treatment: For patient/therapist safety PT goals addressed during session: Mobility/safety with mobility OT goals addressed during session: ADL's and self-care;Strengthening/ROM      End of Session Nurse Communication: Mobility status  Activity Tolerance: Patient limited by pain Patient left: in bed;with call bell/phone within reach;with bed alarm set   Time: [id number] OT Time Calculation (min): 32 min Charges:  OT General Charges $OT Visit: 1 Procedure OT Evaluation $OT Eval Moderate Complexity: 1 Procedure G-Codes:    Redmond Baseman, OTR/L PagerUD:[phone] 01/06/2016, 11:09 AM

## 2016-01-06 NOTE — Progress Notes (Signed)
[facility]                                                                              Patient Demographics  Tamara Arnold, is a 82 y.o. female, DOB - [birth year], [id number]  Admit date - 01/04/2016   Admitting Physician Karmen Bongo, MD  Outpatient Primary MD for the patient is Stephens Shire, MD  Outpatient specialists:   LOS - 2  days    Chief Complaint  Patient presents with  . Altered Mental Status       Brief summary   Patient is a 82 year old female with chronic headaches, some paranoia/psychiatry can illness, dementia presented with acute encephalopathy. She did have one episode of vomiting prior to admission. Per admission history, was not responding appropriately, no talking with glazed look and staring. Family brought patient to ED for further workup.   Assessment & Plan    Principal Problem: Acute encephalopathy: Unclear cause, Improving, appears to be closer to her baseline, alert today and communicative -CT head, MRI negative for any CVA - UA negative for UTI, LP done, studies negative for any meningitis. Will add HSV PCR to the studies. - Chest x-ray negative for any pneumonia, for now continue IV vancomycin and Zosyn - EEG consistent with possible mild encephalopathy, no seizure - Per patient's granddaughter at the bedside, patient does have history of dementia, lives alone and has been functional up to now, possible worsening of dementia -B-1 pending, ceruloplasmin, B12, folate normal  - ammonia level normal -  will consult psychiatry for possible depression  Lactic acidosis, leukocytosis - Unclear etiology of any underlying infectious source, obtain CT abdomen as patient continues to complain of abdominal pain and nausea - Continue IV fluid hydration - Continue IV antibiotics, Blood cultures negative so far, CSF cultures negative  Headache with h/o paranoid/delusional thinking -Patient was last seen by neurology (Dr. Jannifer Franklin)  in March 2017.  She was showing evidence of paranoid/delusional thinking at that time and also had a score of 22/30 on the MMSE suggesting mild dementia.  HTN uncontrolled -Restarted outpatient antihypertensives  Constipation -Family reports long-standing constipation, continue Dulcolax suppository as needed   Elevated troponin - Likely due to #1, sepsis physiology - Follow 2-D echo, currently no complaint of chest pain   Code Status:DO NOT RESUSCITATE  DVT Prophylaxis:  Lovenox  Family Communication: No family member at the bedside Disposition Plan: Will likely need skilled nursing facility, I had explained it to the family member at the bedside yesterday  Time Spent in minutes 25 minutes  Procedures:  MRI brain LP   Consultants:   None   Antimicrobials:   IV vanc  IV zosyn   Medications  Scheduled Meds: . amLODipine  5 mg Oral Daily  . aspirin  300 mg Rectal Daily   Or  . aspirin  325 mg Oral Daily  . enoxaparin (LOVENOX) injection  40 mg Subcutaneous QHS  . hydrALAZINE  50 mg Oral QID  . lisinopril  40 mg Oral Daily  . metoprolol  2.5 mg Intravenous Q6H  . multivitamin  1 tablet Oral Daily  . piperacillin-tazobactam (ZOSYN)  IV  3.375 g Intravenous Q8H  . polyethylene glycol  34 g Oral Daily  . vancomycin  1,000 mg Intravenous Q24H   Continuous Infusions: . sodium chloride 50 mL/hr at 01/06/16 0710   PRN Meds:.acetaminophen **OR** acetaminophen, bisacodyl, hydrALAZINE, labetalol, LORazepam, morphine injection, ondansetron (ZOFRAN) IV   Antibiotics   Anti-infectives    Start     Dose/Rate Route Frequency Ordered Stop   01/05/16 2200  vancomycin (VANCOCIN) IVPB 1000 mg/200 mL premix     1,000 mg 200 mL/hr over 60 Minutes Intravenous Every 24 hours 01/04/16 2240     01/05/16 0600  piperacillin-tazobactam (ZOSYN) IVPB 3.375 g     3.375 g 12.5 mL/hr over 240 Minutes Intravenous Every 8 hours 01/04/16 2240     01/04/16 2245  vancomycin (VANCOCIN)  1,250 mg in sodium chloride 0.9 % 250 mL IVPB     1,250 mg 166.7 mL/hr over 90 Minutes Intravenous  Once 01/04/16 2240 01/05/16 0326   01/04/16 2245  piperacillin-tazobactam (ZOSYN) IVPB 3.375 g     3.375 g 100 mL/hr over 30 Minutes Intravenous  Once 01/04/16 2240 01/05/16 0232        Subjective:   Tamara Arnold was seen and examined today. Much more alert and oriented today, still feels nauseous and abdominal pain. No fevers or chills. BP somewhat uncontrolled. No chest pain, vomiting, diarrhea or coughing Objective:   Vitals:   01/05/16 2030 01/05/16 2200 01/05/16 2325 01/06/16 0609  BP: (!) 171/72 (!) 178/77 (!) 175/65 (!) 181/68  Pulse:   (!) 112 99  Resp:   18 18  Temp:   98.5 F (36.9 C) 99.8 F (37.7 C)  TempSrc:   Oral Oral  SpO2:   100% 100%  Weight:      Height:        Intake/Output Summary (Last 24 hours) at 01/06/16 1144 Last data filed at 01/06/16 0730  Gross per 24 hour  Intake             1750 ml  Output             1050 ml  Net              700 ml     Wt Readings from Last 3 Encounters:  01/04/16 63.5 kg (140 lb)  07/22/15 58.1 kg (128 lb)  12/01/13 64.4 kg (142 lb)     Exam  General:Alert and oriented 2  HEENT:    Neck: Supple, no JVD, no masses  Cardiovascular: S1 S2 auscultated, no rubs, murmurs or gallops. Regular rate and rhythm.  Respiratory: Clear to auscultation bilaterally, no wheezing, rales or rhonchi  Gastrointestinal: Soft, nontender, nondistended, + bowel sounds  Ext: no cyanosis clubbing or edema  Neuro: did not cooperate with exam ( I feel weak)  Skin: No rashes  Psych: much more alert and talkative today  Data Reviewed:  I have personally reviewed following labs and imaging studies  Micro Results Recent Results (from the past 240 hour(s))  Culture, blood (routine x 2)     Status: None (Preliminary result)   Collection Time: 01/04/16  4:00 PM  Result Value Ref Range Status   Specimen Description BLOOD LEFT  HAND  Final   Special Requests BOTTLES DRAWN AEROBIC AND ANAEROBIC 5CC  Final   Culture NO GROWTH < 24 HOURS  Final   Report Status PENDING  Incomplete  Culture, blood (routine x 2)     Status: None (Preliminary result)   Collection Time: 01/04/16  4:24 PM  Result Value Ref Range Status   Specimen Description BLOOD RIGHT HAND  Final   Special Requests BOTTLES DRAWN AEROBIC AND ANAEROBIC 5CC  Final   Culture NO GROWTH < 24 HOURS  Final   Report Status PENDING  Incomplete  Urine culture     Status: None   Collection Time: 01/04/16  5:41 PM  Result Value Ref Range Status   Specimen Description URINE, RANDOM  Final   Special Requests NONE  Final   Culture NO GROWTH  Final   Report Status 01/05/2016 FINAL  Final  CSF culture with Stat gram stain     Status: None (Preliminary result)   Collection Time: 01/05/16  1:00 AM  Result Value Ref Range Status   Specimen Description CSF  Final   Special Requests Normal  Final   Gram Stain   Final    CYTOSPIN SMEAR WBC PRESENT,BOTH PMN AND MONONUCLEAR NO ORGANISMS SEEN    Culture PENDING  Incomplete   Report Status PENDING  Incomplete    Radiology Reports Ct Head Wo Contrast  Result Date: 01/04/2016 CLINICAL DATA:  Altered mental status. Generalized weakness over the last 1-2 weeks. EXAM: CT HEAD WITHOUT CONTRAST TECHNIQUE: Contiguous axial images were obtained from the base of the skull through the vertex without intravenous contrast. COMPARISON:  CT head without contrast 05/27/2015. FINDINGS: Brain: A tiny arachnoid cyst is again noted near the vertex on the right. No acute infarct, hemorrhage, or mass lesion is present. Minimal atrophy and white matter disease is within normal limits for age. The basal ganglia and insular ribbon are normal. No focal cortical lesions are present. Vascular: Atherosclerotic calcifications are present within the cavernous internal carotid arteries and at the dural margin of the vertebral arteries bilaterally. Skull:  The calvarium is intact. Sinuses/Orbits: A polyp or mucous retention cyst within the right sphenoid sinus is stable. The remaining paranasal sinuses and the mastoid air cells are clear. The patient is status post bilateral lens replacements. The globes and orbits are otherwise intact. Other: No significant extracranial soft tissue lesion is present. IMPRESSION: 1. No acute intracranial abnormality or significant interval change. 2. Atherosclerosis. Electronically Signed   By: San Morelle M.D.   On: 01/04/2016 16:57   Mr Brain W Or Wo Contrast  Result Date: 01/04/2016 CLINICAL DATA:  Last seen normal at 1800 hours yesterday. Headache, altered mental status. Generalized weakness for 1-2 weeks. History of hypertension, hyperlipidemia, migraines, diabetes. EXAM: MRI HEAD WITHOUT AND WITH CONTRAST TECHNIQUE: Multiplanar, multiecho pulse sequences of the brain and surrounding structures were obtained without and with intravenous contrast. CONTRAST:  51mL MULTIHANCE GADOBENATE DIMEGLUMINE 529 MG/ML IV SOLN COMPARISON:  CT HEAD January 04, 2016 at 1641 hours and MRI head November 29, 2011 and CT HEAD February 14, 2010 FINDINGS: INTRACRANIAL CONTENTS: No reduced diffusion to suggest acute ischemia. No susceptibility artifact to suggest hemorrhage. The ventricles and sulci are normal for patient's age. Minimal white matter changes compatible chronic small vessel ischemic disease, less than expected for age. No suspicious parenchymal signal, mass effect. Stable 11 x 13 x 13 mm homogeneously bright T2 RIGHT parietal cortical based lesion completely suppresses on FLAIR sequence, without enhancement or solid component. No abnormal intraparenchymal or extra-axial enhancement. No abnormal extra-axial fluid collections. Resolution of RIGHT parietal/parafalcine fluid collection with very minimal hemosiderin staining. No extra-axial masses. Normal major intracranial vascular flow voids present at skull base. ORBITS: The  included ocular globes and orbital contents are non-suspicious. Status post bilateral ocular lens  implants. SINUSES: Small LEFT sphenoid mucosal retention cyst. Trace RIGHT mastoid effusion. SKULL/SOFT TISSUES: No abnormal sellar expansion. No suspicious calvarial bone marrow signal. Craniocervical junction maintained. IMPRESSION: No acute intracranial process. Slightly larger 11 x 11 x 13 cyst RIGHT parietal benign-appearing cyst, possibly neural glial though, recommend close attention on follow-up imaging. Resolution of RIGHT parietal extra-axial fluid collection, which was most compatible with blood products. Electronically Signed   By: Elon Alas M.D.   On: 01/04/2016 22:14   Dg Chest Port 1 View  Result Date: 01/04/2016 CLINICAL DATA:  Generalized weakness for the last 2 weeks EXAM: PORTABLE CHEST 1 VIEW COMPARISON:  03/24/2013 FINDINGS: Cardiomediastinal silhouette is stable. No acute infiltrate or pleural effusion. No pulmonary edema. Again noted postsurgical changes with metallic fixation rods and transpedicular screws lumbar spine. IMPRESSION: No active disease. Electronically Signed   By: Lahoma Crocker M.D.   On: 01/04/2016 16:38   Dg Fluoro Guide Lumbar Puncture  Result Date: 01/05/2016 CLINICAL DATA:  CT tonic patient. Headaches and shortness of breath. EXAM: DIAGNOSTIC LUMBAR PUNCTURE UNDER FLUOROSCOPIC GUIDANCE FLUOROSCOPY TIME:  Radiation Exposure Index (as provided by the fluoroscopic device): Dose area product equals 55.58 mcGy meter squared If the device does not provide the exposure index: Fluoroscopy Time (in minutes and seconds):  0 minutes 24 seconds Number of Acquired Images:  1 last image hold PROCEDURE: Informed consent was obtained from the patient prior to the procedure, including potential complications of headache, allergy, and pain. With the patient prone, the lower back was prepped with Betadine. 1% Lidocaine was used for local anesthesia. Lumbar puncture was performed at  the L4-5 level using a 20 gauge needle with return of clear CSF with an opening pressure of 8 cm water. 8 ml of CSF were obtained for laboratory studies. The patient tolerated the procedure well and there were no apparent complications. IMPRESSION: Uncomplicated lumbar puncture performed at the L4-5 level under fluoroscopic guidance. Electronically Signed   By: Lucienne Capers M.D.   On: 01/05/2016 02:34    Lab Data:  CBC:  Recent Labs Lab 01/04/16 1600 01/04/16 1813 01/05/16 0916 01/06/16 0250  WBC 18.6*  --  23.0* 18.6*  NEUTROABS 15.3*  --   --   --   HGB 13.6 13.6 11.4* 11.0*  HCT 39.9 40.0 33.9* 33.9*  MCV 86.0  --  86.5 86.5  PLT 275  --  253 [id number]   Basic Metabolic Panel:  Recent Labs Lab 01/04/16 1600 01/04/16 1813 01/05/16 0916 01/06/16 0250  NA 133* 136 136 137  K 3.0* 3.1* 3.4* 3.1*  CL 92* 93* 101 102  CO2 22  --  25 26  GLUCOSE 180* 186* 219* 172*  BUN 21* 21* 17 15  CREATININE 1.10* 0.80 0.84 0.89  CALCIUM 10.4*  --  9.2 9.1   GFR: Estimated Creatinine Clearance: 39.2 mL/min (by C-G formula based on SCr of 0.89 mg/dL). Liver Function Tests:  Recent Labs Lab 01/04/16 1600 01/05/16 0916  AST 32 24  ALT 16 14  ALKPHOS 37* 32*  BILITOT 1.2 0.6  PROT 7.9 6.4*  ALBUMIN 4.0 3.2*    Recent Labs Lab 01/04/16 1600  LIPASE 27    Recent Labs Lab 01/04/16 1600 01/05/16 0916  AMMONIA 26 22   Coagulation Profile:  Recent Labs Lab 01/04/16 1600  INR 1.02   Cardiac Enzymes:  Recent Labs Lab 01/04/16 1757 01/04/16 2249 01/05/16 0309 01/05/16 0916 01/05/16 1308 01/05/16 1800  CKTOTAL 60  --   --   --   --   --  TROPONINI  --  0.15* 0.16* 0.19*  0.18* 0.22* 0.24*   BNP (last 3 results) No results for input(s): PROBNP in the last 8760 hours. HbA1C:  Recent Labs  01/05/16 0309  HGBA1C 6.3*   CBG:  Recent Labs Lab 01/04/16 2031 01/04/16 2233  GLUCAP 182* 188*   Lipid Profile:  Recent Labs  01/05/16 0309  CHOL 133  HDL  82  LDLCALC 40  TRIG 55  CHOLHDL 1.6   Thyroid Function Tests:  Recent Labs  01/04/16 2005  TSH 0.765   Anemia Panel:  Recent Labs  01/05/16 0916  VITAMINB12 1,295*  FOLATE 46.4   Urine analysis:    Component Value Date/Time   COLORURINE YELLOW 01/04/2016 1740   APPEARANCEUR CLEAR 01/04/2016 1740   LABSPEC 1.020 01/04/2016 1740   PHURINE 6.5 01/04/2016 1740   GLUCOSEU NEGATIVE 01/04/2016 1740   HGBUR NEGATIVE 01/04/2016 1740   BILIRUBINUR NEGATIVE 01/04/2016 1740   KETONESUR 15 (A) 01/04/2016 1740   PROTEINUR >300 (A) 01/04/2016 1740   UROBILINOGEN 0.2 03/20/2013 1403   NITRITE NEGATIVE 01/04/2016 1740   LEUKOCYTESUR NEGATIVE 01/04/2016 1740     Othel Dicostanzo M.D. [facility] 01/06/2016, 11:44 AM  Pager: [phone] Between 7am to 7pm - call Pager - [phone]  After 7pm go to [email or web address] - password [email or web address]  Call night coverage person covering after 7pm

## 2016-01-06 NOTE — Consult Note (Signed)
CARDIOLOGY CONSULT NOTE   Patient ID: Tamara Arnold MRN: [id number] DOB/AGE: [birth year] 82 y.o.  Admit date: 01/04/2016  Requesting Physician: Dr. Tana Coast Primary Physician:   Stephens Shire, MD Primary Cardiologist:   New Reason for Consultation:   Elevated troponin  HPI: Tamara Arnold is a 82 y.o. female with a history of HTN, chronic headaches, delusional disorder, paranoia/psychiatric illness, dementia and no previous cardiac history who was admitted to [facility] on 01/04/16 for unresponsiveness. Family brought her in a catatonic state per H&P. Now alert and oriented. Troponin noted to be mildly elevated and flat and cardiology consulted.  The patient lives at home alone but has a family caregiver a few mornings a week. The caregiver went over on 01/04/16 and the patient was sitting in a chair and appeared unable to talk at all.  Patient "came out of it" but was pointing to her head (40 years of severe headaches).  The caregiver tried to get her to eat and take Tramadol - none since previous night.  Has been taking Tramadol for several months with no prior problem. She was not talking, looked glazed and staring off into the distance per H&P.  CT head, MRI negative for any CVA. UA negative for UTI, LP done, studies negative for any meningitis. Chest x-ray negative for any pneumonia but she was started on empiric IV vancomycin and Zosyn. EEG consistent with possible mild encephalopathy, no seizure. Labs did show elevated lactic acidosis as well as leukocytosis. There was unclear etiology of  underlying infectious source. They did do CT of the abdomen today for evaluation of N/V. She's been continued on IV fluids.   The patient is currently alert and oriented and no longer catatonic. However she is difficult to get history from and she rambles a lot about unrelated topics. She denies any chest pain but does say that she has some shortness of breath. When questioned further about  that she could not tell me more and just discussed the x-ray she had this morning and talked about conversations with the [place]. She has no previous cardiac history. No lower extremity edema, orthopnea or PND. No dizziness or syncope.     Past Medical History:  Diagnosis Date  . Anemia   . Anxiety   . Arthritis    "neck, back; none in my knees" (03/20/2013)  . Chronic lower back pain   . Daily headache    "all the time for 37 yrs" (03/20/2013)  . Depression   . GERD (gastroesophageal reflux disease)   . History of blood transfusion 02/2013   "related to this recent back OR" (03/20/2013)  . Hyperlipidemia   . Hypertension   . Macular degeneration of both eyes   . Migraine    "not migraines/neurologist" (09/12/2012)  . Shortness of breath    "lately cause my stomach is swollen" (03/20/2013)  . Skin cancer    "on my face alot; had one cut off left leg before; (03/20/2013)  . Type II diabetes mellitus ([place])    off all meds now     Past Surgical History:  Procedure Laterality Date  . ABDOMINAL HYSTERECTOMY    . APPENDECTOMY  1934  . BACK SURGERY  03/10/2013   [facility]-spinal stenosis  . CARPAL TUNNEL RELEASE Right 12/03/2013   Procedure: RIGHT CARPAL TUNNEL RELEASE;  Surgeon: Wynonia Sours, MD;  Location: [place];  Service: Orthopedics;  Laterality: Right;  . CATARACT EXTRACTION W/ INTRAOCULAR LENS  IMPLANT, BILATERAL Bilateral   . CHOLECYSTECTOMY  1970's?  . CYSTECTOMY     "top of my head; removed @ [facility]; it was benign" (09/12/2012)  . HERNIA REPAIR    . LUMBAR PUNCTURE  01/05/2016   Archie Endo 01/05/2016  . SKIN CANCER EXCISION     LLE  . TRIGGER FINGER RELEASE Right 12/03/2013   Procedure: RELEASE TRIGGER FINGER/A-1 PULLEY SMALL FINGER;  Surgeon: Wynonia Sours, MD;  Location: [place];  Service: Orthopedics;  Laterality: Right;  . UMBILICAL HERNIA REPAIR      Allergies  Allergen Reactions  . Gabapentin Other (See Comments)    headaches  .  Prednisone Other (See Comments)    Headaches-all steroids  . Topiramate Other (See Comments)    Makes headaches worse  . Wellbutrin [Bupropion Hcl] Other (See Comments)    Caused headaches    I have reviewed the patient's current medications . amLODipine  5 mg Oral Daily  . aspirin  300 mg Rectal Daily   Or  . aspirin  325 mg Oral Daily  . enoxaparin (LOVENOX) injection  40 mg Subcutaneous QHS  . hydrALAZINE  50 mg Oral QID  . lisinopril  40 mg Oral Daily  . metoprolol  2.5 mg Intravenous Q6H  . multivitamin  1 tablet Oral Daily  . piperacillin-tazobactam (ZOSYN)  IV  3.375 g Intravenous Q8H  . polyethylene glycol  34 g Oral Daily  . vancomycin  1,000 mg Intravenous Q24H   . sodium chloride 50 mL/hr at 01/06/16 0710   acetaminophen **OR** acetaminophen, bisacodyl, hydrALAZINE, labetalol, LORazepam, morphine injection, ondansetron (ZOFRAN) IV  Prior to Admission medications   Medication Sig Start Date End Date Taking? Authorizing Provider  acetaminophen (TYLENOL) 325 MG tablet Take 650 mg by mouth 3 (three) times daily as needed for mild pain or headache.    Yes Historical Provider, MD  amitriptyline (ELAVIL) 25 MG tablet Take 50 mg by mouth at bedtime. 12/29/15  Yes Historical Provider, MD  amLODipine (NORVASC) 5 MG tablet Take 5 mg by mouth daily.    Yes Historical Provider, MD  ascorbic acid (VITAMIN C) 500 MG tablet Take 500 mg by mouth every evening.    Yes Historical Provider, MD  beta carotene w/minerals (OCUVITE) tablet Take 1 tablet by mouth daily.   Yes Historical Provider, MD  Calcium Carb-Cholecalciferol (CALCIUM 600+D) 600-800 MG-UNIT TABS Take 1 tablet by mouth every evening.   Yes Historical Provider, MD  clonazePAM (KLONOPIN) 1 MG tablet Take 1 tablet (1 mg total) by mouth 2 (two) times daily as needed for anxiety. For anxiety Patient taking differently: Take 1 mg by mouth at bedtime. For anxiety 03/27/13  Yes Kelvin Cellar, MD  fenofibrate 160 MG tablet Take 160 mg  by mouth daily.   Yes Historical Provider, MD  ferrous sulfate 325 (65 FE) MG tablet Take 325 mg by mouth daily at 12 noon.   Yes Historical Provider, MD  fluticasone (FLONASE) 50 MCG/ACT nasal spray Place 2 sprays into the nose daily as needed for allergies (Seasonal).    Yes Historical Provider, MD  folic acid (FOLVITE) 1 MG tablet Take 1 mg by mouth 2 (two) times daily.     Yes Historical Provider, MD  hydrALAZINE (APRESOLINE) 50 MG tablet Take 50 mg by mouth 4 (four) times daily.    Yes Historical Provider, MD  hydrochlorothiazide (HYDRODIURIL) 25 MG tablet Take 25 mg by mouth every evening. 12/27/15  Yes Historical Provider, MD  lisinopril (PRINIVIL,ZESTRIL) 40  MG tablet Take 40 mg by mouth daily.   Yes Historical Provider, MD  Multiple Vitamins-Minerals (MULTIVITAMINS THER. W/MINERALS) TABS Take 1 tablet by mouth daily.     Yes Historical Provider, MD  omeprazole (PRILOSEC) 20 MG capsule Take 20 mg by mouth 2 (two) times daily before a meal.    Yes Historical Provider, MD  polyethylene glycol (MIRALAX / GLYCOLAX) packet Take 34 g by mouth daily.    Yes Historical Provider, MD  propranolol (INDERAL) 60 MG tablet Take 120 mg by mouth at bedtime.    Yes Historical Provider, MD  traMADol (ULTRAM) 50 MG tablet Take 50 mg by mouth 3 (three) times daily as needed for moderate pain.  12/15/15  Yes Historical Provider, MD  vitamin E (VITAMIN E) 400 UNIT capsule Take 400 Units by mouth daily.     Yes Historical Provider, MD     Social History   Social History  . Marital status: Widowed    Spouse name: N/A  . Number of children: 4  . Years of education: 63   Occupational History  . retired    Social History Main Topics  . Smoking status: Never Smoker  . Smokeless tobacco: Never Used  . Alcohol use No  . Drug use: No  . Sexual activity: No   Other Topics Concern  . Not on file   Social History Narrative   Lives alone   No caffeine use    Family Status  Relation Status  . Father  Deceased at age 69   heart failure  . Mother Deceased at age 61   Family History  Problem Relation Age of Onset  . Diabetes type II Father     ROS:  Full 14 point review of systems complete and found to be negative unless listed above.  Physical Exam: Blood pressure (!) 172/65, pulse 99, temperature 99.8 F (37.7 C), temperature source Oral, resp. rate 18, height 5\' 3"  (1.6 m), weight 140 lb (63.5 kg), SpO2 100 %.  General: Well developed, well nourished, female in no acute distress, odd affect Head: Eyes PERRLA, No xanthomas.   Normocephalic and atraumatic, oropharynx without edema or exudate.  Lungs: CTAB Heart: HRRR S1 S2, no rub/gallop, Heart irregular rate and rhythm with S1, S2  murmur. pulses are 2+ extrem.   Neck: No carotid bruits. No lymphadenopathy. No JVD. Abdomen: Bowel sounds present, abdomen soft and non-tender without masses or hernias noted. Msk:  No spine or cva tenderness. No weakness, no joint deformities or effusions. Extremities: No clubbing or cyanosis. No LE edema.  Neuro: Alert and oriented X 3. No focal deficits noted. Psych:  Good affect, responds appropriately Skin: No rashes or lesions noted.  Labs:   Lab Results  Component Value Date   WBC 18.6 (H) 01/06/2016   HGB 11.0 (L) 01/06/2016   HCT 33.9 (L) 01/06/2016   MCV 86.5 01/06/2016   PLT 282 01/06/2016    Recent Labs  01/04/16 1600  INR 1.02    Recent Labs Lab 01/05/16 0916 01/06/16 0250  NA 136 137  K 3.4* 3.1*  CL 101 102  CO2 25 26  BUN 17 15  CREATININE 0.84 0.89  CALCIUM 9.2 9.1  PROT 6.4*  --   BILITOT 0.6  --   ALKPHOS 32*  --   ALT 14  --   AST 24  --   GLUCOSE 219* 172*  ALBUMIN 3.2*  --    Magnesium  Date Value Ref Range Status  03/21/2013 1.1 (L) 1.5 - 2.5 mg/dL Final    Recent Labs  01/04/16 1757  01/05/16 0309 01/05/16 0916 01/05/16 1308 01/05/16 1800  CKTOTAL 60  --   --   --   --   --   TROPONINI  --   < > 0.16* 0.19*  0.18* 0.22* 0.24*  < > =  values in this interval not displayed.  Recent Labs  01/04/16 1811  TROPIPOC 0.16*   Pro B Natriuretic peptide (BNP)  Date/Time Value Ref Range Status  03/24/2013 01:35 AM 14,726.0 (H) 0 - 450 pg/mL Final  01/23/2012 06:43 AM 2,925.0 (H) 0 - 450 pg/mL Final   Lab Results  Component Value Date   CHOL 133 01/05/2016   HDL 82 01/05/2016   LDLCALC 40 01/05/2016   TRIG 55 01/05/2016   No results found for: DDIMER Lipase  Date/Time Value Ref Range Status  01/04/2016 04:00 PM 27 11 - 51 U/L Final   TSH  Date/Time Value Ref Range Status  01/04/2016 08:05 PM 0.765 0.350 - 4.500 uIU/mL Final  03/20/2013 08:55 PM 0.985 0.350 - 4.500 uIU/mL Final    Comment:    Performed at [facility]   Vitamin B-12  Date/Time Value Ref Range Status  01/05/2016 09:16 AM 1,295 (H) 180 - 914 pg/mL Final    Comment:    (NOTE) This assay is not validated for testing neonatal or myeloproliferative syndrome specimens for Vitamin B12 levels.    Folate  Date/Time Value Ref Range Status  01/05/2016 09:16 AM 46.4 >5.9 ng/mL Final    Comment:    RESULTS CONFIRMED BY MANUAL DILUTION   Ferritin  Date/Time Value Ref Range Status  11/11/2010 09:03 AM 181 10 - 291 ng/mL Final   TIBC  Date/Time Value Ref Range Status  11/11/2010 09:03 AM 284 250 - 470 ug/dL Final   Iron  Date/Time Value Ref Range Status  11/11/2010 09:03 AM 54 42 - 135 ug/dL Final    Echo: pending  ECG:  Probable MAT with HR 113 with LVH, PVCs and flat ST depression in inferior leads as well as V3-V6  Radiology:  Ct Head Wo Contrast  Result Date: 01/04/2016 CLINICAL DATA:  Altered mental status. Generalized weakness over the last 1-2 weeks. EXAM: CT HEAD WITHOUT CONTRAST TECHNIQUE: Contiguous axial images were obtained from the base of the skull through the vertex without intravenous contrast. COMPARISON:  CT head without contrast 05/27/2015. FINDINGS: Brain: A tiny arachnoid cyst is again noted near the vertex on the  right. No acute infarct, hemorrhage, or mass lesion is present. Minimal atrophy and white matter disease is within normal limits for age. The basal ganglia and insular ribbon are normal. No focal cortical lesions are present. Vascular: Atherosclerotic calcifications are present within the cavernous internal carotid arteries and at the dural margin of the vertebral arteries bilaterally. Skull: The calvarium is intact. Sinuses/Orbits: A polyp or mucous retention cyst within the right sphenoid sinus is stable. The remaining paranasal sinuses and the mastoid air cells are clear. The patient is status post bilateral lens replacements. The globes and orbits are otherwise intact. Other: No significant extracranial soft tissue lesion is present. IMPRESSION: 1. No acute intracranial abnormality or significant interval change. 2. Atherosclerosis. Electronically Signed   By: San Morelle M.D.   On: 01/04/2016 16:57   Mr Brain W Or Wo Contrast  Result Date: 01/04/2016 CLINICAL DATA:  Last seen normal at 1800 hours yesterday. Headache, altered mental status. Generalized weakness for 1-2  weeks. History of hypertension, hyperlipidemia, migraines, diabetes. EXAM: MRI HEAD WITHOUT AND WITH CONTRAST TECHNIQUE: Multiplanar, multiecho pulse sequences of the brain and surrounding structures were obtained without and with intravenous contrast. CONTRAST:  67mL MULTIHANCE GADOBENATE DIMEGLUMINE 529 MG/ML IV SOLN COMPARISON:  CT HEAD January 04, 2016 at 1641 hours and MRI head November 29, 2011 and CT HEAD February 14, 2010 FINDINGS: INTRACRANIAL CONTENTS: No reduced diffusion to suggest acute ischemia. No susceptibility artifact to suggest hemorrhage. The ventricles and sulci are normal for patient's age. Minimal white matter changes compatible chronic small vessel ischemic disease, less than expected for age. No suspicious parenchymal signal, mass effect. Stable 11 x 13 x 13 mm homogeneously bright T2 RIGHT parietal cortical based  lesion completely suppresses on FLAIR sequence, without enhancement or solid component. No abnormal intraparenchymal or extra-axial enhancement. No abnormal extra-axial fluid collections. Resolution of RIGHT parietal/parafalcine fluid collection with very minimal hemosiderin staining. No extra-axial masses. Normal major intracranial vascular flow voids present at skull base. ORBITS: The included ocular globes and orbital contents are non-suspicious. Status post bilateral ocular lens implants. SINUSES: Small LEFT sphenoid mucosal retention cyst. Trace RIGHT mastoid effusion. SKULL/SOFT TISSUES: No abnormal sellar expansion. No suspicious calvarial bone marrow signal. Craniocervical junction maintained. IMPRESSION: No acute intracranial process. Slightly larger 11 x 11 x 13 cyst RIGHT parietal benign-appearing cyst, possibly neural glial though, recommend close attention on follow-up imaging. Resolution of RIGHT parietal extra-axial fluid collection, which was most compatible with blood products. Electronically Signed   By: Elon Alas M.D.   On: 01/04/2016 22:14   Ct Abdomen Pelvis W Contrast  Result Date: 01/06/2016 CLINICAL DATA:  Abdominal pain EXAM: CT ABDOMEN AND PELVIS WITH CONTRAST TECHNIQUE: Multidetector CT imaging of the abdomen and pelvis was performed using the standard protocol following bolus administration of intravenous contrast. CONTRAST:  7mL ISOVUE-300 IOPAMIDOL (ISOVUE-300) INJECTION 61% COMPARISON:  CT abdomen pelvis 03/20/2013 FINDINGS: Lower chest: Tiny pleural effusions bilaterally. Right middle lobe infiltrate, possible pneumonia. Mild left lower lobe atelectasis/ infiltrate. Hepatobiliary: Postop cholecystectomy. Small cyst in the left lobe of the liver unchanged. No biliary dilatation. Pancreas: Pancreatic atrophy.  No mass or edema. Spleen: Negative Adrenals/Urinary Tract: No renal mass or obstruction. No urinary tract stone. Urinary bladder empty with a Foley catheter in place.  Stomach/Bowel: Negative for bowel obstruction. Constipation with extensive stool throughout the colon. Appendectomy. No bowel edema. Vascular/Lymphatic: Atherosclerotic calcification throughout the abdominal aorta and its branches. No aneurysm. No adenopathy. Reproductive: Hysterectomy.  No pelvic mass. Other: Ventral hernia repair with mesh. 2 x 3 cm hernia along the lower margin of the mesh is unchanged from the prior study. Bowel loops extend into the hernia. No significant edema or fluid in the hernia sac. Musculoskeletal: Pedicle screw fusion T12 through L3. Multilevel degenerative change. Negative for fracture. No acute skeletal abnormality. IMPRESSION: Right middle lobe infiltrate, possible pneumonia. Chest x-ray recommended. Tiny pleural effusions bilaterally Constipation with extensive stool throughout the colon. Negative for bowel obstruction Ventral hernia recurrence the lower margin of the mesh unchanged from prior CT. Bowel loops extend into the hernia without edema or fluid in the hernia sac. Electronically Signed   By: Franchot Gallo M.D.   On: 01/06/2016 11:56   Dg Chest Port 1 View  Result Date: 01/04/2016 CLINICAL DATA:  Generalized weakness for the last 2 weeks EXAM: PORTABLE CHEST 1 VIEW COMPARISON:  03/24/2013 FINDINGS: Cardiomediastinal silhouette is stable. No acute infiltrate or pleural effusion. No pulmonary edema. Again noted postsurgical changes with metallic fixation  rods and transpedicular screws lumbar spine. IMPRESSION: No active disease. Electronically Signed   By: Lahoma Crocker M.D.   On: 01/04/2016 16:38   Dg Fluoro Guide Lumbar Puncture  Result Date: 01/05/2016 CLINICAL DATA:  CT tonic patient. Headaches and shortness of breath. EXAM: DIAGNOSTIC LUMBAR PUNCTURE UNDER FLUOROSCOPIC GUIDANCE FLUOROSCOPY TIME:  Radiation Exposure Index (as provided by the fluoroscopic device): Dose area product equals 55.58 mcGy meter squared If the device does not provide the exposure index:  Fluoroscopy Time (in minutes and seconds):  0 minutes 24 seconds Number of Acquired Images:  1 last image hold PROCEDURE: Informed consent was obtained from the patient prior to the procedure, including potential complications of headache, allergy, and pain. With the patient prone, the lower back was prepped with Betadine. 1% Lidocaine was used for local anesthesia. Lumbar puncture was performed at the L4-5 level using a 20 gauge needle with return of clear CSF with an opening pressure of 8 cm water. 8 ml of CSF were obtained for laboratory studies. The patient tolerated the procedure well and there were no apparent complications. IMPRESSION: Uncomplicated lumbar puncture performed at the L4-5 level under fluoroscopic guidance. Electronically Signed   By: Lucienne Capers M.D.   On: 01/05/2016 02:34    ASSESSMENT AND PLAN:    Principal Problem:   Catatonia ([place]) Active Problems:   Headache   H/O major depression   Hyperlipidemia   Hypertension   Constipation   Altered mental status  Probable MAT: will stop her amlodipine 5mg  daily and start diltiazem CD 120mg  for better rate control. 2D ECHO pending. This will likely need to be uptitrated for further heart rate and blood pressure control.  Elevated troponin: low and flat c/w demand ischemia in the setting of sepsis physiology. She has not complained of any chest pain, but has had some shortness of breath. She is not able to qualify this any further for me. Her heart rate is noted to the elevated and looks like MAT with heart rates ranging from 100-140 bpm. She does have some ST depression in her inferior leads as well as V3-V6. 2-D echo is pending. Will wait for results of that before deciding on any further ischemic workup, if any.  HTN: BP has been elevated on hydralazine 50 mg 4 times a day, lisinopril 40 mg daily, IV Lopressor 2.5 mg 4 times a day. As above I have stopped her home amlodipine 5 mg and started diltiazem 120 mg daily. This will  likely need to be uptitrated for further heart rate and blood pressure control.  Lactic acidosis, leukocytosis: Unclear etiology of any underlying infectious source, obtain CT abdomen as patient continues to complain of abdominal pain and nausea - Continue IV fluid hydration and IV antibiotics per IM. Blood cultures negative so far, CSF cultures negative  Headache with h/o paranoid/delusional thinking: Patient was last seen by neurology (Dr. Jannifer Franklin) in March 2017. She was showing evidence of paranoid/delusional thinking at that time and also had a score of 22/30 on the MMSE suggesting mild dementia.   Signed: Angelena Form, PA-C 01/06/2016 12:11 PM  Pager [phone]  Co-Sign MD  Patient seen and examined. Agree with assessment and plan.  Ms. Sela Ruzzo is an 82 year old female  who was admitted with altered mental status.  He has a history of hypertension,  depression, paranoid delusional thinking, and was admitted with unresponsiveness and catatonia.  She has had recurrent tachy dysrhythmias and was hypertensive.  He had been on lisinopril and amlodipine.  Her heart rate now is sinus tachycardia at 130 - 140 bpm with frequent PACs.  The patient had been receiving hydralazine 50 mg 2 so far  earlier today for blood pressure elevation had received an oral dose of Cardizem CD 120 mg also received lisinopril 40 mg for hypertension.  I would recommend discontinuance of amlodipine and substitute Cardizem for its negative chronotropic benefit.  I have just given her Lopressor 2.5 mg iv .  Heart rate has responded to approximately 110 bpm in sinus tachycardia with reduced ectopy.  We will initiate Lopressor 25 g every 8 hours initially and plan to titrate to 50 mg twice a day as blood pressure allows with plan to supplement with IV 2.5 mg PRN heart rate > 120.   At this point, I would discontinue hydralazine.  Troponins are mildly positive with a flat curve suggestive of demand ischemia.  I would  recommend a 2-D echo Doppler study to evaluate systolic and diastolic function and hypertensive cardiovascular disease.  Laboratory reveals a potassium of 3.1.  BNP is 306.  TSH 0.765.  Recommend KCL supplementation to 4 and check magnesium level.  Will follow with you.    Troy Sine, MD, [facility] 01/06/2016 5:38 PM

## 2016-01-06 NOTE — Progress Notes (Signed)
Speech Language Pathology Treatment: Dysphagia  Patient Details Name: SAMANTA SUMIDA MRN: [id number] DOB: 10-17-1925 Today's Date: 01/06/2016 Time: [id number] SLP Time Calculation (min) (ACUTE ONLY): 10 min  Assessment / Plan / Recommendation Clinical Impression  Pt with improved spontaneity, communication, clarity of speech.  Tolerating current full liquid diet.  Declined trials of regular solids.  No s/s of aspiration with purees, thin liquids.  Lungs diminished but clear.  Continue full liquid; crush meds.  SLP to f/u for diet progression.   HPI HPI: 82 y.o.femalewith medical history significant of chronic severe headaches and some paranoia/psychiatric illness in her later years but overall clear mental status despite her age presenting in a catatonic state. MRI negative for acute event.  Dx HTN; followed by Dr. Eugenie Birks (March 2017) with dx mild dementia, paranoid/delusional thinking.   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 Minimum diameter is approximately 8 mm.       SLP Plan  Continue with current plan of care     Recommendations  Diet recommendations:  (full liquids) Liquids provided via: Cup;Straw Medication Administration: Crushed with puree Supervision: Patient able to self feed;Staff to assist with self feeding (as needed) Compensations: Slow rate;Small sips/bites Postural Changes and/or Swallow Maneuvers: Seated upright 90 degrees             Oral Care Recommendations: Oral care BID Follow up Recommendations: None Plan: Continue with current plan of care     GO                [name] 01/06/2016, 3:34 PM

## 2016-01-06 NOTE — Evaluation (Signed)
Physical Therapy Evaluation Patient Details Name: Tamara Arnold MRN: [id number] DOB: [birth year] Today's Date: 01/06/2016   History of Present Illness  82 y.o.femalewith medical history significant of chronic severe headaches and some paranoia/psychiatric illness in her later years but overall clear mental status despite her age presenting in a catatonic state. MRI negative for acute event.  Dx HTN; followed by Dr. Eugenie Birks (March 2017) with dx mild dementia, paranoid/delusional thinking.   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 Minimum diameter is approximately 8 mm.    Clinical Impression  Pt admitted with above diagnosis. Pt currently with functional limitations due to the deficits listed below (see PT Problem List). Pt was able to sit EOB 10 minutes with min guard assist most of time.  Pt feeling nauseated so terminated treatment after sitting EOB.  Will need SNF.  Pt will benefit from skilled PT to increase their independence and safety with mobility to allow discharge to the venue listed below.      Follow Up Recommendations SNF;Supervision/Assistance - 24 hour    Equipment Recommendations  Other (comment) (TBA)    Recommendations for Other Services       Precautions / Restrictions Precautions Precautions: Fall Restrictions Weight Bearing Restrictions: No      Mobility  Bed Mobility Overal bed mobility: Needs Assistance;+2 for physical assistance Bed Mobility: Rolling;Sidelying to Sit Rolling: Min guard Sidelying to sit: Mod assist;+2 for physical assistance       General bed mobility comments: Used bed pad to assist with scooting to EOB.  Needed asssist for LEs and elevation of trunk but feel pt could have done more.    Transfers                    Ambulation/Gait                Stairs            Wheelchair Mobility    Modified Rankin (Stroke Patients Only)        Balance Overall balance assessment: Needs assistance;History of Falls Sitting-balance support: No upper extremity supported;Feet unsupported Sitting balance-Leahy Scale: Fair Sitting balance - Comments: Pt varied in level of assist needed.  Never got feet to floor.  Left knee and hip limitations noted.  Pt sat a total 10 minutes with min guard to mod assist initially until pt obtained balance.  SEe OT note for face washing.  Did a few exercises at EOB.                                      Pertinent Vitals/Pain Pain Assessment: Faces Faces Pain Scale: Hurts even more Pain Location: headache Pain Descriptors / Indicators: Aching Pain Intervention(s): Limited activity within patient's tolerance;Monitored during session;Repositioned  O2 on RA 95%.  Replaced O2 as it was on prior to arrival to room.  HR 120-129 bpm.    Home Living Family/patient expects to be discharged to:: Skilled nursing facility Living Arrangements: Alone Available Help at Discharge: Personal care attendant (4 days a week 11-2) Type of Home: House Home Access: Stairs to enter Entrance Stairs-Rails: None Entrance Stairs-Number of Steps: 1 Home Layout: One level Home Equipment: Bedside commode;Walker - 4 wheels;Cane - single point;Adaptive equipment      Prior Function Level of Independence: Needs assistance   Gait / Transfers  Assistance Needed: pt ambulates with rollator   ADL's / Homemaking Assistance Needed: Does own B/D, CNA helps with homemaking        Hand Dominance   Dominant Hand: Right    Extremity/Trunk Assessment   Upper Extremity Assessment: Defer to OT evaluation           Lower Extremity Assessment: RLE deficits/detail;LLE deficits/detail RLE Deficits / Details: grossly 3-/5 LLE Deficits / Details: grossly 2+/5 with hip limitations possibly but pt would not allow full testing.   Cervical / Trunk Assessment: Kyphotic  Communication   Communication: Other (comment)  (speaks softly)  Cognition Arousal/Alertness: Awake/alert Behavior During Therapy: Flat affect;Anxious Overall Cognitive Status: History of cognitive impairments - at baseline                      General Comments General comments (skin integrity, edema, etc.): Pt refused to stand saying she was feeling nauseated just sitting EOB.  Assisted back to bed after 10 min sitting EOB.     Exercises General Exercises - Lower Extremity Long Arc Quad: AROM;Both;5 reps;Seated      Assessment/Plan    PT Assessment Patient needs continued PT services  PT Diagnosis Generalized weakness;Acute pain   PT Problem List Decreased activity tolerance;Decreased balance;Decreased mobility;Decreased knowledge of use of DME;Decreased safety awareness;Decreased knowledge of precautions;Decreased strength;Pain  PT Treatment Interventions DME instruction;Gait training;Functional mobility training;Therapeutic activities;Therapeutic exercise;Balance training;Patient/family education   PT Goals (Current goals can be found in the Care Plan section) Acute Rehab PT Goals Patient Stated Goal: go somewhere where I can get help PT Goal Formulation: With patient Time For Goal Achievement: 01/20/16 Potential to Achieve Goals: Good    Frequency Min 2X/week   Barriers to discharge Decreased caregiver support      Co-evaluation PT/OT/SLP Co-Evaluation/Treatment: Yes Reason for Co-Treatment: For patient/therapist safety PT goals addressed during session: Mobility/safety with mobility         End of Session Equipment Utilized During Treatment: Gait belt;Oxygen Activity Tolerance: Patient limited by fatigue;Patient limited by pain Patient left: in bed;with call bell/phone within reach;with bed alarm set Nurse Communication: Mobility status         Time: [id number] PT Time Calculation (min) (ACUTE ONLY): 26 min   Charges:   PT Evaluation $PT Eval Moderate Complexity: 1 Procedure     PT G CodesIrwin Brakeman F 01/23/2016, 9:58 AM [facility] [phone] (pager)

## 2016-01-07 ENCOUNTER — Inpatient Hospital Stay (HOSPITAL_COMMUNITY): Payer: PPO

## 2016-01-07 DIAGNOSIS — I248 Other forms of acute ischemic heart disease: Secondary | ICD-10-CM | POA: Diagnosis not present

## 2016-01-07 DIAGNOSIS — K5901 Slow transit constipation: Secondary | ICD-10-CM

## 2016-01-07 DIAGNOSIS — Z8659 Personal history of other mental and behavioral disorders: Secondary | ICD-10-CM | POA: Diagnosis not present

## 2016-01-07 DIAGNOSIS — E871 Hypo-osmolality and hyponatremia: Secondary | ICD-10-CM | POA: Diagnosis not present

## 2016-01-07 DIAGNOSIS — E872 Acidosis: Secondary | ICD-10-CM | POA: Diagnosis not present

## 2016-01-07 DIAGNOSIS — J189 Pneumonia, unspecified organism: Secondary | ICD-10-CM | POA: Diagnosis not present

## 2016-01-07 DIAGNOSIS — G43909 Migraine, unspecified, not intractable, without status migrainosus: Secondary | ICD-10-CM | POA: Diagnosis not present

## 2016-01-07 DIAGNOSIS — R000 Tachycardia, unspecified: Secondary | ICD-10-CM | POA: Diagnosis not present

## 2016-01-07 DIAGNOSIS — F061 Catatonic disorder due to known physiological condition: Secondary | ICD-10-CM | POA: Diagnosis not present

## 2016-01-07 DIAGNOSIS — F0390 Unspecified dementia without behavioral disturbance: Secondary | ICD-10-CM | POA: Diagnosis not present

## 2016-01-07 DIAGNOSIS — E1165 Type 2 diabetes mellitus with hyperglycemia: Secondary | ICD-10-CM | POA: Diagnosis not present

## 2016-01-07 DIAGNOSIS — R51 Headache: Secondary | ICD-10-CM | POA: Diagnosis not present

## 2016-01-07 DIAGNOSIS — I471 Supraventricular tachycardia: Secondary | ICD-10-CM | POA: Diagnosis not present

## 2016-01-07 DIAGNOSIS — I10 Essential (primary) hypertension: Secondary | ICD-10-CM | POA: Diagnosis not present

## 2016-01-07 DIAGNOSIS — G9340 Encephalopathy, unspecified: Secondary | ICD-10-CM | POA: Diagnosis not present

## 2016-01-07 DIAGNOSIS — E785 Hyperlipidemia, unspecified: Secondary | ICD-10-CM | POA: Diagnosis not present

## 2016-01-07 DIAGNOSIS — R079 Chest pain, unspecified: Secondary | ICD-10-CM | POA: Diagnosis not present

## 2016-01-07 DIAGNOSIS — E876 Hypokalemia: Secondary | ICD-10-CM | POA: Diagnosis not present

## 2016-01-07 DIAGNOSIS — Z66 Do not resuscitate: Secondary | ICD-10-CM | POA: Diagnosis not present

## 2016-01-07 DIAGNOSIS — F22 Delusional disorders: Secondary | ICD-10-CM | POA: Diagnosis not present

## 2016-01-07 LAB — CBC
HEMATOCRIT: 32 % — AB (ref 36.0–46.0)
Hemoglobin: 10.5 g/dL — ABNORMAL LOW (ref 12.0–15.0)
MCH: 28.8 pg (ref 26.0–34.0)
MCHC: 32.8 g/dL (ref 30.0–36.0)
MCV: 87.9 fL (ref 78.0–100.0)
Platelets: 275 10*3/uL (ref 150–400)
RBC: 3.64 MIL/uL — ABNORMAL LOW (ref 3.87–5.11)
RDW: 16.7 % — AB (ref 11.5–15.5)
WBC: 18.7 10*3/uL — ABNORMAL HIGH (ref 4.0–10.5)

## 2016-01-07 LAB — GLUCOSE, CAPILLARY: GLUCOSE-CAPILLARY: 146 mg/dL — AB (ref 65–99)

## 2016-01-07 LAB — BASIC METABOLIC PANEL
ANION GAP: 8 (ref 5–15)
BUN: 21 mg/dL — ABNORMAL HIGH (ref 6–20)
CALCIUM: 9.2 mg/dL (ref 8.9–10.3)
CHLORIDE: 102 mmol/L (ref 101–111)
CO2: 28 mmol/L (ref 22–32)
CREATININE: 0.85 mg/dL (ref 0.44–1.00)
GFR calc non Af Amer: 59 mL/min — ABNORMAL LOW (ref >60)
Glucose, Bld: 180 mg/dL — ABNORMAL HIGH (ref 65–99)
Potassium: 3.9 mmol/L (ref 3.5–5.1)
SODIUM: 138 mmol/L (ref 135–145)

## 2016-01-07 LAB — ECHOCARDIOGRAM COMPLETE
Height: 63 in
Weight: 2240 oz

## 2016-01-07 LAB — MAGNESIUM: MAGNESIUM: 1.6 mg/dL — AB (ref 1.7–2.4)

## 2016-01-07 MED ORDER — SENNOSIDES-DOCUSATE SODIUM 8.6-50 MG PO TABS: 1.0000 | ORAL_TABLET | Freq: Every evening | ORAL | Status: DC | PRN

## 2016-01-07 MED ORDER — METOPROLOL TARTRATE 50 MG PO TABS
50.0000 mg | ORAL_TABLET | Freq: Two times a day (BID) | ORAL | Status: DC
  Administered 2016-01-07 – 2016-01-08 (×2): 50 mg via ORAL
  Filled 2016-01-07 (×3): qty 1

## 2016-01-07 MED ORDER — MAGNESIUM SULFATE 50 % IJ SOLN
3.0000 g | Freq: Once | INTRAVENOUS | Status: AC
  Administered 2016-01-07: 3 g via INTRAVENOUS
  Filled 2016-01-07 (×2): qty 6

## 2016-01-07 MED ORDER — DILTIAZEM HCL ER COATED BEADS 180 MG PO CP24
180.0000 mg | ORAL_CAPSULE | Freq: Every day | ORAL | Status: DC
  Administered 2016-01-07 – 2016-01-11 (×5): 180 mg via ORAL
  Filled 2016-01-07 (×6): qty 1

## 2016-01-07 NOTE — Progress Notes (Signed)
Pharmacy Antibiotic Note  Tamara Arnold is a 82 y.o. female admitted on 01/04/2016 with AMS.  She was started on broad spectrum abx for possible PNA as source of infection.  Pharmacy has been consulted for Vancomycin and Zosyn dosing.  Today is day #4 of therapy.  Afeb, WBC 18.7, cx negative so far.  Plan: Continue Vancomycin 1gIV every 24hours. Goal trough 15-20. Zosyn 3.375g IV q8h (4 hour infusion). F/u c/s, renal function, clinical progression, trough at [facility] De-escalate abx or discontinue as patient appears improved??  Height: 5\' 3"  (160 cm) Weight: 140 lb (63.5 kg) IBW/kg (Calculated) : 52.4  Temp (24hrs), Avg:98.1 F (36.7 C), Min:97.7 F (36.5 C), Max:98.4 F (36.9 C)   Recent Labs Lab 01/04/16 1600 01/04/16 1813 01/04/16 2250 01/05/16 0309 01/05/16 0916 01/06/16 0250 01/07/16 0323  WBC 18.6*  --   --   --  23.0* 18.6* 18.7*  CREATININE 1.10* 0.80  --   --  0.84 0.89 0.85  LATICACIDVEN  --  2.08* 2.0* 2.2*  --   --   --     Estimated Creatinine Clearance: 41 mL/min (by C-G formula based on SCr of 0.85 mg/dL).    Allergies  Allergen Reactions  . Gabapentin Other (See Comments)    headaches  . Prednisone Other (See Comments)    Headaches-all steroids  . Topiramate Other (See Comments)    Makes headaches worse  . Wellbutrin [Bupropion Hcl] Other (See Comments)    Caused headaches    Antimicrobials this admission: Vanc 8/15>> Zosyn 8/15>>  Dose adjustments this admission: none  Microbiology results: 8/15BCx: ngtd 8/15UCx: neg F 8/16 CSF: Gstain neg, cx pending 8/16 hsv pcr: pending  Thank you for allowing pharmacy to be a part of this patient's care.  [employer], Pharm.D., BCPS Clinical Pharmacist Pager [phone] 01/07/2016 1:49 PM

## 2016-01-07 NOTE — Progress Notes (Signed)
Alerted of pt HR in 200s' pt given PRN hydralizine. Pt assessed and asymptomatic; pt  HR in the 170-190s. Pt given PRN metroprolol x2. Pt HRs 160s after 2 doses. Cardiology paged Odis Hollingshead about pt condition . He advised to give another 2.5mg  metoprolol in an hour if needed and to give daily dose of cardizem 180 mg 24 hr capsule. Pt Bp has decreased to 100s/ 70s and HR 159-170s. Will continue to monitor patient

## 2016-01-07 NOTE — Progress Notes (Signed)
Subjective:  No chest pain reported; pt is currently sleeping  Objective:   Vital Signs : Vitals:   01/06/16 2119 01/06/16 2354 01/07/16 0228 01/07/16 0501  BP: (!) 179/60 (!) 190/63 (!) 185/59 (!) 187/65  Pulse:   85 84  Resp:    18  Temp:    97.7 F (36.5 C)  TempSrc:    Axillary  SpO2:    100%  Weight:      Height:        Intake/Output from previous day:  Intake/Output Summary (Last 24 hours) at 01/07/16 0911 Last data filed at 01/07/16 0502  Gross per 24 hour  Intake              350 ml  Output             1575 ml  Net            -1225 ml    I/O since admission: -2005  Wt Readings from Last 3 Encounters:  01/04/16 140 lb (63.5 kg)  07/22/15 128 lb (58.1 kg)  12/01/13 142 lb (64.4 kg)    Medications: . aspirin  300 mg Rectal Daily   Or  . aspirin  325 mg Oral Daily  . diltiazem  120 mg Oral Daily  . enoxaparin (LOVENOX) injection  40 mg Subcutaneous QHS  . hydrALAZINE  50 mg Oral QID  . lisinopril  40 mg Oral Daily  . metoprolol  2.5 mg Intravenous Q6H  . metoprolol tartrate  25 mg Oral Q8H  . multivitamin  1 tablet Oral Daily  . piperacillin-tazobactam (ZOSYN)  IV  3.375 g Intravenous Q8H  . polyethylene glycol  34 g Oral Daily  . senna-docusate  1 tablet Oral BID  . vancomycin  1,000 mg Intravenous Q24H       Physical Exam:   General appearance: sleeping Neck: no adenopathy, no JVD, supple, symmetrical, trachea midline and thyroid not enlarged, symmetric, no tenderness/mass/nodules Lungs: clear to auscultation bilaterally Heart: RR in the 80 - 90s with rare ectopic Abdomen: soft, non-tender; bowel sounds normal; no masses,  no organomegaly Extremities: no edema, redness or tenderness in the calves or thighs   Rate: 88  Rhythm: sinus with PACs  ECG (independently read by me): NSR at 81 with PAC  Lab Results:   Recent Labs  01/05/16 0916 01/06/16 0250 01/07/16 0323  NA 136 137 138  K 3.4* 3.1* 3.9  CL 101 102 102  CO2 25 26 28     GLUCOSE 219* 172* 180*  BUN 17 15 21*  CREATININE 0.84 0.89 0.85  CALCIUM 9.2 9.1 9.2    Hepatic Function Latest Ref Rng & Units 01/05/2016 01/04/2016 03/20/2013  Total Protein 6.5 - 8.1 g/dL 6.4(L) 7.9 5.7(L)  Albumin 3.5 - 5.0 g/dL 3.2(L) 4.0 2.4(L)  AST 15 - 41 U/L 24 32 19  ALT 14 - 54 U/L 14 16 13   Alk Phosphatase 38 - 126 U/L 32(L) 37(L) 46  Total Bilirubin 0.3 - 1.2 mg/dL 0.6 1.2 0.4     Recent Labs  01/04/16 1600  01/05/16 0916 01/06/16 0250 01/07/16 0323  WBC 18.6*  --  23.0* 18.6* 18.7*  NEUTROABS 15.3*  --   --   --   --   HGB 13.6  < > 11.4* 11.0* 10.5*  HCT 39.9  < > 33.9* 33.9* 32.0*  MCV 86.0  --  86.5 86.5 87.9  PLT 275  --  253 282 275  < > =  values in this interval not displayed.   Recent Labs  01/05/16 0916 01/05/16 1308 01/05/16 1800  TROPONINI 0.19*  0.18* 0.22* 0.24*    Lab Results  Component Value Date   TSH 0.765 01/04/2016    Recent Labs  01/05/16 0309  HGBA1C 6.3*     Recent Labs  01/04/16 1600 01/05/16 0916  PROT 7.9 6.4*  ALBUMIN 4.0 3.2*  AST 32 24  ALT 16 14  ALKPHOS 37* 32*  BILITOT 1.2 0.6    Recent Labs  01/04/16 1600  INR 1.02   BNP (last 3 results)  Recent Labs  01/04/16 1600  BNP 306.0*    ProBNP (last 3 results) No results for input(s): PROBNP in the last 8760 hours.   Lipid Panel     Component Value Date/Time   CHOL 133 01/05/2016 0309   TRIG 55 01/05/2016 0309   HDL 82 01/05/2016 0309   CHOLHDL 1.6 01/05/2016 0309   VLDL 11 01/05/2016 0309   LDLCALC 40 01/05/2016 0309      Imaging:  Ct Abdomen Pelvis W Contrast  Result Date: 01/06/2016 CLINICAL DATA:  Abdominal pain EXAM: CT ABDOMEN AND PELVIS WITH CONTRAST TECHNIQUE: Multidetector CT imaging of the abdomen and pelvis was performed using the standard protocol following bolus administration of intravenous contrast. CONTRAST:  53m ISOVUE-300 IOPAMIDOL (ISOVUE-300) INJECTION 61% COMPARISON:  CT abdomen pelvis 03/20/2013 FINDINGS:  Lower chest: Tiny pleural effusions bilaterally. Right middle lobe infiltrate, possible pneumonia. Mild left lower lobe atelectasis/ infiltrate. Hepatobiliary: Postop cholecystectomy. Small cyst in the left lobe of the liver unchanged. No biliary dilatation. Pancreas: Pancreatic atrophy.  No mass or edema. Spleen: Negative Adrenals/Urinary Tract: No renal mass or obstruction. No urinary tract stone. Urinary bladder empty with a Foley catheter in place. Stomach/Bowel: Negative for bowel obstruction. Constipation with extensive stool throughout the colon. Appendectomy. No bowel edema. Vascular/Lymphatic: Atherosclerotic calcification throughout the abdominal aorta and its branches. No aneurysm. No adenopathy. Reproductive: Hysterectomy.  No pelvic mass. Other: Ventral hernia repair with mesh. 2 x 3 cm hernia along the lower margin of the mesh is unchanged from the prior study. Bowel loops extend into the hernia. No significant edema or fluid in the hernia sac. Musculoskeletal: Pedicle screw fusion T12 through L3. Multilevel degenerative change. Negative for fracture. No acute skeletal abnormality. IMPRESSION: Right middle lobe infiltrate, possible pneumonia. Chest x-ray recommended. Tiny pleural effusions bilaterally Constipation with extensive stool throughout the colon. Negative for bowel obstruction Ventral hernia recurrence the lower margin of the mesh unchanged from prior CT. Bowel loops extend into the hernia without edema or fluid in the hernia sac. Electronically Signed   By: CFranchot GalloM.D.   On: 01/06/2016 11:56      Assessment/Plan:   Principal Problem:   [place] Problems:   Headache   H/O major depression   Hyperlipidemia   Essential hypertension   Constipation   Altered mental status   Tachycardia  1. Tachycardia improved with initiation of metoprolol. ? Bursts of MAT yesterday vs sinus tach with PACs.  Will increase metoprolol to 50 mg bid.  2. Elevated troponins:  probable demand ischemia.  Echo not yet done.   3. HTN  Still elevated.  Will increase cardizem to 180 mg and metoprolol to 50 mg bid, also on lisinopril and hydralazine.   4. Lekocytosis  WBC 23 --> 18.7 on zosyn and vanc.  5. Psychiatric disorder    TTroy Sine MD, FEndoscopy Associates Of Valley Forge8/18/2017, 9:11 AM

## 2016-01-07 NOTE — Progress Notes (Signed)
Pt. Is resisting care and refusing medicines. MD Rai aware and has seen behavior this morning. She and I both have spoke with granddaughter.

## 2016-01-07 NOTE — Progress Notes (Addendum)
[facility]                                                                              Patient Demographics  Tamara Arnold, is a 82 y.o. female, DOB - 08-Sep-1925, [id number]  Admit date - 01/04/2016   Admitting Physician Karmen Bongo, MD  Outpatient Primary MD for the patient is Stephens Shire, MD  Outpatient specialists:   LOS - 3  days    Chief Complaint  Patient presents with  . Altered Mental Status       Brief summary   Patient is a 82 year old female with chronic headaches, some paranoia/psychiatry can illness, dementia presented with acute encephalopathy. She did have one episode of vomiting prior to admission. Per admission history, was not responding appropriately, no talking with glazed look and staring. Family brought patient to ED for further workup.   Assessment & Plan    Principal Problem: Acute encephalopathy: Unclear cause, Appears to have worsening of her underlying psychiatry illness, dementia - Patient has waxing and waning mental status. Examined twice at separate times in front of the granddaughter today who also assisted, patient refused the commands deliberately, refused to open the eyes or assist in neuro exam. She appeared to be alert, oriented and comprehending the requests. Patient was requested to grasp my fingers or lift her arms or legs, she continued to resist. On asking to open the eyes, she shut them more tight. Per RN staff, patient was acting normal when not in front of the family. - Consulted psychiatry as patient has underlying psychiatry illness with paranoia and depression, dementia -CT head, MRI negative for any CVA - UA negative for UTI, LP done, studies negative for any meningitis. Will add HSV PCR to the studies. - Chest x-ray negative for any pneumonia, for now continue IV vancomycin and Zosyn - EEG consistent with possible mild encephalopathy, no seizure -B-1 pending, ceruloplasmin, B12, folate normal  -  ammonia level normal  Lactic acidosis, leukocytosis, right lung pneumonia - CT abdomen showed right lung pneumonia, continue IV antibiotics, blood cultures negative so far  - Continue IV fluid hydration - CSF cultures negative - blood cultures negative, sepsis ruled out   Headache with h/o paranoid/delusional thinking -Patient was last seen by neurology (Dr. Jannifer Franklin) in March 2017.  She was showing evidence of paranoid/delusional thinking at that time and also had a score of 22/30 on the MMSE suggesting mild dementia.  HTN uncontrolled -Restarted outpatient antihypertensives  Constipation - CT abdomen also showed significant stool, placed on stool softeners, had large BM today -Family reports long-standing constipation, continue Dulcolax suppository as needed   Elevated troponin - Likely due to #1, sepsis physiology - Follow 2-D echo, currently no complaint of chest pain   Code Status:DO NOT RESUSCITATE  DVT Prophylaxis:  Lovenox  Family Communication: Granddaughter Deanna at the bedside Disposition Plan: Will likely need skilled nursing facility, I had explained it to the granddaughter at the bedside yesterday, PT evaluation pending  Time Spent in minutes 25 minutes  Procedures:  MRI brain LP   Consultants:   None   Antimicrobials:   IV vanc  IV zosyn  Medications  Scheduled Meds: . aspirin  300 mg Rectal Daily   Or  . aspirin  325 mg Oral Daily  . diltiazem  180 mg Oral Daily  . enoxaparin (LOVENOX) injection  40 mg Subcutaneous QHS  . hydrALAZINE  50 mg Oral QID  . lisinopril  40 mg Oral Daily  . metoprolol  2.5 mg Intravenous Q6H  . metoprolol tartrate  50 mg Oral BID  . multivitamin  1 tablet Oral Daily  . piperacillin-tazobactam (ZOSYN)  IV  3.375 g Intravenous Q8H  . polyethylene glycol  34 g Oral Daily  . senna-docusate  1 tablet Oral BID  . vancomycin  1,000 mg Intravenous Q24H   Continuous Infusions:   PRN Meds:.acetaminophen **OR**  acetaminophen, hydrALAZINE, labetalol, LORazepam, metoprolol, morphine injection, ondansetron (ZOFRAN) IV   Antibiotics   Anti-infectives    Start     Dose/Rate Route Frequency Ordered Stop   01/05/16 2200  vancomycin (VANCOCIN) IVPB 1000 mg/200 mL premix     1,000 mg 200 mL/hr over 60 Minutes Intravenous Every 24 hours 01/04/16 2240     01/05/16 0600  piperacillin-tazobactam (ZOSYN) IVPB 3.375 g     3.375 g 12.5 mL/hr over 240 Minutes Intravenous Every 8 hours 01/04/16 2240     01/04/16 2245  vancomycin (VANCOCIN) 1,250 mg in sodium chloride 0.9 % 250 mL IVPB     1,250 mg 166.7 mL/hr over 90 Minutes Intravenous  Once 01/04/16 2240 01/05/16 0326   01/04/16 2245  piperacillin-tazobactam (ZOSYN) IVPB 3.375 g     3.375 g 100 mL/hr over 30 Minutes Intravenous  Once 01/04/16 2240 01/05/16 0232        Subjective:   Naidelin Hauenstein was seen and examined today. Please see above, patient was alert and appeared to be comprehending all the requests. Overnight no issues. No fevers or chills.    Objective:   Vitals:   01/06/16 2354 01/07/16 0228 01/07/16 0501 01/07/16 0911  BP: (!) 190/63 (!) 185/59 (!) 187/65 (!) 195/68  Pulse:  85 84 97  Resp:   18   Temp:   97.7 F (36.5 C)   TempSrc:   Axillary   SpO2:   100%   Weight:      Height:        Intake/Output Summary (Last 24 hours) at 01/07/16 1125 Last data filed at 01/07/16 0502  Gross per 24 hour  Intake              350 ml  Output             1575 ml  Net            -1225 ml     Wt Readings from Last 3 Encounters:  01/04/16 63.5 kg (140 lb)  07/22/15 58.1 kg (128 lb)  12/01/13 64.4 kg (142 lb)     Exam  General:Alert , Refused to cooperate with exam  HEENT:    Neck:  Cardiovascular: S1 S2 auscultated, no rubs, murmurs or gallops. Regular rate and rhythm.  Respiratory: Clear to auscultation bilaterally anteriorly  Gastrointestinal: Soft, nontender, nondistended, + bowel sounds  Ext: no cyanosis clubbing or  edema  Neuro: did not cooperate with exam  Skin: No rashes  Psych:   Data Reviewed:  I have personally reviewed following labs and imaging studies  Micro Results Recent Results (from the past 240 hour(s))  Culture, blood (routine x 2)     Status: None (Preliminary result)   Collection Time: 01/04/16  4:00 PM  Result Value Ref Range Status   Specimen Description BLOOD LEFT HAND  Final   Special Requests BOTTLES DRAWN AEROBIC AND ANAEROBIC 5CC  Final   Culture NO GROWTH 2 DAYS  Final   Report Status PENDING  Incomplete  Culture, blood (routine x 2)     Status: None (Preliminary result)   Collection Time: 01/04/16  4:24 PM  Result Value Ref Range Status   Specimen Description BLOOD RIGHT HAND  Final   Special Requests BOTTLES DRAWN AEROBIC AND ANAEROBIC 5CC  Final   Culture NO GROWTH 2 DAYS  Final   Report Status PENDING  Incomplete  Urine culture     Status: None   Collection Time: 01/04/16  5:41 PM  Result Value Ref Range Status   Specimen Description URINE, RANDOM  Final   Special Requests NONE  Final   Culture NO GROWTH  Final   Report Status 01/05/2016 FINAL  Final  CSF culture with Stat gram stain     Status: None (Preliminary result)   Collection Time: 01/05/16  1:00 AM  Result Value Ref Range Status   Specimen Description CSF  Final   Special Requests Normal  Final   Gram Stain   Final    CYTOSPIN SMEAR WBC PRESENT,BOTH PMN AND MONONUCLEAR NO ORGANISMS SEEN    Culture NO GROWTH 2 DAYS  Final   Report Status PENDING  Incomplete    Radiology Reports Ct Head Wo Contrast  Result Date: 01/04/2016 CLINICAL DATA:  Altered mental status. Generalized weakness over the last 1-2 weeks. EXAM: CT HEAD WITHOUT CONTRAST TECHNIQUE: Contiguous axial images were obtained from the base of the skull through the vertex without intravenous contrast. COMPARISON:  CT head without contrast 05/27/2015. FINDINGS: Brain: A tiny arachnoid cyst is again noted near the vertex on the right. No  acute infarct, hemorrhage, or mass lesion is present. Minimal atrophy and white matter disease is within normal limits for age. The basal ganglia and insular ribbon are normal. No focal cortical lesions are present. Vascular: Atherosclerotic calcifications are present within the cavernous internal carotid arteries and at the dural margin of the vertebral arteries bilaterally. Skull: The calvarium is intact. Sinuses/Orbits: A polyp or mucous retention cyst within the right sphenoid sinus is stable. The remaining paranasal sinuses and the mastoid air cells are clear. The patient is status post bilateral lens replacements. The globes and orbits are otherwise intact. Other: No significant extracranial soft tissue lesion is present. IMPRESSION: 1. No acute intracranial abnormality or significant interval change. 2. Atherosclerosis. Electronically Signed   By: San Morelle M.D.   On: 01/04/2016 16:57   Mr Brain W Or Wo Contrast  Result Date: 01/04/2016 CLINICAL DATA:  Last seen normal at 1800 hours yesterday. Headache, altered mental status. Generalized weakness for 1-2 weeks. History of hypertension, hyperlipidemia, migraines, diabetes. EXAM: MRI HEAD WITHOUT AND WITH CONTRAST TECHNIQUE: Multiplanar, multiecho pulse sequences of the brain and surrounding structures were obtained without and with intravenous contrast. CONTRAST:  82mL MULTIHANCE GADOBENATE DIMEGLUMINE 529 MG/ML IV SOLN COMPARISON:  CT HEAD January 04, 2016 at 1641 hours and MRI head November 29, 2011 and CT HEAD February 14, 2010 FINDINGS: INTRACRANIAL CONTENTS: No reduced diffusion to suggest acute ischemia. No susceptibility artifact to suggest hemorrhage. The ventricles and sulci are normal for patient's age. Minimal white matter changes compatible chronic small vessel ischemic disease, less than expected for age. No suspicious parenchymal signal, mass effect. Stable 11 x 13 x 13 mm homogeneously bright  T2 RIGHT parietal cortical based lesion  completely suppresses on FLAIR sequence, without enhancement or solid component. No abnormal intraparenchymal or extra-axial enhancement. No abnormal extra-axial fluid collections. Resolution of RIGHT parietal/parafalcine fluid collection with very minimal hemosiderin staining. No extra-axial masses. Normal major intracranial vascular flow voids present at skull base. ORBITS: The included ocular globes and orbital contents are non-suspicious. Status post bilateral ocular lens implants. SINUSES: Small LEFT sphenoid mucosal retention cyst. Trace RIGHT mastoid effusion. SKULL/SOFT TISSUES: No abnormal sellar expansion. No suspicious calvarial bone marrow signal. Craniocervical junction maintained. IMPRESSION: No acute intracranial process. Slightly larger 11 x 11 x 13 cyst RIGHT parietal benign-appearing cyst, possibly neural glial though, recommend close attention on follow-up imaging. Resolution of RIGHT parietal extra-axial fluid collection, which was most compatible with blood products. Electronically Signed   By: Elon Alas M.D.   On: 01/04/2016 22:14   Ct Abdomen Pelvis W Contrast  Result Date: 01/06/2016 CLINICAL DATA:  Abdominal pain EXAM: CT ABDOMEN AND PELVIS WITH CONTRAST TECHNIQUE: Multidetector CT imaging of the abdomen and pelvis was performed using the standard protocol following bolus administration of intravenous contrast. CONTRAST:  59mL ISOVUE-300 IOPAMIDOL (ISOVUE-300) INJECTION 61% COMPARISON:  CT abdomen pelvis 03/20/2013 FINDINGS: Lower chest: Tiny pleural effusions bilaterally. Right middle lobe infiltrate, possible pneumonia. Mild left lower lobe atelectasis/ infiltrate. Hepatobiliary: Postop cholecystectomy. Small cyst in the left lobe of the liver unchanged. No biliary dilatation. Pancreas: Pancreatic atrophy.  No mass or edema. Spleen: Negative Adrenals/Urinary Tract: No renal mass or obstruction. No urinary tract stone. Urinary bladder empty with a Foley catheter in place.  Stomach/Bowel: Negative for bowel obstruction. Constipation with extensive stool throughout the colon. Appendectomy. No bowel edema. Vascular/Lymphatic: Atherosclerotic calcification throughout the abdominal aorta and its branches. No aneurysm. No adenopathy. Reproductive: Hysterectomy.  No pelvic mass. Other: Ventral hernia repair with mesh. 2 x 3 cm hernia along the lower margin of the mesh is unchanged from the prior study. Bowel loops extend into the hernia. No significant edema or fluid in the hernia sac. Musculoskeletal: Pedicle screw fusion T12 through L3. Multilevel degenerative change. Negative for fracture. No acute skeletal abnormality. IMPRESSION: Right middle lobe infiltrate, possible pneumonia. Chest x-ray recommended. Tiny pleural effusions bilaterally Constipation with extensive stool throughout the colon. Negative for bowel obstruction Ventral hernia recurrence the lower margin of the mesh unchanged from prior CT. Bowel loops extend into the hernia without edema or fluid in the hernia sac. Electronically Signed   By: Franchot Gallo M.D.   On: 01/06/2016 11:56   Dg Chest Port 1 View  Result Date: 01/04/2016 CLINICAL DATA:  Generalized weakness for the last 2 weeks EXAM: PORTABLE CHEST 1 VIEW COMPARISON:  03/24/2013 FINDINGS: Cardiomediastinal silhouette is stable. No acute infiltrate or pleural effusion. No pulmonary edema. Again noted postsurgical changes with metallic fixation rods and transpedicular screws lumbar spine. IMPRESSION: No active disease. Electronically Signed   By: Lahoma Crocker M.D.   On: 01/04/2016 16:38   Dg Fluoro Guide Lumbar Puncture  Result Date: 01/05/2016 CLINICAL DATA:  CT tonic patient. Headaches and shortness of breath. EXAM: DIAGNOSTIC LUMBAR PUNCTURE UNDER FLUOROSCOPIC GUIDANCE FLUOROSCOPY TIME:  Radiation Exposure Index (as provided by the fluoroscopic device): Dose area product equals 55.58 mcGy meter squared If the device does not provide the exposure index:  Fluoroscopy Time (in minutes and seconds):  0 minutes 24 seconds Number of Acquired Images:  1 last image hold PROCEDURE: Informed consent was obtained from the patient prior to the procedure, including potential complications of  headache, allergy, and pain. With the patient prone, the lower back was prepped with Betadine. 1% Lidocaine was used for local anesthesia. Lumbar puncture was performed at the L4-5 level using a 20 gauge needle with return of clear CSF with an opening pressure of 8 cm water. 8 ml of CSF were obtained for laboratory studies. The patient tolerated the procedure well and there were no apparent complications. IMPRESSION: Uncomplicated lumbar puncture performed at the L4-5 level under fluoroscopic guidance. Electronically Signed   By: Lucienne Capers M.D.   On: 01/05/2016 02:34    Lab Data:  CBC:  Recent Labs Lab 01/04/16 1600 01/04/16 1813 01/05/16 0916 01/06/16 0250 01/07/16 0323  WBC 18.6*  --  23.0* 18.6* 18.7*  NEUTROABS 15.3*  --   --   --   --   HGB 13.6 13.6 11.4* 11.0* 10.5*  HCT 39.9 40.0 33.9* 33.9* 32.0*  MCV 86.0  --  86.5 86.5 87.9  PLT 275  --  253 282 [id number]   Basic Metabolic Panel:  Recent Labs Lab 01/04/16 1600 01/04/16 1813 01/05/16 0916 01/06/16 0250 01/07/16 0323  NA 133* 136 136 137 138  K 3.0* 3.1* 3.4* 3.1* 3.9  CL 92* 93* 101 102 102  CO2 22  --  25 26 28   GLUCOSE 180* 186* 219* 172* 180*  BUN 21* 21* 17 15 21*  CREATININE 1.10* 0.80 0.84 0.89 0.85  CALCIUM 10.4*  --  9.2 9.1 9.2   GFR: Estimated Creatinine Clearance: 41 mL/min (by C-G formula based on SCr of 0.85 mg/dL). Liver Function Tests:  Recent Labs Lab 01/04/16 1600 01/05/16 0916  AST 32 24  ALT 16 14  ALKPHOS 37* 32*  BILITOT 1.2 0.6  PROT 7.9 6.4*  ALBUMIN 4.0 3.2*    Recent Labs Lab 01/04/16 1600  LIPASE 27    Recent Labs Lab 01/04/16 1600 01/05/16 0916  AMMONIA 26 22   Coagulation Profile:  Recent Labs Lab 01/04/16 1600  INR 1.02    Cardiac Enzymes:  Recent Labs Lab 01/04/16 1757 01/04/16 2249 01/05/16 0309 01/05/16 0916 01/05/16 1308 01/05/16 1800  CKTOTAL 60  --   --   --   --   --   TROPONINI  --  0.15* 0.16* 0.19*  0.18* 0.22* 0.24*   BNP (last 3 results) No results for input(s): PROBNP in the last 8760 hours. HbA1C:  Recent Labs  01/05/16 0309  HGBA1C 6.3*   CBG:  Recent Labs Lab 01/04/16 2031 01/04/16 2233 01/07/16 0626  GLUCAP 182* 188* 146*   Lipid Profile:  Recent Labs  01/05/16 0309  CHOL 133  HDL 82  LDLCALC 40  TRIG 55  CHOLHDL 1.6   Thyroid Function Tests:  Recent Labs  01/04/16 2005  TSH 0.765   Anemia Panel:  Recent Labs  01/05/16 0916  VITAMINB12 1,295*  FOLATE 46.4   Urine analysis:    Component Value Date/Time   COLORURINE YELLOW 01/04/2016 [facility] 01/04/2016 1740   LABSPEC 1.020 01/04/2016 1740   PHURINE 6.5 01/04/2016 1740   GLUCOSEU NEGATIVE 01/04/2016 1740   HGBUR NEGATIVE 01/04/2016 1740   BILIRUBINUR NEGATIVE 01/04/2016 1740   KETONESUR 15 (A) 01/04/2016 1740   PROTEINUR >300 (A) 01/04/2016 1740   UROBILINOGEN 0.2 03/20/2013 1403   NITRITE NEGATIVE 01/04/2016 1740   LEUKOCYTESUR NEGATIVE 01/04/2016 1740     RAI,RIPUDEEP M.D. [facility] 01/07/2016, 11:25 AM  Pager: [phone] Between 7am to 7pm - call Pager - [phone]  After  7pm go to [email or web address] - password [email or web address]  Call night coverage person covering after 7pm

## 2016-01-07 NOTE — Progress Notes (Signed)
Paged on call Card fellow Fudim about pt HR'S still uncontrolled in 170s-190s. Pt given 50 mg PO hydralazine and 50 mg PO metroprolol. Per MD order continue to monitor patient and let PO medication work and hold off on BP meds until 5am. Will continue to monitor patient

## 2016-01-07 NOTE — Consult Note (Signed)
[facility] Face-to-Face Psychiatry Consult   Reason for Consult:  Depression with the paranoia Referring Physician:  Dr. Tana Coast Patient Identification: Tamara Arnold MRN:  [id number] Principal Diagnosis: Catatonia [facility]) Diagnosis:   Patient Active Problem List   Diagnosis Date Noted  . Tachycardia [R00.0]   . Altered mental status [R41.82] 01/04/2016  . Catatonia ([place]) [F06.1] 01/04/2016  . Delusional disorder ([place]) [F22] 07/22/2015  . Vomiting [R11.10] 09/12/2012  . Dehydration [E86.0] 09/12/2012  . Hyponatremia [E87.1] 09/12/2012  . Constipation [K59.00] 01/23/2012  . Anemia [D64.9] 01/23/2012  . Essential hypertension [I10]   . Migraine [G43.909]   . Reflux [K21.9]   . Acute renal failure ([place]) [N17.9] 12/01/2011  . Hypokalemia [E87.6] 11/29/2011  . Nonspecific abnormal findings on radiological and examination of skull and head [R93.0] 11/29/2011  . Headache [R51] 11/28/2011  . Accelerated hypertension [I10] 11/28/2011  . Diabetes mellitus ([place]) [E11.9] 11/28/2011  . H/O major depression [Z86.59] 11/28/2011  . Hyperlipidemia [E78.5] 11/28/2011    Total Time spent with patient: 1 hour  Subjective:   Tamara Arnold is a 82 y.o. female patient admitted with depression, cognitive deficit and paranoia.  HPI:  Patient is a 82 year old female complaining of headache, frequent falls, depression, paranoid thoughts about people talking about her. Patient is worried about forgetfulness and occasionally stubborn. Reportedly patient has been talkative sometimes and withdrawn other times. Reportedly patient has been withdrawn and not able to communicate when she has disability rating chronic headaches. Patient is able to communicate fine and she has some control on her headaches. Reportedly patient husband died about a year and half ago which might have some emotional impact on her. Patient stated she does not even remember that. Information for this evaluation obtained from patient to  daughter-in-law's and granddaughter who were at bedside. Reportedly patient staying by herself and feeling more insecure secondary to falls and also blames herself for not calling for help. Patient is briefly dysphoric during this evaluation. Patient denies current suicidal/homicidal ideation, intention or plans. Patient has no auditory or visual hallucinations, and delusions.patient family reported patient has a mild paranoia and low self-esteem and insecurity building up with her age.  Patient primary care doctor has given trial of citalopram and also was taken amitriptyline for a long time for headaches which was recently discontinued because she is not doing well.  Patient has a fair orientation time, place and person and memory 3 out of 3 for immediate and 2 out of 3 for delayed and has fair concentration and language functions but she could not do saying/provider interpretation. Patient is not answered most of the questions secondary to lack of insecurity and low self-esteem.   Past Psychiatric History: Patient has no known previous acute psychiatric hospitalization or psychiatric medication management.   Risk to Self:   none  Risk to Others:   Prior Inpatient Therapy:   Prior Outpatient Therapy:    Past Medical History:  Past Medical History:  Diagnosis Date  . Anemia   . Anxiety   . Arthritis    "neck, back; none in my knees" (03/20/2013)  . Chronic lower back pain   . Daily headache    "all the time for 37 yrs" (03/20/2013)  . Depression   . GERD (gastroesophageal reflux disease)   . History of blood transfusion 02/2013   "related to this recent back OR" (03/20/2013)  . Hyperlipidemia   . Hypertension   . Macular degeneration of both eyes   . Migraine    "  not migraines/neurologist" (09/12/2012)  . Shortness of breath    "lately cause my stomach is swollen" (03/20/2013)  . Skin cancer    "on my face alot; had one cut off left leg before; (03/20/2013)  . Type II diabetes  mellitus ([place])    off all meds now    Past Surgical History:  Procedure Laterality Date  . ABDOMINAL HYSTERECTOMY    . APPENDECTOMY  1934  . BACK SURGERY  03/10/2013   [facility]-spinal stenosis  . CARPAL TUNNEL RELEASE Right 12/03/2013   Procedure: RIGHT CARPAL TUNNEL RELEASE;  Surgeon: Wynonia Sours, MD;  Location: [place];  Service: Orthopedics;  Laterality: Right;  . CATARACT EXTRACTION W/ INTRAOCULAR LENS  IMPLANT, BILATERAL Bilateral   . CHOLECYSTECTOMY  1970's?  . CYSTECTOMY     "top of my head; removed @ [facility]; it was benign" (09/12/2012)  . HERNIA REPAIR    . LUMBAR PUNCTURE  01/05/2016   Archie Endo 01/05/2016  . SKIN CANCER EXCISION     LLE  . TRIGGER FINGER RELEASE Right 12/03/2013   Procedure: RELEASE TRIGGER FINGER/A-1 PULLEY SMALL FINGER;  Surgeon: Wynonia Sours, MD;  Location: [facility];  Service: Orthopedics;  Laterality: Right;  . UMBILICAL HERNIA REPAIR     Family History:  Family History  Problem Relation Age of Onset  . Diabetes type II Father    Family Psychiatric  History: Has no known family history of mental illness  Social History:  History  Alcohol Use No     History  Drug Use No    Social History   Social History  . Marital status: Widowed    Spouse name: N/A  . Number of children: 4  . Years of education: 1   Occupational History  . retired    Social History Main Topics  . Smoking status: Never Smoker  . Smokeless tobacco: Never Used  . Alcohol use No  . Drug use: No  . Sexual activity: No   Other Topics Concern  . None   Social History Narrative   Lives alone   No caffeine use   Additional Social History:    Allergies:   Allergies  Allergen Reactions  . Gabapentin Other (See Comments)    headaches  . Prednisone Other (See Comments)    Headaches-all steroids  . Topiramate Other (See Comments)    Makes headaches worse  . Wellbutrin [Bupropion Hcl] Other (See Comments)    Caused headaches     Labs:  Results for orders placed or performed during the hospital encounter of 01/04/16 (from the past 48 hour(s))  Troponin I (q 6hr x 3)     Status: Abnormal   Collection Time: 01/05/16  1:08 PM  Result Value Ref Range   Troponin I 0.22 (HH) <0.03 ng/mL    Comment: CRITICAL VALUE NOTED.  VALUE IS CONSISTENT WITH PREVIOUSLY REPORTED AND CALLED VALUE.  Troponin I (q 6hr x 3)     Status: Abnormal   Collection Time: 01/05/16  6:00 PM  Result Value Ref Range   Troponin I 0.24 (HH) <0.03 ng/mL    Comment: CRITICAL VALUE NOTED.  VALUE IS CONSISTENT WITH PREVIOUSLY REPORTED AND CALLED VALUE.  CBC     Status: Abnormal   Collection Time: 01/06/16  2:50 AM  Result Value Ref Range   WBC 18.6 (H) 4.0 - 10.5 K/uL   RBC 3.92 3.87 - 5.11 MIL/uL   Hemoglobin 11.0 (L) 12.0 - 15.0 g/dL   HCT  33.9 (L) 36.0 - 46.0 %   MCV 86.5 78.0 - 100.0 fL   MCH 28.1 26.0 - 34.0 pg   MCHC 32.4 30.0 - 36.0 g/dL   RDW 16.4 (H) 11.5 - 15.5 %   Platelets 282 150 - 400 K/uL  Basic metabolic panel     Status: Abnormal   Collection Time: 01/06/16  2:50 AM  Result Value Ref Range   Sodium 137 135 - 145 mmol/L   Potassium 3.1 (L) 3.5 - 5.1 mmol/L   Chloride 102 101 - 111 mmol/L   CO2 26 22 - 32 mmol/L   Glucose, Bld 172 (H) 65 - 99 mg/dL   BUN 15 6 - 20 mg/dL   Creatinine, Ser 0.89 0.44 - 1.00 mg/dL   Calcium 9.1 8.9 - 10.3 mg/dL   GFR calc non Af Amer 56 (L) >60 mL/min   GFR calc Af Amer >60 >60 mL/min    Comment: (NOTE) The eGFR has been calculated using the CKD EPI equation. This calculation has not been validated in all clinical situations. eGFR's persistently <60 mL/min signify possible Chronic Kidney Disease.    Anion gap 9 5 - 15  CBC     Status: Abnormal   Collection Time: 01/07/16  3:23 AM  Result Value Ref Range   WBC 18.7 (H) 4.0 - 10.5 K/uL   RBC 3.64 (L) 3.87 - 5.11 MIL/uL   Hemoglobin 10.5 (L) 12.0 - 15.0 g/dL   HCT 32.0 (L) 36.0 - 46.0 %   MCV 87.9 78.0 - 100.0 fL   MCH 28.8 26.0 -  34.0 pg   MCHC 32.8 30.0 - 36.0 g/dL   RDW 16.7 (H) 11.5 - 15.5 %   Platelets 275 150 - 400 K/uL  Basic metabolic panel     Status: Abnormal   Collection Time: 01/07/16  3:23 AM  Result Value Ref Range   Sodium 138 135 - 145 mmol/L   Potassium 3.9 3.5 - 5.1 mmol/L    Comment: DELTA CHECK NOTED   Chloride 102 101 - 111 mmol/L   CO2 28 22 - 32 mmol/L   Glucose, Bld 180 (H) 65 - 99 mg/dL   BUN 21 (H) 6 - 20 mg/dL   Creatinine, Ser 0.85 0.44 - 1.00 mg/dL   Calcium 9.2 8.9 - 10.3 mg/dL   GFR calc non Af Amer 59 (L) >60 mL/min   GFR calc Af Amer >60 >60 mL/min    Comment: (NOTE) The eGFR has been calculated using the CKD EPI equation. This calculation has not been validated in all clinical situations. eGFR's persistently <60 mL/min signify possible Chronic Kidney Disease.    Anion gap 8 5 - 15  Glucose, capillary     Status: Abnormal   Collection Time: 01/07/16  6:26 AM  Result Value Ref Range   Glucose-Capillary 146 (H) 65 - 99 mg/dL    Current Facility-Administered Medications  Medication Dose Route Frequency Provider Last Rate Last Dose  . acetaminophen (TYLENOL) tablet 650 mg  650 mg Oral Q4H PRN Karmen Bongo, MD   650 mg at 01/06/16 1610   Or  . acetaminophen (TYLENOL) suppository 650 mg  650 mg Rectal Q4H PRN Karmen Bongo, MD      . aspirin suppository 300 mg  300 mg Rectal Daily Karmen Bongo, MD       Or  . aspirin tablet 325 mg  325 mg Oral Daily Karmen Bongo, MD   325 mg at 01/06/16 1159  . diltiazem (CARDIZEM  CD) 24 hr capsule 180 mg  180 mg Oral Daily Troy Sine, MD      . enoxaparin (LOVENOX) injection 40 mg  40 mg Subcutaneous QHS Karmen Bongo, MD   40 mg at 01/06/16 2119  . hydrALAZINE (APRESOLINE) injection 10 mg  10 mg Intravenous Q4H PRN Karmen Bongo, MD   10 mg at 01/07/16 1157  . hydrALAZINE (APRESOLINE) tablet 50 mg  50 mg Oral QID Ripudeep Krystal Eaton, MD   50 mg at 01/06/16 2119  . labetalol (NORMODYNE,TRANDATE) injection 10 mg  10 mg Intravenous  Q2H PRN Karmen Bongo, MD   10 mg at 01/05/16 2620  . lisinopril (PRINIVIL,ZESTRIL) tablet 40 mg  40 mg Oral Daily Karmen Bongo, MD   40 mg at 01/06/16 1158  . LORazepam (ATIVAN) injection 0.5 mg  0.5 mg Intravenous Q6H PRN Ripudeep K Rai, MD      . metoprolol (LOPRESSOR) injection 2.5 mg  2.5 mg Intravenous Q6H Ripudeep K Rai, MD   2.5 mg at 01/07/16 0505  . metoprolol (LOPRESSOR) injection 2.5 mg  2.5 mg Intravenous PRN Troy Sine, MD      . metoprolol (LOPRESSOR) tablet 50 mg  50 mg Oral BID Troy Sine, MD      . morphine 2 MG/ML injection 2 mg  2 mg Intravenous Q2H PRN Karmen Bongo, MD   2 mg at 01/05/16 0552  . multivitamin (PROSIGHT) tablet 1 tablet  1 tablet Oral Daily Karmen Bongo, MD   1 tablet at 01/06/16 1849  . ondansetron (ZOFRAN) injection 4 mg  4 mg Intravenous Q6H PRN Karmen Bongo, MD   4 mg at 01/06/16 0317  . piperacillin-tazobactam (ZOSYN) IVPB 3.375 g  3.375 g Intravenous Q8H Lauren D Bajbus, RPH   3.375 g at 01/07/16 0505  . polyethylene glycol (MIRALAX / GLYCOLAX) packet 34 g  34 g Oral Daily Karmen Bongo, MD   34 g at 01/06/16 1200  . senna-docusate (Senokot-S) tablet 1 tablet  1 tablet Oral BID Ripudeep Krystal Eaton, MD   1 tablet at 01/06/16 2119  . vancomycin (VANCOCIN) IVPB 1000 mg/200 mL premix  1,000 mg Intravenous Q24H Lauren D Bajbus, RPH   1,000 mg at 01/06/16 2119    Musculoskeletal: Strength & Muscle Tone: decreased Gait & Station: unable to stand Patient leans: N/A  Psychiatric Specialty Exam: Physical Exam as per history and physical   ROS complaining about frequent headaches feeling depressed, insecure because frequent falls and questionable orthostatic hypotension. Patient has no chest pain or shortness of breath. No Fever-chills, No Headache, No changes with Vision or hearing, reports vertigo No problems swallowing food or Liquids, No Chest pain, Cough or Shortness of Breath, No Abdominal pain, No Nausea or Vommitting, Bowel movements are  regular, No Blood in stool or Urine, No dysuria, No new skin rashes or bruises, No new joints pains-aches,  No new weakness, tingling, numbness in any extremity, No recent weight gain or loss, No polyuria, polydypsia or polyphagia,   A full 10 point Review of Systems was done, except as stated above, all other Review of Systems were negative.  Blood pressure (!) 195/68, pulse 97, temperature 97.7 F (36.5 C), temperature source Axillary, resp. rate 18, height _0  (1.6 m), weight 63.5 kg (140 lb), SpO2 100 %.Body mass index is 24.8 kg/m.  General Appearance: Casual  Eye Contact:  Good  Speech:  Clear and Coherent and Slow  Volume:  Decreased  Mood:  Anxious and Depressed  Affect:  Constricted and Depressed  Thought Process:  Coherent and Goal Directed  Orientation:  Full (Time, Place, and Person)  Thought Content:  Rumination  Suicidal Thoughts:  No  Homicidal Thoughts:  No  Memory:  Immediate;   Fair Recent;   Fair   Judgement:  Fair  Insight:  Fair  Psychomotor Activity:  Decreased  Concentration:  Concentration: Good and Attention Span: Fair  Recall:  [employer] of Knowledge:  Fair  Language:  Good  Akathisia:  Negative  Handed:  Right  AIMS (if indicated):     Assets:  Communication Skills Desire for Improvement Financial Resources/Insurance Housing Leisure Time Resilience Social Support Transportation  ADL's:  Intact  Cognition:  Impaired,  Mild  Sleep:        Treatment Plan Summary: Romanita Fager is 82 years old female complaining depression, anxiety, mild paranoia about people talking about her from church. Patient is also suffering with frequent falls secondary to possible orthostatic hypotension and chronic headaches probably migraine related. Patient has no active suicidal/homicidal ideation or psychotic symptoms.  Medication management: Patient has allergic reaction to Topamax and gabapentin  Will start Trileptal 150 mg twice daily headache and  mood Restart Seroquel 25 mg at bedtime or paranoia. Appreciate psychiatric consultation and we sign off as of today Please contact [phone] or [phone] if needs further assistance   Disposition: Patient benefit from short-term skilled nursing facility. Patient does not meet criteria for psychiatric inpatient admission. Supportive therapy provided about ongoing stressors.  Ambrose Finland, MD 01/07/2016 9:40 AM

## 2016-01-07 NOTE — Care Management Important Message (Signed)
Important Message  Patient Details  Name: Tamara Arnold MRN: [id number] Date of Birth: 03/04/1926   Medicare Important Message Given:  Yes    Nathen May 01/07/2016, 10:39 AM

## 2016-01-07 NOTE — Progress Notes (Signed)
  Echocardiogram 2D Echocardiogram has been performed.  Tamara Arnold 01/07/2016, 2:41 PM

## 2016-01-08 DIAGNOSIS — E871 Hypo-osmolality and hyponatremia: Secondary | ICD-10-CM | POA: Diagnosis not present

## 2016-01-08 DIAGNOSIS — I471 Supraventricular tachycardia: Secondary | ICD-10-CM | POA: Diagnosis not present

## 2016-01-08 DIAGNOSIS — I10 Essential (primary) hypertension: Secondary | ICD-10-CM | POA: Diagnosis not present

## 2016-01-08 DIAGNOSIS — G43909 Migraine, unspecified, not intractable, without status migrainosus: Secondary | ICD-10-CM | POA: Diagnosis not present

## 2016-01-08 DIAGNOSIS — J189 Pneumonia, unspecified organism: Secondary | ICD-10-CM | POA: Diagnosis not present

## 2016-01-08 DIAGNOSIS — E872 Acidosis: Secondary | ICD-10-CM | POA: Diagnosis not present

## 2016-01-08 DIAGNOSIS — F061 Catatonic disorder due to known physiological condition: Secondary | ICD-10-CM | POA: Diagnosis not present

## 2016-01-08 DIAGNOSIS — E1165 Type 2 diabetes mellitus with hyperglycemia: Secondary | ICD-10-CM | POA: Diagnosis not present

## 2016-01-08 DIAGNOSIS — G9340 Encephalopathy, unspecified: Secondary | ICD-10-CM | POA: Diagnosis not present

## 2016-01-08 DIAGNOSIS — R51 Headache: Secondary | ICD-10-CM | POA: Diagnosis not present

## 2016-01-08 DIAGNOSIS — I248 Other forms of acute ischemic heart disease: Secondary | ICD-10-CM

## 2016-01-08 DIAGNOSIS — F22 Delusional disorders: Secondary | ICD-10-CM | POA: Diagnosis not present

## 2016-01-08 DIAGNOSIS — Z66 Do not resuscitate: Secondary | ICD-10-CM | POA: Diagnosis not present

## 2016-01-08 DIAGNOSIS — Z8659 Personal history of other mental and behavioral disorders: Secondary | ICD-10-CM | POA: Diagnosis not present

## 2016-01-08 DIAGNOSIS — F0390 Unspecified dementia without behavioral disturbance: Secondary | ICD-10-CM | POA: Diagnosis not present

## 2016-01-08 DIAGNOSIS — K5901 Slow transit constipation: Secondary | ICD-10-CM | POA: Diagnosis not present

## 2016-01-08 LAB — BASIC METABOLIC PANEL
ANION GAP: 11 (ref 5–15)
BUN: 24 mg/dL — AB (ref 6–20)
CALCIUM: 9.1 mg/dL (ref 8.9–10.3)
CO2: 25 mmol/L (ref 22–32)
CREATININE: 0.99 mg/dL (ref 0.44–1.00)
Chloride: 103 mmol/L (ref 101–111)
GFR calc Af Amer: 57 mL/min — ABNORMAL LOW (ref >60)
GFR, EST NON AFRICAN AMERICAN: 49 mL/min — AB (ref >60)
GLUCOSE: 263 mg/dL — AB (ref 65–99)
Potassium: 3.4 mmol/L — ABNORMAL LOW (ref 3.5–5.1)
Sodium: 139 mmol/L (ref 135–145)

## 2016-01-08 LAB — MRSA PCR SCREENING: MRSA by PCR: NEGATIVE

## 2016-01-08 LAB — CBC
HEMATOCRIT: 31.9 % — AB (ref 36.0–46.0)
Hemoglobin: 10.4 g/dL — ABNORMAL LOW (ref 12.0–15.0)
MCH: 28.6 pg (ref 26.0–34.0)
MCHC: 32.6 g/dL (ref 30.0–36.0)
MCV: 87.6 fL (ref 78.0–100.0)
PLATELETS: 274 10*3/uL (ref 150–400)
RBC: 3.64 MIL/uL — ABNORMAL LOW (ref 3.87–5.11)
RDW: 16.9 % — AB (ref 11.5–15.5)
WBC: 11.4 10*3/uL — AB (ref 4.0–10.5)

## 2016-01-08 LAB — HSV(HERPES SMPLX VRS)ABS-I+II(IGG)-CSF: HSV TYPE I/II AB, IGG CSF: 1.29 IV — AB (ref <=0.89)

## 2016-01-08 LAB — CSF CULTURE W GRAM STAIN: Culture: NO GROWTH

## 2016-01-08 LAB — CSF CULTURE: SPECIAL REQUESTS: NORMAL

## 2016-01-08 MED ORDER — QUETIAPINE FUMARATE 25 MG PO TABS
25.0000 mg | ORAL_TABLET | Freq: Every day | ORAL | Status: DC
  Administered 2016-01-08 – 2016-01-10 (×2): 25 mg via ORAL
  Filled 2016-01-08 (×3): qty 1

## 2016-01-08 MED ORDER — OXCARBAZEPINE 150 MG PO TABS
150.0000 mg | ORAL_TABLET | Freq: Two times a day (BID) | ORAL | Status: DC
  Administered 2016-01-10 – 2016-01-11 (×2): 150 mg via ORAL
  Filled 2016-01-08 (×7): qty 1

## 2016-01-08 MED ORDER — POTASSIUM CHLORIDE CRYS ER 20 MEQ PO TBCR
40.0000 meq | EXTENDED_RELEASE_TABLET | Freq: Once | ORAL | Status: AC
  Administered 2016-01-08: 40 meq via ORAL
  Filled 2016-01-08: qty 2

## 2016-01-08 MED ORDER — METOPROLOL TARTRATE 100 MG PO TABS
100.0000 mg | ORAL_TABLET | Freq: Two times a day (BID) | ORAL | Status: DC
  Administered 2016-01-08 – 2016-01-11 (×4): 100 mg via ORAL
  Filled 2016-01-08 (×6): qty 1

## 2016-01-08 MED ORDER — TRAMADOL HCL 50 MG PO TABS
50.0000 mg | ORAL_TABLET | Freq: Two times a day (BID) | ORAL | Status: DC | PRN
  Administered 2016-01-08: 50 mg via ORAL
  Filled 2016-01-08: qty 1

## 2016-01-08 NOTE — Progress Notes (Signed)
Consulting cardiologist: Dr. Shelva Majestic  Seen for followup: Elevated troponin I  Subjective:    Complains of headache today. No chest pain or shortness of breath.  Objective:   Temp:  [98.6 F (37 C)-98.9 F (37.2 C)] 98.9 F (37.2 C) (08/19 0711) Pulse Rate:  [96-180] 96 (08/19 0711) Resp:  [18] 18 (08/19 0711) BP: (110-188)/(61-96) 156/61 (08/19 0711) SpO2:  [98 %-99 %] 98 % (08/19 0711) Weight:  [124 lb 11.2 oz (56.6 kg)] 124 lb 11.2 oz (56.6 kg) (08/19 0711) Last BM Date: 01/08/16  Filed Weights   01/04/16 1849 01/08/16 0711  Weight: 140 lb (63.5 kg) 124 lb 11.2 oz (56.6 kg)    Intake/Output Summary (Last 24 hours) at 01/08/16 1232 Last data filed at 01/08/16 0700  Gross per 24 hour  Intake             1472 ml  Output              801 ml  Net              671 ml    Telemetry: Sinus rhythm, burst of SVT noted.  Exam:  General: Elderly woman, no distress.  Lungs: Clear, nonlabored.  Cardiac: RRR without gallop.  Extremities: No pitting edema.  Lab Results:  Basic Metabolic Panel:  Recent Labs Lab 01/06/16 0250 01/07/16 0323 01/07/16 1710 01/08/16 0221  NA 137 138  --  139  K 3.1* 3.9  --  3.4*  CL 102 102  --  103  CO2 26 28  --  25  GLUCOSE 172* 180*  --  263*  BUN 15 21*  --  24*  CREATININE 0.89 0.85  --  0.99  CALCIUM 9.1 9.2  --  9.1  MG  --   --  1.6*  --     Liver Function Tests:  Recent Labs Lab 01/04/16 1600 01/05/16 0916  AST 32 24  ALT 16 14  ALKPHOS 37* 32*  BILITOT 1.2 0.6  PROT 7.9 6.4*  ALBUMIN 4.0 3.2*    CBC:  Recent Labs Lab 01/06/16 0250 01/07/16 0323 01/08/16 0221  WBC 18.6* 18.7* 11.4*  HGB 11.0* 10.5* 10.4*  HCT 33.9* 32.0* 31.9*  MCV 86.5 87.9 87.6  PLT 282 275 274    Cardiac Enzymes:  Recent Labs Lab 01/04/16 1757  01/05/16 0916 01/05/16 1308 01/05/16 1800  CKTOTAL 60  --   --   --   --   TROPONINI  --   < > 0.19*  0.18* 0.22* 0.24*  < > = values in this interval not  displayed.  Coagulation:  Recent Labs Lab 01/04/16 1600  INR 1.02   Echocardiogram 01/07/2016: Study Conclusions  - Left ventricle: The cavity size was normal. There was mild   concentric hypertrophy. Systolic function was normal. The   estimated ejection fraction was in the range of 60% to 65%. Wall   motion was normal; there were no regional wall motion   abnormalities. Features are consistent with a pseudonormal left   ventricular filling pattern, with concomitant abnormal relaxation   and increased filling pressure (grade 2 diastolic dysfunction).   Doppler parameters are consistent with elevated mean left atrial   filling pressure. - Aortic valve: There was trivial regurgitation. - Mitral valve: Calcified annulus. Mildly thickened leaflets . - Pulmonary arteries: PA peak pressure: 35 mm Hg (S).  Impressions:  - Incessant PACs and brief atrial tachycardia seen during the   study.  ECG: Tracing from 01/08/2016 showed sinus rhythm with left atrial enlargement, leftward axis, nonspecific ST changes, and poor R wave progression.   Medications:   Scheduled Medications: . aspirin  300 mg Rectal Daily   Or  . aspirin  325 mg Oral Daily  . diltiazem  180 mg Oral Daily  . enoxaparin (LOVENOX) injection  40 mg Subcutaneous QHS  . hydrALAZINE  50 mg Oral QID  . lisinopril  40 mg Oral Daily  . metoprolol tartrate  100 mg Oral BID  . multivitamin  1 tablet Oral Daily  . piperacillin-tazobactam (ZOSYN)  IV  3.375 g Intravenous Q8H  . polyethylene glycol  34 g Oral Daily  . vancomycin  1,000 mg Intravenous Q24H      PRN Medications:  acetaminophen **OR** acetaminophen, hydrALAZINE, labetalol, LORazepam, metoprolol, morphine injection, ondansetron (ZOFRAN) IV, senna-docusate   Assessment:   1. Mildly elevated troponin I in flat pattern. No chest pain. Suspect demand ischemia, not ACS. LVEF normal by echocardiography without wall motion abnormalities.  2. Intermittent  SVT, not clearly symptomatic. Improved following increase in beta blocker dose. Currently on Cardizem CD 180 mg daily as well as Lopressor 100 mg twice daily.  3. Essential hypertension, systolics in the [id number] at this point. Medications include Cardizem CD, hydralazine, lisinopril, and Lopressor.   Plan/Discussion:    Reviewed hospitalist note and other problem list. From a cardiac perspective, would continue present regimen including increase in beta blocker for suppression of SVT and management of hypertension. No further ischemic workup is anticipated. We will sign off for now, call with questions.   Satira Sark, M.D., F.A.C.C.

## 2016-01-08 NOTE — NC FL2 (Signed)
[place] MEDICAID FL2 LEVEL OF CARE SCREENING TOOL     IDENTIFICATION  Patient Name: Tamara Arnold Birthdate: [birth year] Sex: female Admission Date (Current Location): 01/04/2016  [facility] and [place] Number:  Herbalist and Address:  [facility], [place] [address], [place], [place] [address]      Provider Number: [id number]  Attending Physician Name and Address:  Mendel Corning, MD  Relative Name and Phone Number:       Current Level of Care: Hospital Recommended Level of Care: [place] Prior Approval Number:    Date Approved/Denied:   PASRR Number:  [id number]   Discharge Plan: SNF    Current Diagnoses: Patient Active Problem List   Diagnosis Date Noted  . Tachycardia   . Altered mental status 01/04/2016  . Catatonia ([place]) 01/04/2016  . Delusional disorder ([place]) 07/22/2015  . Vomiting 09/12/2012  . Dehydration 09/12/2012  . Hyponatremia 09/12/2012  . Constipation 01/23/2012  . Anemia 01/23/2012  . Essential hypertension   . Migraine   . Reflux   . Acute renal failure ([place]) 12/01/2011  . Hypokalemia 11/29/2011  . Nonspecific abnormal findings on radiological and examination of skull and head 11/29/2011  . Headache 11/28/2011  . Accelerated hypertension 11/28/2011  . Diabetes mellitus ([place]) 11/28/2011  . H/O major depression 11/28/2011  . Hyperlipidemia 11/28/2011    Orientation RESPIRATION BLADDER Height & Weight     Self  O2 (2L) Indwelling catheter Weight: 124 lb 11.2 oz (56.6 kg) Height:  5\' 3"  (160 cm)  BEHAVIORAL SYMPTOMS/MOOD NEUROLOGICAL BOWEL NUTRITION STATUS      Continent Diet (Full Liquid Diet)  AMBULATORY STATUS COMMUNICATION OF NEEDS Skin   Extensive Assist Verbally Normal                       Personal Care Assistance Level of Assistance  Bathing, Feeding, Dressing Bathing Assistance: Maximum assistance Feeding assistance: Limited assistance Dressing Assistance: Maximum  assistance     Functional Limitations Info  Sight, Hearing, Speech Sight Info: Adequate Hearing Info: Adequate Speech Info: Adequate    SPECIAL CARE FACTORS FREQUENCY  PT (By licensed PT), OT (By licensed OT), Speech therapy     PT Frequency: 3 OT Frequency: 3     Speech Therapy Frequency: 2      Contractures Contractures Info: Not present    Additional Factors Info  Code Status, Allergies Code Status Info: DNR Allergies Info: Gabapentin, Prednisone, Topiramate, Wellbutrin Bupropion Hcl           Current Medications (01/08/2016):  This is the current hospital active medication list Current Facility-Administered Medications  Medication Dose Route Frequency Provider Last Rate Last Dose  . acetaminophen (TYLENOL) tablet 650 mg  650 mg Oral Q4H PRN Karmen Bongo, MD   650 mg at 01/08/16 0158   Or  . acetaminophen (TYLENOL) suppository 650 mg  650 mg Rectal Q4H PRN Karmen Bongo, MD      . aspirin suppository 300 mg  300 mg Rectal Daily Karmen Bongo, MD       Or  . aspirin tablet 325 mg  325 mg Oral Daily Karmen Bongo, MD   325 mg at 01/08/16 1009  . diltiazem (CARDIZEM CD) 24 hr capsule 180 mg  180 mg Oral Daily Troy Sine, MD   180 mg at 01/08/16 1009  . enoxaparin (LOVENOX) injection 40 mg  40 mg Subcutaneous QHS Karmen Bongo, MD   40 mg at 01/07/16  2140  . hydrALAZINE (APRESOLINE) injection 10 mg  10 mg Intravenous Q4H PRN Karmen Bongo, MD   10 mg at 01/07/16 2001  . hydrALAZINE (APRESOLINE) tablet 50 mg  50 mg Oral QID Ripudeep K Rai, MD   50 mg at 01/08/16 1009  . labetalol (NORMODYNE,TRANDATE) injection 10 mg  10 mg Intravenous Q2H PRN Karmen Bongo, MD   10 mg at 01/05/16 [id number]  . lisinopril (PRINIVIL,ZESTRIL) tablet 40 mg  40 mg Oral Daily Karmen Bongo, MD   40 mg at 01/08/16 1009  . LORazepam (ATIVAN) injection 0.5 mg  0.5 mg Intravenous Q6H PRN Ripudeep K Rai, MD      . metoprolol (LOPRESSOR) injection 2.5 mg  2.5 mg Intravenous PRN Troy Sine,  MD   2.5 mg at 01/07/16 2112  . metoprolol (LOPRESSOR) tablet 50 mg  50 mg Oral BID Troy Sine, MD   50 mg at 01/08/16 1009  . morphine 2 MG/ML injection 2 mg  2 mg Intravenous Q2H PRN Karmen Bongo, MD   2 mg at 01/05/16 0552  . multivitamin (PROSIGHT) tablet 1 tablet  1 tablet Oral Daily Karmen Bongo, MD   1 tablet at 01/08/16 1010  . ondansetron (ZOFRAN) injection 4 mg  4 mg Intravenous Q6H PRN Karmen Bongo, MD   4 mg at 01/06/16 0317  . piperacillin-tazobactam (ZOSYN) IVPB 3.375 g  3.375 g Intravenous Q8H Lauren D Bajbus, RPH   3.375 g at 01/08/16 0547  . polyethylene glycol (MIRALAX / GLYCOLAX) packet 34 g  34 g Oral Daily Karmen Bongo, MD   34 g at 01/06/16 1200  . senna-docusate (Senokot-S) tablet 1 tablet  1 tablet Oral QHS PRN Ripudeep Krystal Eaton, MD      . vancomycin (VANCOCIN) IVPB 1000 mg/200 mL premix  1,000 mg Intravenous Q24H Lauren D Bajbus, RPH   1,000 mg at 01/07/16 2139     Discharge Medications: Please see discharge summary for a list of discharge medications.  Relevant Imaging Results:  Relevant Lab Results:   Additional Information SSN [id number]    Barbette Or, [place]

## 2016-01-08 NOTE — Progress Notes (Signed)
[facility]                                                                              Patient Demographics  Tamara Arnold, is a 82 y.o. female, DOB - 03/19/1926, [id number]  Admit date - 01/04/2016   Admitting Physician Karmen Bongo, MD  Outpatient Primary MD for the patient is Stephens Shire, MD  Outpatient specialists:   LOS - 4  days    Chief Complaint  Patient presents with  . Altered Mental Status       Brief summary   Patient is a 82 year old female with chronic headaches, some paranoia/psychiatry can illness, dementia presented with acute encephalopathy. She did have one episode of vomiting prior to admission. Per admission history, was not responding appropriately, no talking with glazed look and staring. Family brought patient to ED for further workup.   Assessment & Plan    Principal Problem: Acute encephalopathy: Unclear cause, Waxing and waning mental status with psychiatric illness, depression, dementia - Today patient is alert and oriented 3 and conversing normally. However kept repeating she is dying and going to [place] [facility] as patient has underlying psychiatry illness with paranoia and depression, dementia -CT head, MRI negative for any CVA - UA negative for UTI, LP done, studies negative for any meningitis. Will add HSV PCR to the studies. - Chest x-ray negative for any pneumonia, for now continue IV vancomycin and Zosyn - EEG consistent with possible mild encephalopathy, no seizure -B-1 pending, ceruloplasmin, B12, folate normal  - ammonia level normal  Lactic acidosis, leukocytosis, right lung pneumonia - CT abdomen showed right lung pneumonia, continue IV antibiotics - CSF cultures negative - blood cultures negative, sepsis ruled out  -  will check  MRSA screen, if negative, DC vancomycin, continue Zosyn for now  Headache with h/o paranoid/delusional thinking -Patient was last seen by neurology (Dr.  Jannifer Franklin) in March 2017.  She was showing evidence of paranoid/delusional thinking at that time and also had a score of 22/30 on the MMSE suggesting mild dementia.  HTN uncontrolled with SVTs -Restarted outpatient antihypertensives, increase metoprolol -Continue hydralazine. Cardiology following   Constipation - CT abdomen also showed significant stool, placed on stool softeners, had large BM today -Family reports long-standing constipation, continue Dulcolax suppository as needed   Elevated troponin - Likely due to #1, sepsis physiology - 2-D echo showed EF of 60-65% with grade 2 diastolic dysfunction currently no complaint of chest pain   Code Status:DO NOT RESUSCITATE  DVT Prophylaxis:  Lovenox  Family Communication:  Disposition Plan: Will likely need skilled nursing facility, I had explained it to the granddaughter at the bedside yesterday, PT evaluation pending  Time Spent in minutes 25 minutes  Procedures:  MRI brain LP   Consultants:   None   Antimicrobials:   IV vanc  IV zosyn   Medications  Scheduled Meds: . aspirin  300 mg Rectal Daily   Or  . aspirin  325 mg Oral Daily  . diltiazem  180 mg Oral Daily  . enoxaparin (LOVENOX) injection  40 mg Subcutaneous QHS  . hydrALAZINE  50 mg Oral QID  .  lisinopril  40 mg Oral Daily  . metoprolol tartrate  50 mg Oral BID  . multivitamin  1 tablet Oral Daily  . piperacillin-tazobactam (ZOSYN)  IV  3.375 g Intravenous Q8H  . polyethylene glycol  34 g Oral Daily  . vancomycin  1,000 mg Intravenous Q24H   Continuous Infusions:   PRN Meds:.acetaminophen **OR** acetaminophen, hydrALAZINE, labetalol, LORazepam, metoprolol, morphine injection, ondansetron (ZOFRAN) IV, senna-docusate   Antibiotics   Anti-infectives    Start     Dose/Rate Route Frequency Ordered Stop   01/05/16 2200  vancomycin (VANCOCIN) IVPB 1000 mg/200 mL premix     1,000 mg 200 mL/hr over 60 Minutes Intravenous Every 24 hours 01/04/16 2240       01/05/16 0600  piperacillin-tazobactam (ZOSYN) IVPB 3.375 g     3.375 g 12.5 mL/hr over 240 Minutes Intravenous Every 8 hours 01/04/16 2240     01/04/16 2245  vancomycin (VANCOCIN) 1,250 mg in sodium chloride 0.9 % 250 mL IVPB     1,250 mg 166.7 mL/hr over 90 Minutes Intravenous  Once 01/04/16 2240 01/05/16 0326   01/04/16 2245  piperacillin-tazobactam (ZOSYN) IVPB 3.375 g     3.375 g 100 mL/hr over 30 Minutes Intravenous  Once 01/04/16 2240 01/05/16 0232        Subjective:   Tamara Arnold was seen and examined today. Today conversing normally, alert and oriented. Still somewhat depressed and stating that she is dying . Denies any chest pain, shortness of breath, nausea, vomiting, abdominal pain. No fevers. BP still uncontrolled   Objective:   Vitals:   01/07/16 2132 01/07/16 2324 01/08/16 0153 01/08/16 0711  BP: (!) 126/92  (!) 150/64 (!) 156/61  Pulse: (!) 166 (!) 180 (!) 106 96  Resp:   18 18  Temp:   98.6 F (37 C) 98.9 F (37.2 C)  TempSrc:   Oral Oral  SpO2:   99% 98%  Weight:    56.6 kg (124 lb 11.2 oz)  Height:        Intake/Output Summary (Last 24 hours) at 01/08/16 1208 Last data filed at 01/08/16 0700  Gross per 24 hour  Intake             1472 ml  Output              801 ml  Net              671 ml     Wt Readings from Last 3 Encounters:  01/08/16 56.6 kg (124 lb 11.2 oz)  07/22/15 58.1 kg (128 lb)  12/01/13 64.4 kg (142 lb)     Exam  General: Alert and oriented, NAD   HEENT:    Neck:  Cardiovascular: S1 S2 auscultated, no rubs, murmurs or gallops. Regular rate and rhythm.  Respiratory: Clear to auscultation bilaterally anteriorly  Gastrointestinal: Soft, nontender, nondistended, + bowel sounds  Ext: no cyanosis clubbing or edema  Neuro:  strength 5/5 in upper and lower activities   Skin: No rashes  Psych: More oriented today  Data Reviewed:  I have personally reviewed following labs and imaging studies  Micro Results Recent  Results (from the past 240 hour(s))  Culture, blood (routine x 2)     Status: None (Preliminary result)   Collection Time: 01/04/16  4:00 PM  Result Value Ref Range Status   Specimen Description BLOOD LEFT HAND  Final   Special Requests BOTTLES DRAWN AEROBIC AND ANAEROBIC 5CC  Final   Culture NO GROWTH 3  DAYS  Final   Report Status PENDING  Incomplete  Culture, blood (routine x 2)     Status: None (Preliminary result)   Collection Time: 01/04/16  4:24 PM  Result Value Ref Range Status   Specimen Description BLOOD RIGHT HAND  Final   Special Requests BOTTLES DRAWN AEROBIC AND ANAEROBIC 5CC  Final   Culture NO GROWTH 3 DAYS  Final   Report Status PENDING  Incomplete  Urine culture     Status: None   Collection Time: 01/04/16  5:41 PM  Result Value Ref Range Status   Specimen Description URINE, RANDOM  Final   Special Requests NONE  Final   Culture NO GROWTH  Final   Report Status 01/05/2016 FINAL  Final  CSF culture with Stat gram stain     Status: None   Collection Time: 01/05/16  1:00 AM  Result Value Ref Range Status   Specimen Description CSF  Final   Special Requests Normal  Final   Gram Stain   Final    CYTOSPIN SMEAR WBC PRESENT,BOTH PMN AND MONONUCLEAR NO ORGANISMS SEEN    Culture NO GROWTH 3 DAYS  Final   Report Status 01/08/2016 FINAL  Final    Radiology Reports Ct Head Wo Contrast  Result Date: 01/04/2016 CLINICAL DATA:  Altered mental status. Generalized weakness over the last 1-2 weeks. EXAM: CT HEAD WITHOUT CONTRAST TECHNIQUE: Contiguous axial images were obtained from the base of the skull through the vertex without intravenous contrast. COMPARISON:  CT head without contrast 05/27/2015. FINDINGS: Brain: A tiny arachnoid cyst is again noted near the vertex on the right. No acute infarct, hemorrhage, or mass lesion is present. Minimal atrophy and white matter disease is within normal limits for age. The basal ganglia and insular ribbon are normal. No focal cortical  lesions are present. Vascular: Atherosclerotic calcifications are present within the cavernous internal carotid arteries and at the dural margin of the vertebral arteries bilaterally. Skull: The calvarium is intact. Sinuses/Orbits: A polyp or mucous retention cyst within the right sphenoid sinus is stable. The remaining paranasal sinuses and the mastoid air cells are clear. The patient is status post bilateral lens replacements. The globes and orbits are otherwise intact. Other: No significant extracranial soft tissue lesion is present. IMPRESSION: 1. No acute intracranial abnormality or significant interval change. 2. Atherosclerosis. Electronically Signed   By: San Morelle M.D.   On: 01/04/2016 16:57   Mr Brain W Or Wo Contrast  Result Date: 01/04/2016 CLINICAL DATA:  Last seen normal at 1800 hours yesterday. Headache, altered mental status. Generalized weakness for 1-2 weeks. History of hypertension, hyperlipidemia, migraines, diabetes. EXAM: MRI HEAD WITHOUT AND WITH CONTRAST TECHNIQUE: Multiplanar, multiecho pulse sequences of the brain and surrounding structures were obtained without and with intravenous contrast. CONTRAST:  66mL MULTIHANCE GADOBENATE DIMEGLUMINE 529 MG/ML IV SOLN COMPARISON:  CT HEAD January 04, 2016 at 1641 hours and MRI head November 29, 2011 and CT HEAD February 14, 2010 FINDINGS: INTRACRANIAL CONTENTS: No reduced diffusion to suggest acute ischemia. No susceptibility artifact to suggest hemorrhage. The ventricles and sulci are normal for patient's age. Minimal white matter changes compatible chronic small vessel ischemic disease, less than expected for age. No suspicious parenchymal signal, mass effect. Stable 11 x 13 x 13 mm homogeneously bright T2 RIGHT parietal cortical based lesion completely suppresses on FLAIR sequence, without enhancement or solid component. No abnormal intraparenchymal or extra-axial enhancement. No abnormal extra-axial fluid collections. Resolution of  RIGHT parietal/parafalcine fluid collection with very  minimal hemosiderin staining. No extra-axial masses. Normal major intracranial vascular flow voids present at skull base. ORBITS: The included ocular globes and orbital contents are non-suspicious. Status post bilateral ocular lens implants. SINUSES: Small LEFT sphenoid mucosal retention cyst. Trace RIGHT mastoid effusion. SKULL/SOFT TISSUES: No abnormal sellar expansion. No suspicious calvarial bone marrow signal. Craniocervical junction maintained. IMPRESSION: No acute intracranial process. Slightly larger 11 x 11 x 13 cyst RIGHT parietal benign-appearing cyst, possibly neural glial though, recommend close attention on follow-up imaging. Resolution of RIGHT parietal extra-axial fluid collection, which was most compatible with blood products. Electronically Signed   By: Elon Alas M.D.   On: 01/04/2016 22:14   Ct Abdomen Pelvis W Contrast  Result Date: 01/06/2016 CLINICAL DATA:  Abdominal pain EXAM: CT ABDOMEN AND PELVIS WITH CONTRAST TECHNIQUE: Multidetector CT imaging of the abdomen and pelvis was performed using the standard protocol following bolus administration of intravenous contrast. CONTRAST:  42mL ISOVUE-300 IOPAMIDOL (ISOVUE-300) INJECTION 61% COMPARISON:  CT abdomen pelvis 03/20/2013 FINDINGS: Lower chest: Tiny pleural effusions bilaterally. Right middle lobe infiltrate, possible pneumonia. Mild left lower lobe atelectasis/ infiltrate. Hepatobiliary: Postop cholecystectomy. Small cyst in the left lobe of the liver unchanged. No biliary dilatation. Pancreas: Pancreatic atrophy.  No mass or edema. Spleen: Negative Adrenals/Urinary Tract: No renal mass or obstruction. No urinary tract stone. Urinary bladder empty with a Foley catheter in place. Stomach/Bowel: Negative for bowel obstruction. Constipation with extensive stool throughout the colon. Appendectomy. No bowel edema. Vascular/Lymphatic: Atherosclerotic calcification throughout the  abdominal aorta and its branches. No aneurysm. No adenopathy. Reproductive: Hysterectomy.  No pelvic mass. Other: Ventral hernia repair with mesh. 2 x 3 cm hernia along the lower margin of the mesh is unchanged from the prior study. Bowel loops extend into the hernia. No significant edema or fluid in the hernia sac. Musculoskeletal: Pedicle screw fusion T12 through L3. Multilevel degenerative change. Negative for fracture. No acute skeletal abnormality. IMPRESSION: Right middle lobe infiltrate, possible pneumonia. Chest x-ray recommended. Tiny pleural effusions bilaterally Constipation with extensive stool throughout the colon. Negative for bowel obstruction Ventral hernia recurrence the lower margin of the mesh unchanged from prior CT. Bowel loops extend into the hernia without edema or fluid in the hernia sac. Electronically Signed   By: Franchot Gallo M.D.   On: 01/06/2016 11:56   Dg Chest Port 1 View  Result Date: 01/04/2016 CLINICAL DATA:  Generalized weakness for the last 2 weeks EXAM: PORTABLE CHEST 1 VIEW COMPARISON:  03/24/2013 FINDINGS: Cardiomediastinal silhouette is stable. No acute infiltrate or pleural effusion. No pulmonary edema. Again noted postsurgical changes with metallic fixation rods and transpedicular screws lumbar spine. IMPRESSION: No active disease. Electronically Signed   By: Lahoma Crocker M.D.   On: 01/04/2016 16:38   Dg Fluoro Guide Lumbar Puncture  Result Date: 01/05/2016 CLINICAL DATA:  CT tonic patient. Headaches and shortness of breath. EXAM: DIAGNOSTIC LUMBAR PUNCTURE UNDER FLUOROSCOPIC GUIDANCE FLUOROSCOPY TIME:  Radiation Exposure Index (as provided by the fluoroscopic device): Dose area product equals 55.58 mcGy meter squared If the device does not provide the exposure index: Fluoroscopy Time (in minutes and seconds):  0 minutes 24 seconds Number of Acquired Images:  1 last image hold PROCEDURE: Informed consent was obtained from the patient prior to the procedure, including  potential complications of headache, allergy, and pain. With the patient prone, the lower back was prepped with Betadine. 1% Lidocaine was used for local anesthesia. Lumbar puncture was performed at the L4-5 level using a 20 gauge needle with  return of clear CSF with an opening pressure of 8 cm water. 8 ml of CSF were obtained for laboratory studies. The patient tolerated the procedure well and there were no apparent complications. IMPRESSION: Uncomplicated lumbar puncture performed at the L4-5 level under fluoroscopic guidance. Electronically Signed   By: Lucienne Capers M.D.   On: 01/05/2016 02:34    Lab Data:  CBC:  Recent Labs Lab 01/04/16 1600 01/04/16 1813 01/05/16 0916 01/06/16 0250 01/07/16 0323 01/08/16 0221  WBC 18.6*  --  23.0* 18.6* 18.7* 11.4*  NEUTROABS 15.3*  --   --   --   --   --   HGB 13.6 13.6 11.4* 11.0* 10.5* 10.4*  HCT 39.9 40.0 33.9* 33.9* 32.0* 31.9*  MCV 86.0  --  86.5 86.5 87.9 87.6  PLT 275  --  253 282 275 [id number]   Basic Metabolic Panel:  Recent Labs Lab 01/04/16 1600 01/04/16 1813 01/05/16 0916 01/06/16 0250 01/07/16 0323 01/07/16 1710 01/08/16 0221  NA 133* 136 136 137 138  --  139  K 3.0* 3.1* 3.4* 3.1* 3.9  --  3.4*  CL 92* 93* 101 102 102  --  103  CO2 22  --  25 26 28   --  25  GLUCOSE 180* 186* 219* 172* 180*  --  263*  BUN 21* 21* 17 15 21*  --  24*  CREATININE 1.10* 0.80 0.84 0.89 0.85  --  0.99  CALCIUM 10.4*  --  9.2 9.1 9.2  --  9.1  MG  --   --   --   --   --  1.6*  --    GFR: Estimated Creatinine Clearance: 32.5 mL/min (by C-G formula based on SCr of 0.99 mg/dL). Liver Function Tests:  Recent Labs Lab 01/04/16 1600 01/05/16 0916  AST 32 24  ALT 16 14  ALKPHOS 37* 32*  BILITOT 1.2 0.6  PROT 7.9 6.4*  ALBUMIN 4.0 3.2*    Recent Labs Lab 01/04/16 1600  LIPASE 27    Recent Labs Lab 01/04/16 1600 01/05/16 0916  AMMONIA 26 22   Coagulation Profile:  Recent Labs Lab 01/04/16 1600  INR 1.02   Cardiac  Enzymes:  Recent Labs Lab 01/04/16 1757 01/04/16 2249 01/05/16 0309 01/05/16 0916 01/05/16 1308 01/05/16 1800  CKTOTAL 60  --   --   --   --   --   TROPONINI  --  0.15* 0.16* 0.19*  0.18* 0.22* 0.24*   BNP (last 3 results) No results for input(s): PROBNP in the last 8760 hours. HbA1C: No results for input(s): HGBA1C in the last 72 hours. CBG:  Recent Labs Lab 01/04/16 2031 01/04/16 2233 01/07/16 0626  GLUCAP 182* 188* 146*   Lipid Profile: No results for input(s): CHOL, HDL, LDLCALC, TRIG, CHOLHDL, LDLDIRECT in the last 72 hours. Thyroid Function Tests: No results for input(s): TSH, T4TOTAL, FREET4, T3FREE, THYROIDAB in the last 72 hours. Anemia Panel: No results for input(s): VITAMINB12, FOLATE, FERRITIN, TIBC, IRON, RETICCTPCT in the last 72 hours. Urine analysis:    Component Value Date/Time   COLORURINE YELLOW 01/04/2016 [place] 01/04/2016 1740   LABSPEC 1.020 01/04/2016 1740   PHURINE 6.5 01/04/2016 1740   GLUCOSEU NEGATIVE 01/04/2016 1740   HGBUR NEGATIVE 01/04/2016 1740   BILIRUBINUR NEGATIVE 01/04/2016 1740   KETONESUR 15 (A) 01/04/2016 1740   PROTEINUR >300 (A) 01/04/2016 1740   UROBILINOGEN 0.2 03/20/2013 1403   NITRITE NEGATIVE 01/04/2016 1740   LEUKOCYTESUR NEGATIVE 01/04/2016  [place] M.D. [facility] 01/08/2016, 12:08 PM  Pager: [phone] Between 7am to 7pm - call Pager - [phone]  After 7pm go to [email or web address] - password [email or web address]  Call night coverage person covering after 7pm

## 2016-01-09 DIAGNOSIS — Z8659 Personal history of other mental and behavioral disorders: Secondary | ICD-10-CM | POA: Diagnosis not present

## 2016-01-09 DIAGNOSIS — I248 Other forms of acute ischemic heart disease: Secondary | ICD-10-CM | POA: Diagnosis not present

## 2016-01-09 DIAGNOSIS — K5901 Slow transit constipation: Secondary | ICD-10-CM | POA: Diagnosis not present

## 2016-01-09 DIAGNOSIS — R51 Headache: Secondary | ICD-10-CM | POA: Diagnosis not present

## 2016-01-09 DIAGNOSIS — I10 Essential (primary) hypertension: Secondary | ICD-10-CM | POA: Diagnosis not present

## 2016-01-09 LAB — CBC
HEMATOCRIT: 32.3 % — AB (ref 36.0–46.0)
Hemoglobin: 10.4 g/dL — ABNORMAL LOW (ref 12.0–15.0)
MCH: 28.1 pg (ref 26.0–34.0)
MCHC: 32.2 g/dL (ref 30.0–36.0)
MCV: 87.3 fL (ref 78.0–100.0)
PLATELETS: 268 10*3/uL (ref 150–400)
RBC: 3.7 MIL/uL — ABNORMAL LOW (ref 3.87–5.11)
RDW: 16.6 % — AB (ref 11.5–15.5)
WBC: 9.9 10*3/uL (ref 4.0–10.5)

## 2016-01-09 LAB — BASIC METABOLIC PANEL
ANION GAP: 10 (ref 5–15)
BUN: 29 mg/dL — ABNORMAL HIGH (ref 6–20)
CO2: 26 mmol/L (ref 22–32)
Calcium: 9 mg/dL (ref 8.9–10.3)
Chloride: 103 mmol/L (ref 101–111)
Creatinine, Ser: 0.9 mg/dL (ref 0.44–1.00)
GFR, EST NON AFRICAN AMERICAN: 55 mL/min — AB (ref >60)
Glucose, Bld: 179 mg/dL — ABNORMAL HIGH (ref 65–99)
Potassium: 3.8 mmol/L (ref 3.5–5.1)
Sodium: 139 mmol/L (ref 135–145)

## 2016-01-09 LAB — CULTURE, BLOOD (ROUTINE X 2)
CULTURE: NO GROWTH
CULTURE: NO GROWTH

## 2016-01-09 LAB — MAGNESIUM: MAGNESIUM: 2 mg/dL (ref 1.7–2.4)

## 2016-01-09 MED ORDER — FOLIC ACID 1 MG PO TABS
1.0000 mg | ORAL_TABLET | Freq: Two times a day (BID) | ORAL | Status: DC
  Administered 2016-01-09 – 2016-01-11 (×4): 1 mg via ORAL
  Filled 2016-01-09 (×4): qty 1

## 2016-01-09 MED ORDER — FERROUS SULFATE 325 (65 FE) MG PO TABS
325.0000 mg | ORAL_TABLET | Freq: Every day | ORAL | Status: DC
  Administered 2016-01-11: 325 mg via ORAL
  Filled 2016-01-09: qty 1

## 2016-01-09 MED ORDER — HYDROCHLOROTHIAZIDE 25 MG PO TABS
25.0000 mg | ORAL_TABLET | Freq: Every evening | ORAL | Status: DC
  Administered 2016-01-09 – 2016-01-11 (×3): 25 mg via ORAL
  Filled 2016-01-09 (×3): qty 1

## 2016-01-09 MED ORDER — ADULT MULTIVITAMIN W/MINERALS CH
1.0000 | ORAL_TABLET | Freq: Every day | ORAL | Status: DC
  Administered 2016-01-09 – 2016-01-11 (×3): 1 via ORAL
  Filled 2016-01-09 (×2): qty 1

## 2016-01-09 MED ORDER — THERA M PLUS PO TABS: 1.0000 | ORAL_TABLET | Freq: Every day | ORAL | Status: DC

## 2016-01-09 NOTE — Clinical Social Work Note (Signed)
Clinical Social Work Assessment  Patient Details  Name: FELISITY BOTKINS MRN: [id number] Date of Birth: 12-04-1925  Date of referral:  01/09/16               Reason for consult:  Facility Placement                Permission sought to share information with:  Family Supports Permission granted to share information::  Yes, Verbal Permission Granted  Name::     Gwenlyn Found  Relationship::  Daughter  Contact Information:  [phone]  Housing/Transportation Living arrangements for the past 2 months:  [place] of Information:  Adult Children (Patient Daughter in [place]) Patient Interpreter Needed:  None Criminal Activity/Legal Involvement Pertinent to Current Situation/Hospitalization:  No - Comment as needed Significant Relationships:  Adult Children, Other Family Members Lives with:  Self Do you feel safe going back to the place where you live?  Yes Need for family participation in patient care:  Yes (Comment)  Care giving concerns:  Patient daughter in law states that patient daughter, Santiago Glad, has POA and will make the decision for bed choice.  Only other additional concerns are with medication and RN to address.   Social Worker assessment / plan:  Patient daughter in law present with patient at bedside.  Patient very lethargic and did not engage in conversation.  Patient daughter in law states that patient lives at home and her granddaughter stays with her daily to assist.  Patient daughter in law also states that patient daughter, Santiago Glad is POA, however she is currently out of town.  Patient DIL also states that every time patient daughter leaves town, patient has some behavioral type disturbances.  Patient daughter in law verbalized understanding of SNF search process and is in agreement with initiating search.  Patient daughter in law recommends that CSW reach out to patient daughter and/or son to provide available bed offers.  There was some possible expressed interest in  [facility] and [place], however did do a generalized search.  CSW did make patient daughter in law aware that patient insurance may present as a potential barrier for facility of choice and she verbalized understanding.  CSW to follow up with patient daughter, Santiago Glad, to provide bed offers.  Patient will need insurance authorization prior to SNF admission.  CSW remains available for support and to facilitate patient discharge needs once medically stable.  Employment status:  Retired Nurse, adult PT Recommendations:  [place] / Referral to community resources:  [place]  Patient/Family's Response to care:  Patient with no response, however patient daughter in law verbalized understanding and appreciation for CSW support and concern.  Patient/Family's Understanding of and Emotional Response to Diagnosis, Current Treatment, and Prognosis:  Patient daughter in law states that patient is having some emotional outbursts and is need of medication management.  Patient daughter in law in medical profession and understanding of patient current needs and limitations with return home.  Emotional Assessment Appearance:  Appears stated age Attitude/Demeanor/Rapport:  Unable to Assess, Lethargic Affect (typically observed):  Unable to Assess Orientation:  Oriented to Self, Oriented to Place, Oriented to  Time Alcohol / Substance use:  Not Applicable Psych involvement (Current and /or in the community):  Yes (Comment)  Discharge Needs  Concerns to be addressed:  Compliance Issues Concerns, Medication Concerns, Decision making concerns, Discharge Planning Concerns Readmission within the last 30 days:  No Current discharge risk:  Physical  Impairment, Psychiatric Illness Barriers to Discharge:  Continued Medical Work up  [employer], [place]

## 2016-01-09 NOTE — Clinical Social Work Placement (Signed)
   CLINICAL SOCIAL WORK PLACEMENT  NOTE  Date:  01/09/2016  Patient Details  Name: Tamara Arnold MRN: [id number] Date of Birth: 1926-02-16  Clinical Social Work is seeking post-discharge placement for this patient at the [place] level of care (*CSW will initial, date and re-position this form in  chart as items are completed):  Yes   Patient/family provided with [facility] Work Department's list of facilities offering this level of care within the geographic area requested by the patient (or if unable, by the patient's family).  Yes   Patient/family informed of their freedom to choose among providers that offer the needed level of care, that participate in Medicare, Medicaid or managed care program needed by the patient, have an available bed and are willing to accept the patient.  Yes   Patient/family informed of [facility]'s ownership interest in [facility] and [facility], as well as of the fact that they are under no obligation to receive care at these facilities.  PASRR submitted to EDS on       PASRR number received on       Existing PASRR number confirmed on 01/08/16     FL2 transmitted to all facilities in geographic area requested by pt/family on 01/08/16     FL2 transmitted to all facilities within larger geographic area on       Patient informed that his/her managed care company has contracts with or will negotiate with certain facilities, including the following:            Patient/family informed of bed offers received.  Patient chooses bed at       Physician recommends and patient chooses bed at      Patient to be transferred to   on  .  Patient to be transferred to facility by       Patient family notified on   of transfer.  Name of family member notified:        PHYSICIAN Please sign FL2, Please prepare priority discharge summary, including medications, Please sign DNR, Please prepare prescriptions      Additional Comment:    [place]bette Or, [place]

## 2016-01-09 NOTE — Progress Notes (Signed)
Pt refused all meds this morning, spitting them out multiple times.  Attempted emotional support and encouragement.  Pt unreceptive, asking to left alone.  Will notify MD.

## 2016-01-09 NOTE — Progress Notes (Signed)
[facility]                                                                              Patient Demographics  Tamara Arnold, is a 82 y.o. female, DOB - 1925-09-18, [id number]  Admit date - 01/04/2016   Admitting Physician Karmen Bongo, MD  Outpatient Primary MD for the patient is Stephens Shire, MD  Outpatient specialists:   LOS - 5  days    Chief Complaint  Patient presents with  . Altered Mental Status       Brief summary   Patient is a 82 year old female with chronic headaches, some paranoia/psychiatry can illness, dementia presented with acute encephalopathy. She did have one episode of vomiting prior to admission. Per admission history, was not responding appropriately, no talking with glazed look and staring. Family brought patient to ED for further workup.   Assessment & Plan    Principal Problem: Acute encephalopathy: Unclear cause, Waxing and waning mental status with psychiatric illness, depression, dementia - Today patient is alert and oriented 3 and conversing normally. Appreciate psych recommendations. Started on Trileptal 150 mg twice a day for headache and mood, restart Seroquel 25 mg at bedtime paranoia -CT head, MRI negative for any CVA - UA negative for UTI, LP done, studies negative for any meningitis. Will add HSV PCR to the studies. - Chest x-ray negative for any pneumonia, for now continue IV vancomycin and Zosyn - EEG consistent with possible mild encephalopathy, no seizure -B-1 still pending, ceruloplasmin, B12, folate normal  - ammonia level normal  Lactic acidosis, leukocytosis, right lung pneumonia - CT abdomen showed right lung pneumonia, continue IV antibiotics - CSF cultures negative - blood cultures negative, sepsis ruled out  - Discontinued vancomycin, for now continue Zosyn, will transition to oral Augmentin in a.m.   Headache with h/o paranoid/delusional thinking -Patient was last seen by neurology (Dr. Jannifer Franklin)  in March 2017.  She was showing evidence of paranoid/delusional thinking at that time and also had a score of 22/30 on the MMSE suggesting mild dementia.  HTN uncontrolled with SVTs -Restarted outpatient antihypertensives, Continue hydralazine. - Appreciate cardiology recommendations, continue metoprolol, diltiazem for SVTs. - On lisinopril, hydralazine  Constipation - CT abdomen also showed significant stool, placed on stool softeners, had large BM today -Family reports long-standing constipation, continue Dulcolax suppository as needed  Elevated troponin - Likely due to #1, sepsis physiology - 2-D echo showed EF of 60-65% with grade 2 diastolic dysfunction currently no complaint of chest pain   Code Status:DO NOT RESUSCITATE  DVT Prophylaxis:  Lovenox  Family Communication:  Disposition Plan: Will likely need skilled nursing facility, likely DC in a.m. if bed available Time Spent in minutes 25 minutes  Procedures:  MRI brain LP   Consultants:   None   Antimicrobials:   IV vanc  IV zosyn   Medications  Scheduled Meds: . aspirin  300 mg Rectal Daily   Or  . aspirin  325 mg Oral Daily  . diltiazem  180 mg Oral Daily  . enoxaparin (LOVENOX) injection  40 mg Subcutaneous QHS  . hydrALAZINE  50 mg Oral QID  . lisinopril  40 mg Oral Daily  . metoprolol tartrate  100 mg Oral BID  . multivitamin  1 tablet Oral Daily  . OXcarbazepine  150 mg Oral BID  . piperacillin-tazobactam (ZOSYN)  IV  3.375 g Intravenous Q8H  . polyethylene glycol  34 g Oral Daily  . QUEtiapine  25 mg Oral QHS  . vancomycin  1,000 mg Intravenous Q24H   Continuous Infusions:   PRN Meds:.acetaminophen **OR** acetaminophen, hydrALAZINE, labetalol, LORazepam, metoprolol, morphine injection, ondansetron (ZOFRAN) IV, senna-docusate, traMADol   Antibiotics   Anti-infectives    Start     Dose/Rate Route Frequency Ordered Stop   01/05/16 2200  vancomycin (VANCOCIN) IVPB 1000 mg/200 mL premix      1,000 mg 200 mL/hr over 60 Minutes Intravenous Every 24 hours 01/04/16 2240     01/05/16 0600  piperacillin-tazobactam (ZOSYN) IVPB 3.375 g     3.375 g 12.5 mL/hr over 240 Minutes Intravenous Every 8 hours 01/04/16 2240     01/04/16 2245  vancomycin (VANCOCIN) 1,250 mg in sodium chloride 0.9 % 250 mL IVPB     1,250 mg 166.7 mL/hr over 90 Minutes Intravenous  Once 01/04/16 2240 01/05/16 0326   01/04/16 2245  piperacillin-tazobactam (ZOSYN) IVPB 3.375 g     3.375 g 100 mL/hr over 30 Minutes Intravenous  Once 01/04/16 2240 01/05/16 0232        Subjective:   [name] was seen and examined today. Alert and oriented, conversing normally. Denies any specific complaints. Feeling better today. No fevers. Denies any chest pain, shortness of breath, nausea, vomiting, abdominal pain. No fevers. BP still uncontrolled   Objective:   Vitals:   01/08/16 0153 01/08/16 0711 01/08/16 1940 01/09/16 0355  BP: (!) 150/64 (!) 156/61 (!) 164/61 (!) 160/49  Pulse: (!) 106 96 89 77  Resp: 18 18 18 18   Temp: 98.6 F (37 C) 98.9 F (37.2 C) 98.8 F (37.1 C) 97.8 F (36.6 C)  TempSrc: Oral Oral Axillary Oral  SpO2: 99% 98% 99% 100%  Weight:  56.6 kg (124 lb 11.2 oz)    Height:        Intake/Output Summary (Last 24 hours) at 01/09/16 1000 Last data filed at 01/09/16 [id number]  Gross per 24 hour  Intake                0 ml  Output              752 ml  Net             -752 ml     Wt Readings from Last 3 Encounters:  01/08/16 56.6 kg (124 lb 11.2 oz)  07/22/15 58.1 kg (128 lb)  12/01/13 64.4 kg (142 lb)     Exam  General: Alert and oriented, NAD   HEENT:    Neck:  Cardiovascular: S1 S2 clear, RRR  Respiratory: Clear to auscultation bilaterally anteriorly  Gastrointestinal: Soft, nontender, nondistended, + bowel sounds  Ext: no cyanosis clubbing or edema  Neuro: no new deficits  Skin: No rashes  Psych: alert and oriented today  Data Reviewed:  I have personally reviewed  following labs and imaging studies  Micro Results Recent Results (from the past 240 hour(s))  Culture, blood (routine x 2)     Status: None (Preliminary result)   Collection Time: 01/04/16  4:00 PM  Result Value Ref Range Status   Specimen Description BLOOD LEFT HAND  Final   Special Requests BOTTLES DRAWN AEROBIC AND ANAEROBIC 5CC  Final  Culture NO GROWTH 4 DAYS  Final   Report Status PENDING  Incomplete  Culture, blood (routine x 2)     Status: None (Preliminary result)   Collection Time: 01/04/16  4:24 PM  Result Value Ref Range Status   Specimen Description BLOOD RIGHT HAND  Final   Special Requests BOTTLES DRAWN AEROBIC AND ANAEROBIC 5CC  Final   Culture NO GROWTH 4 DAYS  Final   Report Status PENDING  Incomplete  Urine culture     Status: None   Collection Time: 01/04/16  5:41 PM  Result Value Ref Range Status   Specimen Description URINE, RANDOM  Final   Special Requests NONE  Final   Culture NO GROWTH  Final   Report Status 01/05/2016 FINAL  Final  CSF culture with Stat gram stain     Status: None   Collection Time: 01/05/16  1:00 AM  Result Value Ref Range Status   Specimen Description CSF  Final   Special Requests Normal  Final   Gram Stain   Final    CYTOSPIN SMEAR WBC PRESENT,BOTH PMN AND MONONUCLEAR NO ORGANISMS SEEN    Culture NO GROWTH 3 DAYS  Final   Report Status 01/08/2016 FINAL  Final  MRSA PCR Screening     Status: None   Collection Time: 01/08/16  1:08 PM  Result Value Ref Range Status   MRSA by PCR NEGATIVE NEGATIVE Final    Comment:        The GeneXpert MRSA Assay (FDA approved for NASAL specimens only), is one component of a comprehensive MRSA colonization surveillance program. It is not intended to diagnose MRSA infection nor to guide or monitor treatment for MRSA infections.     Radiology Reports Ct Head Wo Contrast  Result Date: 01/04/2016 CLINICAL DATA:  Altered mental status. Generalized weakness over the last 1-2 weeks. EXAM: CT  HEAD WITHOUT CONTRAST TECHNIQUE: Contiguous axial images were obtained from the base of the skull through the vertex without intravenous contrast. COMPARISON:  CT head without contrast 05/27/2015. FINDINGS: Brain: A tiny arachnoid cyst is again noted near the vertex on the right. No acute infarct, hemorrhage, or mass lesion is present. Minimal atrophy and white matter disease is within normal limits for age. The basal ganglia and insular ribbon are normal. No focal cortical lesions are present. Vascular: Atherosclerotic calcifications are present within the cavernous internal carotid arteries and at the dural margin of the vertebral arteries bilaterally. Skull: The calvarium is intact. Sinuses/Orbits: A polyp or mucous retention cyst within the right sphenoid sinus is stable. The remaining paranasal sinuses and the mastoid air cells are clear. The patient is status post bilateral lens replacements. The globes and orbits are otherwise intact. Other: No significant extracranial soft tissue lesion is present. IMPRESSION: 1. No acute intracranial abnormality or significant interval change. 2. Atherosclerosis. Electronically Signed   By: San Morelle M.D.   On: 01/04/2016 16:57   Mr Brain W Or Wo Contrast  Result Date: 01/04/2016 CLINICAL DATA:  Last seen normal at 1800 hours yesterday. Headache, altered mental status. Generalized weakness for 1-2 weeks. History of hypertension, hyperlipidemia, migraines, diabetes. EXAM: MRI HEAD WITHOUT AND WITH CONTRAST TECHNIQUE: Multiplanar, multiecho pulse sequences of the brain and surrounding structures were obtained without and with intravenous contrast. CONTRAST:  25mL MULTIHANCE GADOBENATE DIMEGLUMINE 529 MG/ML IV SOLN COMPARISON:  CT HEAD January 04, 2016 at 1641 hours and MRI head November 29, 2011 and CT HEAD February 14, 2010 FINDINGS: INTRACRANIAL CONTENTS: No reduced diffusion  to suggest acute ischemia. No susceptibility artifact to suggest hemorrhage. The  ventricles and sulci are normal for patient's age. Minimal white matter changes compatible chronic small vessel ischemic disease, less than expected for age. No suspicious parenchymal signal, mass effect. Stable 11 x 13 x 13 mm homogeneously bright T2 RIGHT parietal cortical based lesion completely suppresses on FLAIR sequence, without enhancement or solid component. No abnormal intraparenchymal or extra-axial enhancement. No abnormal extra-axial fluid collections. Resolution of RIGHT parietal/parafalcine fluid collection with very minimal hemosiderin staining. No extra-axial masses. Normal major intracranial vascular flow voids present at skull base. ORBITS: The included ocular globes and orbital contents are non-suspicious. Status post bilateral ocular lens implants. SINUSES: Small LEFT sphenoid mucosal retention cyst. Trace RIGHT mastoid effusion. SKULL/SOFT TISSUES: No abnormal sellar expansion. No suspicious calvarial bone marrow signal. Craniocervical junction maintained. IMPRESSION: No acute intracranial process. Slightly larger 11 x 11 x 13 cyst RIGHT parietal benign-appearing cyst, possibly neural glial though, recommend close attention on follow-up imaging. Resolution of RIGHT parietal extra-axial fluid collection, which was most compatible with blood products. Electronically Signed   By: Elon Alas M.D.   On: 01/04/2016 22:14   Ct Abdomen Pelvis W Contrast  Result Date: 01/06/2016 CLINICAL DATA:  Abdominal pain EXAM: CT ABDOMEN AND PELVIS WITH CONTRAST TECHNIQUE: Multidetector CT imaging of the abdomen and pelvis was performed using the standard protocol following bolus administration of intravenous contrast. CONTRAST:  35mL ISOVUE-300 IOPAMIDOL (ISOVUE-300) INJECTION 61% COMPARISON:  CT abdomen pelvis 03/20/2013 FINDINGS: Lower chest: Tiny pleural effusions bilaterally. Right middle lobe infiltrate, possible pneumonia. Mild left lower lobe atelectasis/ infiltrate. Hepatobiliary: Postop  cholecystectomy. Small cyst in the left lobe of the liver unchanged. No biliary dilatation. Pancreas: Pancreatic atrophy.  No mass or edema. Spleen: Negative Adrenals/Urinary Tract: No renal mass or obstruction. No urinary tract stone. Urinary bladder empty with a Foley catheter in place. Stomach/Bowel: Negative for bowel obstruction. Constipation with extensive stool throughout the colon. Appendectomy. No bowel edema. Vascular/Lymphatic: Atherosclerotic calcification throughout the abdominal aorta and its branches. No aneurysm. No adenopathy. Reproductive: Hysterectomy.  No pelvic mass. Other: Ventral hernia repair with mesh. 2 x 3 cm hernia along the lower margin of the mesh is unchanged from the prior study. Bowel loops extend into the hernia. No significant edema or fluid in the hernia sac. Musculoskeletal: Pedicle screw fusion T12 through L3. Multilevel degenerative change. Negative for fracture. No acute skeletal abnormality. IMPRESSION: Right middle lobe infiltrate, possible pneumonia. Chest x-ray recommended. Tiny pleural effusions bilaterally Constipation with extensive stool throughout the colon. Negative for bowel obstruction Ventral hernia recurrence the lower margin of the mesh unchanged from prior CT. Bowel loops extend into the hernia without edema or fluid in the hernia sac. Electronically Signed   By: Franchot Gallo M.D.   On: 01/06/2016 11:56   Dg Chest Port 1 View  Result Date: 01/04/2016 CLINICAL DATA:  Generalized weakness for the last 2 weeks EXAM: PORTABLE CHEST 1 VIEW COMPARISON:  03/24/2013 FINDINGS: Cardiomediastinal silhouette is stable. No acute infiltrate or pleural effusion. No pulmonary edema. Again noted postsurgical changes with metallic fixation rods and transpedicular screws lumbar spine. IMPRESSION: No active disease. Electronically Signed   By: Lahoma Crocker M.D.   On: 01/04/2016 16:38   Dg Fluoro Guide Lumbar Puncture  Result Date: 01/05/2016 CLINICAL DATA:  CT tonic  patient. Headaches and shortness of breath. EXAM: DIAGNOSTIC LUMBAR PUNCTURE UNDER FLUOROSCOPIC GUIDANCE FLUOROSCOPY TIME:  Radiation Exposure Index (as provided by the fluoroscopic device): Dose area product equals  55.58 mcGy meter squared If the device does not provide the exposure index: Fluoroscopy Time (in minutes and seconds):  0 minutes 24 seconds Number of Acquired Images:  1 last image hold PROCEDURE: Informed consent was obtained from the patient prior to the procedure, including potential complications of headache, allergy, and pain. With the patient prone, the lower back was prepped with Betadine. 1% Lidocaine was used for local anesthesia. Lumbar puncture was performed at the L4-5 level using a 20 gauge needle with return of clear CSF with an opening pressure of 8 cm water. 8 ml of CSF were obtained for laboratory studies. The patient tolerated the procedure well and there were no apparent complications. IMPRESSION: Uncomplicated lumbar puncture performed at the L4-5 level under fluoroscopic guidance. Electronically Signed   By: Lucienne Capers M.D.   On: 01/05/2016 02:34    Lab Data:  CBC:  Recent Labs Lab 01/04/16 1600  01/05/16 0916 01/06/16 0250 01/07/16 0323 01/08/16 0221 01/09/16 0319  WBC 18.6*  --  23.0* 18.6* 18.7* 11.4* 9.9  NEUTROABS 15.3*  --   --   --   --   --   --   HGB 13.6  < > 11.4* 11.0* 10.5* 10.4* 10.4*  HCT 39.9  < > 33.9* 33.9* 32.0* 31.9* 32.3*  MCV 86.0  --  86.5 86.5 87.9 87.6 87.3  PLT 275  --  253 282 275 274 268  < > = values in this interval not displayed. Basic Metabolic Panel:  Recent Labs Lab 01/05/16 0916 01/06/16 0250 01/07/16 0323 01/07/16 1710 01/08/16 0221 01/09/16 0319  NA 136 137 138  --  139 139  K 3.4* 3.1* 3.9  --  3.4* 3.8  CL 101 102 102  --  103 103  CO2 25 26 28   --  25 26  GLUCOSE 219* 172* 180*  --  263* 179*  BUN 17 15 21*  --  24* 29*  CREATININE 0.84 0.89 0.85  --  0.99 0.90  CALCIUM 9.2 9.1 9.2  --  9.1 9.0  MG   --   --   --  1.6*  --  2.0   GFR: Estimated Creatinine Clearance: 35.7 mL/min (by C-G formula based on SCr of 0.9 mg/dL). Liver Function Tests:  Recent Labs Lab 01/04/16 1600 01/05/16 0916  AST 32 24  ALT 16 14  ALKPHOS 37* 32*  BILITOT 1.2 0.6  PROT 7.9 6.4*  ALBUMIN 4.0 3.2*    Recent Labs Lab 01/04/16 1600  LIPASE 27    Recent Labs Lab 01/04/16 1600 01/05/16 0916  AMMONIA 26 22   Coagulation Profile:  Recent Labs Lab 01/04/16 1600  INR 1.02   Cardiac Enzymes:  Recent Labs Lab 01/04/16 1757 01/04/16 2249 01/05/16 0309 01/05/16 0916 01/05/16 1308 01/05/16 1800  CKTOTAL 60  --   --   --   --   --   TROPONINI  --  0.15* 0.16* 0.19*  0.18* 0.22* 0.24*   BNP (last 3 results) No results for input(s): PROBNP in the last 8760 hours. HbA1C: No results for input(s): HGBA1C in the last 72 hours. CBG:  Recent Labs Lab 01/04/16 2031 01/04/16 2233 01/07/16 0626  GLUCAP 182* 188* 146*   Lipid Profile: No results for input(s): CHOL, HDL, LDLCALC, TRIG, CHOLHDL, LDLDIRECT in the last 72 hours. Thyroid Function Tests: No results for input(s): TSH, T4TOTAL, FREET4, T3FREE, THYROIDAB in the last 72 hours. Anemia Panel: No results for input(s): VITAMINB12, FOLATE, FERRITIN, TIBC, IRON,  RETICCTPCT in the last 72 hours. Urine analysis:    Component Value Date/Time   COLORURINE YELLOW 01/04/2016 1740   APPEARANCEUR CLEAR 01/04/2016 1740   LABSPEC 1.020 01/04/2016 1740   PHURINE 6.5 01/04/2016 1740   GLUCOSEU NEGATIVE 01/04/2016 1740   HGBUR NEGATIVE 01/04/2016 1740   BILIRUBINUR NEGATIVE 01/04/2016 1740   KETONESUR 15 (A) 01/04/2016 1740   PROTEINUR >300 (A) 01/04/2016 1740   UROBILINOGEN 0.2 03/20/2013 1403   NITRITE NEGATIVE 01/04/2016 1740   LEUKOCYTESUR NEGATIVE 01/04/2016 1740     Najae Filsaime M.D. [facility] 01/09/2016, 10:00 AM  Pager: [phone] Between 7am to 7pm - call Pager - [phone]  After 7pm go to [email or web address] -  password [email or web address]  Call night coverage person covering after 7pm

## 2016-01-10 DIAGNOSIS — R51 Headache: Secondary | ICD-10-CM | POA: Diagnosis not present

## 2016-01-10 DIAGNOSIS — Z8659 Personal history of other mental and behavioral disorders: Secondary | ICD-10-CM | POA: Diagnosis not present

## 2016-01-10 DIAGNOSIS — I10 Essential (primary) hypertension: Secondary | ICD-10-CM | POA: Diagnosis not present

## 2016-01-10 DIAGNOSIS — I248 Other forms of acute ischemic heart disease: Secondary | ICD-10-CM | POA: Diagnosis not present

## 2016-01-10 DIAGNOSIS — K5901 Slow transit constipation: Secondary | ICD-10-CM | POA: Diagnosis not present

## 2016-01-10 LAB — VITAMIN B1: VITAMIN B1 (THIAMINE): 236.5 nmol/L — AB (ref 66.5–200.0)

## 2016-01-10 MED ORDER — AMOXICILLIN-POT CLAVULANATE 500-125 MG PO TABS
1.0000 | ORAL_TABLET | Freq: Two times a day (BID) | ORAL | Status: DC
  Administered 2016-01-10 – 2016-01-11 (×2): 500 mg via ORAL
  Filled 2016-01-10 (×4): qty 1

## 2016-01-10 MED ORDER — AMOXICILLIN-POT CLAVULANATE 875-125 MG PO TABS: 1.0000 | ORAL_TABLET | Freq: Two times a day (BID) | ORAL | Status: DC

## 2016-01-10 NOTE — Progress Notes (Signed)
Physical Therapy Treatment Patient Details Name: Tamara Arnold MRN: [id number] DOB: March 14, 1926 Today's Date: 01/10/2016    History of Present Illness 82 y.o.femalewith medical history significant of chronic severe headaches and some paranoia/psychiatric illness in her later years but overall clear mental status despite her age presenting in a catatonic state. MRI negative for acute event.  Dx HTN; followed by Dr. Eugenie Birks (March 2017) with dx mild dementia, paranoid/delusional thinking, stricture of the cervical esophagus    PT Comments    Pt pleasant but anxious on arrival. Incontinent of BM with assist for pericare prior to mobility. Pt apprehensive about mobility and responded well to encouragement and reassurance. Pt able to transfer and ambulate today with cues and assist. Pt with HR 115 and sats 95% on RA. Will continue to follow.  Follow Up Recommendations  SNF;Supervision/Assistance - 24 hour     Equipment Recommendations       Recommendations for Other Services       Precautions / Restrictions Precautions Precautions: Fall Precaution Comments: incontinent BM    Mobility  Bed Mobility Overal bed mobility: Needs Assistance Bed Mobility: Rolling;Sidelying to Sit Rolling: Min guard Sidelying to sit: Min assist       General bed mobility comments: cues for sequence with pt able to use rail to roll bil x 2 trials, Cues and encouragement for transition to sitting with assist to initiate lower and upper body movement  Transfers Overall transfer level: Needs assistance   Transfers: Sit to/from Stand Sit to Stand: Min assist         General transfer comment: cues for hand placement, anterior translation and sequence  Ambulation/Gait Ambulation/Gait assistance: Min guard;+2 safety/equipment Ambulation Distance (Feet): 15 Feet Assistive device: Rolling walker (2 wheeled) Gait Pattern/deviations: Step-through pattern;Decreased stride length;Trunk flexed   Gait  velocity interpretation: Below normal speed for age/gender General Gait Details: cues for position in RW and posture as well as encouragement to maximize distance and chair to follow for safety   Stairs            Wheelchair Mobility    Modified Rankin (Stroke Patients Only)       Balance Overall balance assessment: Needs assistance   Sitting balance-Leahy Scale: Fair       Standing balance-Leahy Scale: Poor                      Cognition Arousal/Alertness: Awake/alert Behavior During Therapy: Flat affect Overall Cognitive Status: No family/caregiver present to determine baseline cognitive functioning                      Exercises General Exercises - Lower Extremity Ankle Circles/Pumps: AROM;Seated;Both;10 reps Long Arc Quad: AROM;Seated;Both;10 reps Hip ABduction/ADduction: AROM;Seated;Both;10 reps Hip Flexion/Marching: AROM;Seated;Both;10 reps    General Comments        Pertinent Vitals/Pain Pain Assessment: No/denies pain    Home Living                      Prior Function            PT Goals (current goals can now be found in the care plan section) Progress towards PT goals: Progressing toward goals    Frequency       PT Plan Current plan remains appropriate    Co-evaluation             End of Session Equipment Utilized During Treatment: Gait belt Activity Tolerance: Patient tolerated treatment well  Patient left: in chair;with call bell/phone within reach;with chair alarm set     Time: [id number] PT Time Calculation (min) (ACUTE ONLY): 24 min  Charges:  $Gait Training: 8-22 mins $Therapeutic Activity: 8-22 mins                    G Codes:      Melford Aase 05-Feb-2016, 12:37 PM Elwyn Reach, [place]

## 2016-01-10 NOTE — Progress Notes (Signed)
Pt refused all of her meds for the night. Per pt. She is tired and want to rest. Pt had several SVT over the night. Txtpaged on call [facility]. No new orders at this time. Pt HR and BP maintained with Labetalol PRN. Will continue to monitor closely.

## 2016-01-10 NOTE — Care Management Important Message (Signed)
Important Message  Patient Details  Name: Tamara Arnold MRN: [id number] Date of Birth: May 27, 1925   Medicare Important Message Given:  Yes    Tye Vigo Abena 01/10/2016, 11:09 AM

## 2016-01-10 NOTE — Progress Notes (Signed)
Speech Language Pathology Treatment: Dysphagia  Patient Details Name: Tamara Arnold MRN: [id number] DOB: May 15, 1926 Today's Date: 01/10/2016 Time: [id number] SLP Time Calculation (min) (ACUTE ONLY): 8 min  Assessment / Plan / Recommendation Clinical Impression  Pt demonstrates adequate airway protection.  She is alert and communicative, but with intermittent confusion.  Recommend advancing diet to soft mechanical given absent dentition; continue crushing meds due to decreased patency upper esophagus.  Mod I with precautions.  No SLP f/u is necessary - our services will sign off.    HPI HPI: 82 y.o.femalewith medical history significant of chronic severe headaches and some paranoia/psychiatric illness in her later years but overall clear mental status despite her age presenting in a catatonic state. MRI negative for acute event.  Dx HTN; followed by Dr. Eugenie Birks (March 2017) with dx mild dementia, paranoid/delusional thinking.   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 Minimum diameter is approximately 8 mm.       SLP Plan  All goals met     Recommendations  Diet recommendations: Dysphagia 3 (mechanical soft);Thin liquid Liquids provided via: Cup;Straw Medication Administration: Crushed with puree Supervision: Patient able to self feed;Staff to assist with self feeding Compensations: Slow rate;Small sips/bites             Oral Care Recommendations: Oral care BID Follow up Recommendations: None Plan: All goals met     GO                Tamara Arnold 01/10/2016, 10:10 AM

## 2016-01-10 NOTE — Progress Notes (Signed)
[facility]                                                                              Patient Demographics  Tamara Arnold, is a 82 y.o. female, DOB - March 29, 1926, [id number]  Admit date - 01/04/2016   Admitting Physician Karmen Bongo, MD  Outpatient Primary MD for the patient is Stephens Shire, MD  Outpatient specialists:   LOS - 6  days    Chief Complaint  Patient presents with  . Altered Mental Status       Brief summary   Patient is a 82 year old female with chronic headaches, some paranoia/psychiatry can illness, dementia presented with acute encephalopathy. She did have one episode of vomiting prior to admission. Per admission history, was not responding appropriately, no talking with glazed look and staring. Family brought patient to ED for further workup.   Assessment & Plan    Principal Problem: Acute encephalopathy: Unclear cause, Waxing and waning mental status with psychiatric illness, depression, dementia - Today patient is alert and oriented 3 and conversing normally.Family at the bedside. -  Appreciate psych recommendations. Started on Trileptal 150 mg twice a day for headache and mood, restart Seroquel 25 mg at bedtime paranoia -CT head, MRI negative for any CVA - UA negative for UTI, LP done, studies negative for any bacterial meningitis. HSV slightly positive at 1.29 which may indicate current or past HSV infection. Patient is now back to normal baseline, alert and oriented x3.  - Chest x-ray negative for any pneumonia, for now continue IV vancomycin and Zosyn - EEG consistent with possible mild encephalopathy, no seizure -B-1 normal, ceruloplasmin, B12, folate normal  - ammonia level normal  Lactic acidosis, leukocytosis, right lung pneumonia - CT abdomen showed right lung pneumonia, continue IV antibiotics - CSF cultures negative - blood cultures negative, sepsis ruled out  - Discontinued vancomycin, , DC Zosyn, placed on oral  Augmentin   Headache with h/o paranoid/delusional thinking -Patient was last seen by neurology (Dr. Jannifer Franklin) in March 2017.  She was showing evidence of paranoid/delusional thinking at that time and also had a score of 22/30 on the MMSE suggesting mild dementia.  HTN uncontrolled with SVTs -Restarted outpatient antihypertensives, Continue hydralazine. - Appreciate cardiology recommendations, continue metoprolol, diltiazem for SVTs. - On lisinopril, hydralazine  Constipation - CT abdomen also showed significant stool, placed on stool softeners -Family reports long-standing constipation, continue Dulcolax suppository as needed  Elevated troponin - Likely due to #1, sepsis physiology - 2-D echo showed EF of 60-65% with grade 2 diastolic dysfunction currently no complaint of chest pain   Code Status:DO NOT RESUSCITATE  DVT Prophylaxis:  Lovenox  Family Communication: Discussed in detail with patient's granddaughter and son, Tamara Arnold at the bedside. Family will look at the facility's today and make decision regarding skilled nursing facility. Patient's son agrees that patient will not be able to stay at home alone anymore. Disposition Plan: Will likely need skilled nursing facility, likely DC in a.m. if bed available Time Spent in minutes 25 minutes  Procedures:  MRI brain LP   Consultants:   None   Antimicrobials:   IV vanc  IV zosyn   Medications  Scheduled Meds: . aspirin  300 mg Rectal Daily   Or  . aspirin  325 mg Oral Daily  . diltiazem  180 mg Oral Daily  . enoxaparin (LOVENOX) injection  40 mg Subcutaneous QHS  . ferrous sulfate  325 mg Oral Q1200  . folic acid  1 mg Oral BID  . hydrALAZINE  50 mg Oral QID  . hydrochlorothiazide  25 mg Oral QPM  . lisinopril  40 mg Oral Daily  . metoprolol tartrate  100 mg Oral BID  . multivitamin  1 tablet Oral Daily  . multivitamin with minerals  1 tablet Oral Daily  . OXcarbazepine  150 mg Oral BID  .  piperacillin-tazobactam (ZOSYN)  IV  3.375 g Intravenous Q8H  . polyethylene glycol  34 g Oral Daily  . QUEtiapine  25 mg Oral QHS   Continuous Infusions:   PRN Meds:.acetaminophen **OR** acetaminophen, hydrALAZINE, labetalol, LORazepam, metoprolol, morphine injection, ondansetron (ZOFRAN) IV, senna-docusate, traMADol   Antibiotics   Anti-infectives    Start     Dose/Rate Route Frequency Ordered Stop   01/05/16 2200  vancomycin (VANCOCIN) IVPB 1000 mg/200 mL premix  Status:  Discontinued     1,000 mg 200 mL/hr over 60 Minutes Intravenous Every 24 hours 01/04/16 2240 01/09/16 1001   01/05/16 0600  piperacillin-tazobactam (ZOSYN) IVPB 3.375 g     3.375 g 12.5 mL/hr over 240 Minutes Intravenous Every 8 hours 01/04/16 2240     01/04/16 2245  vancomycin (VANCOCIN) 1,250 mg in sodium chloride 0.9 % 250 mL IVPB     1,250 mg 166.7 mL/hr over 90 Minutes Intravenous  Once 01/04/16 2240 01/05/16 0326   01/04/16 2245  piperacillin-tazobactam (ZOSYN) IVPB 3.375 g     3.375 g 100 mL/hr over 30 Minutes Intravenous  Once 01/04/16 2240 01/05/16 0232        Subjective:   Tamara Arnold was seen and examined today. Alert and oriented, conversing normally. Family members at the bedside.  No fevers. Denies any chest pain, shortness of breath, nausea, vomiting, abdominal pain. No fevers. BP still uncontrolled   Objective:   Vitals:   01/09/16 2133 01/10/16 0019 01/10/16 0522 01/10/16 0615  BP: (!) 176/54 (!) 140/92 (!) 184/60 (!) 166/58  Pulse: 86  (!) 101 85  Resp: 18  18   Temp: 98.2 F (36.8 C)  98.2 F (36.8 C)   TempSrc: Oral  Oral   SpO2: 100%  100%   Weight:      Height:        Intake/Output Summary (Last 24 hours) at 01/10/16 1107 Last data filed at 01/10/16 0524  Gross per 24 hour  Intake                0 ml  Output              836 ml  Net             -836 ml     Wt Readings from Last 3 Encounters:  01/08/16 56.6 kg (124 lb 11.2 oz)  07/22/15 58.1 kg (128 lb)    12/01/13 64.4 kg (142 lb)     Exam  General: Alert and oriented, NAD , Back to baseline  HEENT:    Neck:  Cardiovascular: S1 S2 clear, RRR  Respiratory: CTAB  Gastrointestinal: Soft, nontender, nondistended, + bowel sounds  Ext: no cyanosis clubbing or edema  Neuro: no new deficits  Skin: No rashes  Psych:  alert and oriented today  Data Reviewed:  I have personally reviewed following labs and imaging studies  Micro Results Recent Results (from the past 240 hour(s))  Culture, blood (routine x 2)     Status: None   Collection Time: 01/04/16  4:00 PM  Result Value Ref Range Status   Specimen Description BLOOD LEFT HAND  Final   Special Requests BOTTLES DRAWN AEROBIC AND ANAEROBIC 5CC  Final   Culture NO GROWTH 5 DAYS  Final   Report Status 01/09/2016 FINAL  Final  Culture, blood (routine x 2)     Status: None   Collection Time: 01/04/16  4:24 PM  Result Value Ref Range Status   Specimen Description BLOOD RIGHT HAND  Final   Special Requests BOTTLES DRAWN AEROBIC AND ANAEROBIC 5CC  Final   Culture NO GROWTH 5 DAYS  Final   Report Status 01/09/2016 FINAL  Final  Urine culture     Status: None   Collection Time: 01/04/16  5:41 PM  Result Value Ref Range Status   Specimen Description URINE, RANDOM  Final   Special Requests NONE  Final   Culture NO GROWTH  Final   Report Status 01/05/2016 FINAL  Final  CSF culture with Stat gram stain     Status: None   Collection Time: 01/05/16  1:00 AM  Result Value Ref Range Status   Specimen Description CSF  Final   Special Requests Normal  Final   Gram Stain   Final    CYTOSPIN SMEAR WBC PRESENT,BOTH PMN AND MONONUCLEAR NO ORGANISMS SEEN    Culture NO GROWTH 3 DAYS  Final   Report Status 01/08/2016 FINAL  Final  MRSA PCR Screening     Status: None   Collection Time: 01/08/16  1:08 PM  Result Value Ref Range Status   MRSA by PCR NEGATIVE NEGATIVE Final    Comment:        The GeneXpert MRSA Assay (FDA approved for  NASAL specimens only), is one component of a comprehensive MRSA colonization surveillance program. It is not intended to diagnose MRSA infection nor to guide or monitor treatment for MRSA infections.     Radiology Reports Ct Head Wo Contrast  Result Date: 01/04/2016 CLINICAL DATA:  Altered mental status. Generalized weakness over the last 1-2 weeks. EXAM: CT HEAD WITHOUT CONTRAST TECHNIQUE: Contiguous axial images were obtained from the base of the skull through the vertex without intravenous contrast. COMPARISON:  CT head without contrast 05/27/2015. FINDINGS: Brain: A tiny arachnoid cyst is again noted near the vertex on the right. No acute infarct, hemorrhage, or mass lesion is present. Minimal atrophy and white matter disease is within normal limits for age. The basal ganglia and insular ribbon are normal. No focal cortical lesions are present. Vascular: Atherosclerotic calcifications are present within the cavernous internal carotid arteries and at the dural margin of the vertebral arteries bilaterally. Skull: The calvarium is intact. Sinuses/Orbits: A polyp or mucous retention cyst within the right sphenoid sinus is stable. The remaining paranasal sinuses and the mastoid air cells are clear. The patient is status post bilateral lens replacements. The globes and orbits are otherwise intact. Other: No significant extracranial soft tissue lesion is present. IMPRESSION: 1. No acute intracranial abnormality or significant interval change. 2. Atherosclerosis. Electronically Signed   By: San Morelle M.D.   On: 01/04/2016 16:57   Mr Brain W Or Wo Contrast  Result Date: 01/04/2016 CLINICAL DATA:  Last seen normal at 1800 hours yesterday. Headache, altered  mental status. Generalized weakness for 1-2 weeks. History of hypertension, hyperlipidemia, migraines, diabetes. EXAM: MRI HEAD WITHOUT AND WITH CONTRAST TECHNIQUE: Multiplanar, multiecho pulse sequences of the brain and surrounding  structures were obtained without and with intravenous contrast. CONTRAST:  52mL MULTIHANCE GADOBENATE DIMEGLUMINE 529 MG/ML IV SOLN COMPARISON:  CT HEAD January 04, 2016 at 1641 hours and MRI head November 29, 2011 and CT HEAD February 14, 2010 FINDINGS: INTRACRANIAL CONTENTS: No reduced diffusion to suggest acute ischemia. No susceptibility artifact to suggest hemorrhage. The ventricles and sulci are normal for patient's age. Minimal white matter changes compatible chronic small vessel ischemic disease, less than expected for age. No suspicious parenchymal signal, mass effect. Stable 11 x 13 x 13 mm homogeneously bright T2 RIGHT parietal cortical based lesion completely suppresses on FLAIR sequence, without enhancement or solid component. No abnormal intraparenchymal or extra-axial enhancement. No abnormal extra-axial fluid collections. Resolution of RIGHT parietal/parafalcine fluid collection with very minimal hemosiderin staining. No extra-axial masses. Normal major intracranial vascular flow voids present at skull base. ORBITS: The included ocular globes and orbital contents are non-suspicious. Status post bilateral ocular lens implants. SINUSES: Small LEFT sphenoid mucosal retention cyst. Trace RIGHT mastoid effusion. SKULL/SOFT TISSUES: No abnormal sellar expansion. No suspicious calvarial bone marrow signal. Craniocervical junction maintained. IMPRESSION: No acute intracranial process. Slightly larger 11 x 11 x 13 cyst RIGHT parietal benign-appearing cyst, possibly neural glial though, recommend close attention on follow-up imaging. Resolution of RIGHT parietal extra-axial fluid collection, which was most compatible with blood products. Electronically Signed   By: Elon Alas M.D.   On: 01/04/2016 22:14   Ct Abdomen Pelvis W Contrast  Result Date: 01/06/2016 CLINICAL DATA:  Abdominal pain EXAM: CT ABDOMEN AND PELVIS WITH CONTRAST TECHNIQUE: Multidetector CT imaging of the abdomen and pelvis was  performed using the standard protocol following bolus administration of intravenous contrast. CONTRAST:  20mL ISOVUE-300 IOPAMIDOL (ISOVUE-300) INJECTION 61% COMPARISON:  CT abdomen pelvis 03/20/2013 FINDINGS: Lower chest: Tiny pleural effusions bilaterally. Right middle lobe infiltrate, possible pneumonia. Mild left lower lobe atelectasis/ infiltrate. Hepatobiliary: Postop cholecystectomy. Small cyst in the left lobe of the liver unchanged. No biliary dilatation. Pancreas: Pancreatic atrophy.  No mass or edema. Spleen: Negative Adrenals/Urinary Tract: No renal mass or obstruction. No urinary tract stone. Urinary bladder empty with a Foley catheter in place. Stomach/Bowel: Negative for bowel obstruction. Constipation with extensive stool throughout the colon. Appendectomy. No bowel edema. Vascular/Lymphatic: Atherosclerotic calcification throughout the abdominal aorta and its branches. No aneurysm. No adenopathy. Reproductive: Hysterectomy.  No pelvic mass. Other: Ventral hernia repair with mesh. 2 x 3 cm hernia along the lower margin of the mesh is unchanged from the prior study. Bowel loops extend into the hernia. No significant edema or fluid in the hernia sac. Musculoskeletal: Pedicle screw fusion T12 through L3. Multilevel degenerative change. Negative for fracture. No acute skeletal abnormality. IMPRESSION: Right middle lobe infiltrate, possible pneumonia. Chest x-ray recommended. Tiny pleural effusions bilaterally Constipation with extensive stool throughout the colon. Negative for bowel obstruction Ventral hernia recurrence the lower margin of the mesh unchanged from prior CT. Bowel loops extend into the hernia without edema or fluid in the hernia sac. Electronically Signed   By: Franchot Gallo M.D.   On: 01/06/2016 11:56   Dg Chest Port 1 View  Result Date: 01/04/2016 CLINICAL DATA:  Generalized weakness for the last 2 weeks EXAM: PORTABLE CHEST 1 VIEW COMPARISON:  03/24/2013 FINDINGS: Cardiomediastinal  silhouette is stable. No acute infiltrate or pleural effusion. No pulmonary edema.  Again noted postsurgical changes with metallic fixation rods and transpedicular screws lumbar spine. IMPRESSION: No active disease. Electronically Signed   By: Lahoma Crocker M.D.   On: 01/04/2016 16:38   Dg Fluoro Guide Lumbar Puncture  Result Date: 01/05/2016 CLINICAL DATA:  CT tonic patient. Headaches and shortness of breath. EXAM: DIAGNOSTIC LUMBAR PUNCTURE UNDER FLUOROSCOPIC GUIDANCE FLUOROSCOPY TIME:  Radiation Exposure Index (as provided by the fluoroscopic device): Dose area product equals 55.58 mcGy meter squared If the device does not provide the exposure index: Fluoroscopy Time (in minutes and seconds):  0 minutes 24 seconds Number of Acquired Images:  1 last image hold PROCEDURE: Informed consent was obtained from the patient prior to the procedure, including potential complications of headache, allergy, and pain. With the patient prone, the lower back was prepped with Betadine. 1% Lidocaine was used for local anesthesia. Lumbar puncture was performed at the L4-5 level using a 20 gauge needle with return of clear CSF with an opening pressure of 8 cm water. 8 ml of CSF were obtained for laboratory studies. The patient tolerated the procedure well and there were no apparent complications. IMPRESSION: Uncomplicated lumbar puncture performed at the L4-5 level under fluoroscopic guidance. Electronically Signed   By: Lucienne Capers M.D.   On: 01/05/2016 02:34    Lab Data:  CBC:  Recent Labs Lab 01/04/16 1600  01/05/16 0916 01/06/16 0250 01/07/16 0323 01/08/16 0221 01/09/16 0319  WBC 18.6*  --  23.0* 18.6* 18.7* 11.4* 9.9  NEUTROABS 15.3*  --   --   --   --   --   --   HGB 13.6  < > 11.4* 11.0* 10.5* 10.4* 10.4*  HCT 39.9  < > 33.9* 33.9* 32.0* 31.9* 32.3*  MCV 86.0  --  86.5 86.5 87.9 87.6 87.3  PLT 275  --  253 282 275 274 268  < > = values in this interval not displayed. Basic Metabolic Panel:  Recent  Labs Lab 01/05/16 0916 01/06/16 0250 01/07/16 0323 01/07/16 1710 01/08/16 0221 01/09/16 0319  NA 136 137 138  --  139 139  K 3.4* 3.1* 3.9  --  3.4* 3.8  CL 101 102 102  --  103 103  CO2 25 26 28   --  25 26  GLUCOSE 219* 172* 180*  --  263* 179*  BUN 17 15 21*  --  24* 29*  CREATININE 0.84 0.89 0.85  --  0.99 0.90  CALCIUM 9.2 9.1 9.2  --  9.1 9.0  MG  --   --   --  1.6*  --  2.0   GFR: Estimated Creatinine Clearance: 35.7 mL/min (by C-G formula based on SCr of 0.9 mg/dL). Liver Function Tests:  Recent Labs Lab 01/04/16 1600 01/05/16 0916  AST 32 24  ALT 16 14  ALKPHOS 37* 32*  BILITOT 1.2 0.6  PROT 7.9 6.4*  ALBUMIN 4.0 3.2*    Recent Labs Lab 01/04/16 1600  LIPASE 27    Recent Labs Lab 01/04/16 1600 01/05/16 0916  AMMONIA 26 22   Coagulation Profile:  Recent Labs Lab 01/04/16 1600  INR 1.02   Cardiac Enzymes:  Recent Labs Lab 01/04/16 1757 01/04/16 2249 01/05/16 0309 01/05/16 0916 01/05/16 1308 01/05/16 1800  CKTOTAL 60  --   --   --   --   --   TROPONINI  --  0.15* 0.16* 0.19*  0.18* 0.22* 0.24*   BNP (last 3 results) No results for input(s): PROBNP in the last  8760 hours. HbA1C: No results for input(s): HGBA1C in the last 72 hours. CBG:  Recent Labs Lab 01/04/16 2031 01/04/16 2233 01/07/16 0626  GLUCAP 182* 188* 146*   Lipid Profile: No results for input(s): CHOL, HDL, LDLCALC, TRIG, CHOLHDL, LDLDIRECT in the last 72 hours. Thyroid Function Tests: No results for input(s): TSH, T4TOTAL, FREET4, T3FREE, THYROIDAB in the last 72 hours. Anemia Panel: No results for input(s): VITAMINB12, FOLATE, FERRITIN, TIBC, IRON, RETICCTPCT in the last 72 hours. Urine analysis:    Component Value Date/Time   COLORURINE YELLOW 01/04/2016 1740   APPEARANCEUR CLEAR 01/04/2016 1740   LABSPEC 1.020 01/04/2016 1740   PHURINE 6.5 01/04/2016 1740   GLUCOSEU NEGATIVE 01/04/2016 1740   HGBUR NEGATIVE 01/04/2016 1740   BILIRUBINUR NEGATIVE  01/04/2016 1740   KETONESUR 15 (A) 01/04/2016 1740   PROTEINUR >300 (A) 01/04/2016 1740   UROBILINOGEN 0.2 03/20/2013 1403   NITRITE NEGATIVE 01/04/2016 1740   LEUKOCYTESUR NEGATIVE 01/04/2016 1740     Tamara Arnold M.D. [facility] 01/10/2016, 11:07 AM  Pager: [phone] Between 7am to 7pm - call Pager - [phone]  After 7pm go to [email or web address] - password [email or web address]  Call night coverage person covering after 7pm

## 2016-01-11 DIAGNOSIS — R41841 Cognitive communication deficit: Secondary | ICD-10-CM | POA: Diagnosis not present

## 2016-01-11 DIAGNOSIS — R1312 Dysphagia, oropharyngeal phase: Secondary | ICD-10-CM | POA: Diagnosis not present

## 2016-01-11 DIAGNOSIS — M6281 Muscle weakness (generalized): Secondary | ICD-10-CM | POA: Diagnosis not present

## 2016-01-11 DIAGNOSIS — Z9981 Dependence on supplemental oxygen: Secondary | ICD-10-CM | POA: Diagnosis not present

## 2016-01-11 DIAGNOSIS — Z8659 Personal history of other mental and behavioral disorders: Secondary | ICD-10-CM | POA: Diagnosis not present

## 2016-01-11 DIAGNOSIS — R51 Headache: Secondary | ICD-10-CM | POA: Diagnosis not present

## 2016-01-11 DIAGNOSIS — R4182 Altered mental status, unspecified: Secondary | ICD-10-CM | POA: Diagnosis not present

## 2016-01-11 DIAGNOSIS — G9340 Encephalopathy, unspecified: Secondary | ICD-10-CM | POA: Diagnosis not present

## 2016-01-11 DIAGNOSIS — J189 Pneumonia, unspecified organism: Secondary | ICD-10-CM | POA: Diagnosis not present

## 2016-01-11 DIAGNOSIS — K5901 Slow transit constipation: Secondary | ICD-10-CM | POA: Diagnosis not present

## 2016-01-11 DIAGNOSIS — Z0000 Encounter for general adult medical examination without abnormal findings: Secondary | ICD-10-CM | POA: Diagnosis not present

## 2016-01-11 DIAGNOSIS — I10 Essential (primary) hypertension: Secondary | ICD-10-CM | POA: Diagnosis not present

## 2016-01-11 DIAGNOSIS — R278 Other lack of coordination: Secondary | ICD-10-CM | POA: Diagnosis not present

## 2016-01-11 DIAGNOSIS — I248 Other forms of acute ischemic heart disease: Secondary | ICD-10-CM | POA: Diagnosis not present

## 2016-01-11 LAB — CBC
HCT: 32.3 % — ABNORMAL LOW (ref 36.0–46.0)
HEMOGLOBIN: 10.9 g/dL — AB (ref 12.0–15.0)
MCH: 29.3 pg (ref 26.0–34.0)
MCHC: 33.7 g/dL (ref 30.0–36.0)
MCV: 86.8 fL (ref 78.0–100.0)
Platelets: 347 10*3/uL (ref 150–400)
RBC: 3.72 MIL/uL — AB (ref 3.87–5.11)
RDW: 16.1 % — ABNORMAL HIGH (ref 11.5–15.5)
WBC: 10.8 10*3/uL — ABNORMAL HIGH (ref 4.0–10.5)

## 2016-01-11 LAB — BASIC METABOLIC PANEL
Anion gap: 8 (ref 5–15)
BUN: 20 mg/dL (ref 6–20)
CHLORIDE: 96 mmol/L — AB (ref 101–111)
CO2: 32 mmol/L (ref 22–32)
CREATININE: 0.8 mg/dL (ref 0.44–1.00)
Calcium: 9.5 mg/dL (ref 8.9–10.3)
GFR calc Af Amer: >60 mL/min (ref >60)
GFR calc non Af Amer: >60 mL/min (ref >60)
GLUCOSE: 205 mg/dL — AB (ref 65–99)
POTASSIUM: 3.1 mmol/L — AB (ref 3.5–5.1)
SODIUM: 136 mmol/L (ref 135–145)

## 2016-01-11 MED ORDER — ASPIRIN 325 MG PO TABS: 325.0000 mg | ORAL_TABLET | Freq: Every day | ORAL | Status: AC

## 2016-01-11 MED ORDER — SENNOSIDES-DOCUSATE SODIUM 8.6-50 MG PO TABS: 1.0000 | ORAL_TABLET | Freq: Every evening | ORAL | Status: AC | PRN

## 2016-01-11 MED ORDER — OXCARBAZEPINE 150 MG PO TABS: 150.0000 mg | ORAL_TABLET | Freq: Two times a day (BID) | ORAL | 0 refills | Status: AC

## 2016-01-11 MED ORDER — AMOXICILLIN-POT CLAVULANATE 500-125 MG PO TABS: 1.0000 | ORAL_TABLET | Freq: Two times a day (BID) | ORAL | Status: AC

## 2016-01-11 MED ORDER — OXCARBAZEPINE 150 MG PO TABS: 150.0000 mg | ORAL_TABLET | Freq: Two times a day (BID) | ORAL | 0 refills | Status: DC

## 2016-01-11 MED ORDER — TRAMADOL HCL 50 MG PO TABS: 50.0000 mg | ORAL_TABLET | Freq: Two times a day (BID) | ORAL | 0 refills | Status: AC | PRN

## 2016-01-11 MED ORDER — DILTIAZEM HCL ER COATED BEADS 180 MG PO CP24: 180.0000 mg | ORAL_CAPSULE | Freq: Every day | ORAL | Status: AC

## 2016-01-11 MED ORDER — QUETIAPINE FUMARATE 25 MG PO TABS: 25.0000 mg | ORAL_TABLET | Freq: Every day | ORAL | 0 refills | Status: AC

## 2016-01-11 MED ORDER — METOPROLOL TARTRATE 100 MG PO TABS: 100.0000 mg | ORAL_TABLET | Freq: Two times a day (BID) | ORAL | Status: AC

## 2016-01-11 NOTE — Clinical Social Work Note (Signed)
RN Report Information [facility] via [facility] Room #43-A Report [phone]  Pt is ready for discharge to [facility] via [facility]. [employer] Josem Kaufmann has been obtained [id number]). Pt's son is aware and agreeable to discharge plan. Facility is ready to accept pt as they have received discharge information. RN will call report. CSW is signing off as no further needs identified.   Darden Dates, MSW, LCSW Clinical Social Worker  [phone]

## 2016-01-11 NOTE — Progress Notes (Signed)
The foley catheter has been pulled as the physician ordered. 361ml was in the catheter. Patient tolerated it well.

## 2016-01-11 NOTE — Clinical Social Work Placement (Signed)
   CLINICAL SOCIAL WORK PLACEMENT  NOTE  Date:  01/11/2016  Patient Details  Name: Tamara Arnold MRN: [id number] Date of Birth: [birth year]  Clinical Social Work is seeking post-discharge placement for this patient at the [place] level of care (*CSW will initial, date and re-position this form in  chart as items are completed):  Yes   Patient/family provided with [facility] Work Department's list of facilities offering this level of care within the geographic area requested by the patient (or if unable, by the patient's family).  Yes   Patient/family informed of their freedom to choose among providers that offer the needed level of care, that participate in Medicare, Medicaid or managed care program needed by the patient, have an available bed and are willing to accept the patient.  Yes   Patient/family informed of [facility]'s ownership interest in [facility] and [facility], as well as of the fact that they are under no obligation to receive care at these facilities.  PASRR submitted to EDS on       PASRR number received on       Existing PASRR number confirmed on 01/08/16     FL2 transmitted to all facilities in geographic area requested by pt/family on 01/08/16     FL2 transmitted to all facilities within larger geographic area on       Patient informed that his/her managed care company has contracts with or will negotiate with certain facilities, including the following:        Yes   Patient/family informed of bed offers received.  Patient chooses bed at [facility]     Physician recommends and patient chooses bed at      Patient to be transferred to [facility] on 01/11/16.  Patient to be transferred to facility by [facility]     Patient family notified on 01/11/16 of transfer.  Name of family member notified:  Fritz Pickerel, son     PHYSICIAN Please sign FL2, Please prepare priority discharge summary, including  medications, Please sign DNR, Please prepare prescriptions     Additional Comment:    _______________________________________________ Darden Dates, LCSW 01/11/2016, 3:46 PM

## 2016-01-11 NOTE — Progress Notes (Signed)
Occupational Therapy Treatment Patient Details Name: Tamara Arnold MRN: [id number] DOB: 1925/07/29 Today's Date: 01/11/2016    History of present illness 82 y.o.femalewith medical history significant of chronic severe headaches and some paranoia/psychiatric illness in her later years but overall clear mental status despite her age presenting in a catatonic state. MRI negative for acute event.  Dx HTN; followed by Dr. Eugenie Birks (March 2017) with dx mild dementia, paranoid/delusional thinking, stricture of the cervical esophagus   OT comments  This 82 yo female making progress this session with grooming at sink and simulated toilet transfer.She will continue to benefit from OT at SNF to work towards increased independence with basic AD  Follow Up Recommendations  SNF;Supervision/Assistance - 24 hour    Equipment Recommendations   (TBD at next venue)       Precautions / Restrictions Precautions Precautions: Fall Restrictions Weight Bearing Restrictions: No       Mobility Bed Mobility               General bed mobility comments: pt up in recliner upon my arrival  Transfers Overall transfer level: Needs assistance Equipment used: Rolling walker (2 wheeled) Transfers: Sit to/from Stand Sit to Stand: Min assist              Balance Overall balance assessment: Needs assistance Sitting-balance support: No upper extremity supported;Feet supported Sitting balance-Leahy Scale: Fair     Standing balance support: Single extremity supported;During functional activity Standing balance-Leahy Scale: Poor Standing balance comment: reliant on one UE for support all the time (as in when brushing her teeth at sink) with ambulation she has both hands on RW                   ADL Overall ADL's : Needs assistance/impaired     Grooming: Oral care;Minimal assistance;Standing                   Toilet Transfer: Minimal assistance;Ambulation;RW Toilet Transfer Details  (indicate cue type and reason): recliner>sink to brush teeth>back to recliner                            Cognition   Behavior During Therapy: Flat affect Overall Cognitive Status:  (pt was living by herself at home pta)                                    Pertinent Vitals/ Pain       Pain Assessment: 0-10 Pain Score: 10-Worst pain ever Pain Location: headache Pain Descriptors / Indicators: Aching;Headache Pain Intervention(s): Monitored during session (RN in room when pt c/o headache)         Frequency Min 2X/week     Progress Toward Goals  OT Goals(current goals can now be found in the care plan section)  Progress towards OT goals: Progressing toward goals     Plan Discharge plan remains appropriate       End of Session Equipment Utilized During Treatment: Gait belt;Rolling walker   Activity Tolerance Patient tolerated treatment well   Patient Left in chair;with call bell/phone within reach;with chair alarm set   Nurse Communication  (pt asking about Boost or ensure to drink)        Time: [id number] OT Time Calculation (min): 20 min  Charges: OT General Charges $OT Visit: 1 Procedure OT Treatments $Self Care/Home Management : 8-22 mins  Almon Register [id number] 01/11/2016, 11:25 AM

## 2016-01-11 NOTE — Discharge Summary (Signed)
Physician Discharge Summary   Patient ID: Tamara Arnold MRN: [id number] DOB/AGE: 1925/08/18 82 y.o.  Admit date: 01/04/2016 Discharge date: 01/11/2016  Primary Care Physician:  Stephens Shire, MD  Discharge Diagnoses:    . Acute encephalopathy  . Right lung pneumonia . Dementia with depression and paranoid behavior . Hypertension  . Hyperlipidemia . SVTs   Elevated troponin   Constipation   Consults:  Cardiology Psychiatry  Recommendations for Outpatient Follow-up:  1. Patient will benefit from outpatient psychiatry close follow-up 2. Please repeat CBC/BMET at next visit   DIET: Heart healthy diet    Allergies:   Allergies  Allergen Reactions  . Gabapentin Other (See Comments)    headaches  . Prednisone Other (See Comments)    Headaches-all steroids  . Topiramate Other (See Comments)    Makes headaches worse  . Wellbutrin [Bupropion Hcl] Other (See Comments)    Caused headaches     DISCHARGE MEDICATIONS: Current Discharge Medication List    START taking these medications   Details  amoxicillin-clavulanate (AUGMENTIN) 500-125 MG tablet Take 1 tablet (500 mg total) by mouth every 12 (twelve) hours. X 2 more days, then OFF    aspirin 325 MG tablet Take 1 tablet (325 mg total) by mouth daily.    diltiazem (CARDIZEM CD) 180 MG 24 hr capsule Take 1 capsule (180 mg total) by mouth daily.    metoprolol (LOPRESSOR) 100 MG tablet Take 1 tablet (100 mg total) by mouth 2 (two) times daily.    OXcarbazepine (TRILEPTAL) 150 MG tablet Take 1 tablet (150 mg total) by mouth 2 (two) times daily. Qty: 30 tablet, Refills: 0    QUEtiapine (SEROQUEL) 25 MG tablet Take 1 tablet (25 mg total) by mouth at bedtime. Qty: 30 tablet, Refills: 0    senna-docusate (SENOKOT-S) 8.6-50 MG tablet Take 1 tablet by mouth at bedtime as needed for mild constipation.      CONTINUE these medications which have CHANGED   Details  traMADol (ULTRAM) 50 MG tablet Take 1 tablet (50 mg  total) by mouth every 12 (twelve) hours as needed for moderate pain. Qty: 20 tablet, Refills: 0      CONTINUE these medications which have NOT CHANGED   Details  acetaminophen (TYLENOL) 325 MG tablet Take 650 mg by mouth 3 (three) times daily as needed for mild pain or headache.     ascorbic acid (VITAMIN C) 500 MG tablet Take 500 mg by mouth every evening.     !! beta carotene w/minerals (OCUVITE) tablet Take 1 tablet by mouth daily.    Calcium Carb-Cholecalciferol (CALCIUM 600+D) 600-800 MG-UNIT TABS Take 1 tablet by mouth every evening.    fenofibrate 160 MG tablet Take 160 mg by mouth daily.    ferrous sulfate 325 (65 FE) MG tablet Take 325 mg by mouth daily at 12 noon.    fluticasone (FLONASE) 50 MCG/ACT nasal spray Place 2 sprays into the nose daily as needed for allergies (Seasonal).     folic acid (FOLVITE) 1 MG tablet Take 1 mg by mouth 2 (two) times daily.      hydrALAZINE (APRESOLINE) 50 MG tablet Take 50 mg by mouth 4 (four) times daily.     hydrochlorothiazide (HYDRODIURIL) 25 MG tablet Take 25 mg by mouth every evening.    lisinopril (PRINIVIL,ZESTRIL) 40 MG tablet Take 40 mg by mouth daily.    !! Multiple Vitamins-Minerals (MULTIVITAMINS THER. W/MINERALS) TABS Take 1 tablet by mouth daily.      omeprazole (PRILOSEC) 20  MG capsule Take 20 mg by mouth 2 (two) times daily before a meal.     polyethylene glycol (MIRALAX / GLYCOLAX) packet Take 34 g by mouth daily.     vitamin E (VITAMIN E) 400 UNIT capsule Take 400 Units by mouth daily.       !! - Potential duplicate medications found. Please discuss with provider.    STOP taking these medications     amitriptyline (ELAVIL) 25 MG tablet      amLODipine (NORVASC) 5 MG tablet      clonazePAM (KLONOPIN) 1 MG tablet      propranolol (INDERAL) 60 MG tablet          Brief H and P: For complete details please refer to admission H and P, but in brief Patient is a 82 year old female with chronic headaches, some  paranoia/psychiatry can illness, dementia presented with acute encephalopathy. She did have one episode of vomiting prior to admission. Per admission history, was not responding appropriately, no talking with glazed look and staring. Family brought patient to ED for further workup.[facility] Course:  Acute encephalopathy: Waxing and waning mental status with psychiatric illness, depression, dementia and paranoid behavior - Today patient is alert and oriented 3 and conversing normally. -  Appreciate psych recommendations. Started on Trileptal 150 mg twice a day for headache and mood, restart Seroquel 25 mg at bedtime paranoia -CT head, MRI negative for any CVA - UA negative for UTI, LP done, studies negative for any bacterial meningitis. HSV slightly positive at 1.29 which may indicate current or past HSV infection. Patient is now back to normal baseline, alert and oriented x3.  - Chest x-ray negative for any pneumonia, patient was placed on IV vancomycin and Zosyn, transitioned to oral Augmentin for 2 more days to complete full course - EEG consistent with possible mild encephalopathy, no seizure - B-1 normal, ceruloplasmin, B12, folate normal  - ammonia level normal  Lactic acidosis, leukocytosis, right lung pneumonia - CT abdomen showed right lung pneumonia, continue IV antibiotics - CSF cultures negative - blood cultures negative, sepsis ruled out  - Discontinued vancomycin, , DC Zosyn, placed on oral Augmentin, continue for 2 more days to complete full course   Headache with h/o paranoid/delusional thinking -Patient was last seen by neurology (Dr. Jannifer Franklin) in March 2017. She was showing evidence of paranoid/delusional thinking at that time and also had a score of 22/30 on the MMSE suggesting mild dementia.  HTN with SVTs - Cardiology was consulted, currently stable  - Appreciate cardiology recommendations, continue metoprolol, diltiazem for SVTs. -Continue HCTZ, lisinopril    Constipation - CT abdomen also showed significant stool, placed on stool softeners -Family reports long-standing constipation  Elevated troponin - Likely due to #1, sepsis physiology - 2-D echo showed EF of 60-65% with grade 2 diastolic dysfunction currently no complaint of chest pain   Day of Discharge BP (!) 183/43   Pulse 61   Temp 99 F (37.2 C) (Oral)   Resp 18   Ht 5\' 3"  (1.6 m)   Wt 56.6 kg (124 lb 11.2 oz)   SpO2 100%   BMI 22.09 kg/m   Physical Exam: General: Alert and awake oriented x3 not in any acute distress. HEENT: anicteric sclera, pupils reactive to light and accommodation CVS: S1-S2 clear no murmur rubs or gallops Chest: clear to auscultation bilaterally, no wheezing rales or rhonchi Abdomen: soft nontender, nondistended, normal bowel sounds Extremities: no cyanosis, clubbing or edema noted bilaterally Neuro: Cranial nerves  II-XII intact, no focal neurological deficits   The results of significant diagnostics from this hospitalization (including imaging, microbiology, ancillary and laboratory) are listed below for reference.    LAB RESULTS: Basic Metabolic Panel:  Recent Labs Lab 01/09/16 0319 01/11/16 0539  NA 139 136  K 3.8 3.1*  CL 103 96*  CO2 26 32  GLUCOSE 179* 205*  BUN 29* 20  CREATININE 0.90 0.80  CALCIUM 9.0 9.5  MG 2.0  --    Liver Function Tests:  Recent Labs Lab 01/04/16 1600 01/05/16 0916  AST 32 24  ALT 16 14  ALKPHOS 37* 32*  BILITOT 1.2 0.6  PROT 7.9 6.4*  ALBUMIN 4.0 3.2*    Recent Labs Lab 01/04/16 1600  LIPASE 27    Recent Labs Lab 01/04/16 1600 01/05/16 0916  AMMONIA 26 22   CBC:  Recent Labs Lab 01/04/16 1600  01/09/16 0319 01/11/16 0539  WBC 18.6*  < > 9.9 10.8*  NEUTROABS 15.3*  --   --   --   HGB 13.6  < > 10.4* 10.9*  HCT 39.9  < > 32.3* 32.3*  MCV 86.0  < > 87.3 86.8  PLT 275  < > 268 347  < > = values in this interval not displayed. Cardiac Enzymes:  Recent Labs Lab  01/04/16 1757  01/05/16 1308 01/05/16 1800  CKTOTAL 60  --   --   --   TROPONINI  --   < > 0.22* 0.24*  < > = values in this interval not displayed. BNP: Invalid input(s): POCBNP CBG:  Recent Labs Lab 01/04/16 2233 01/07/16 0626  GLUCAP 188* 146*    Significant Diagnostic Studies:  Ct Head Wo Contrast  Result Date: 01/04/2016 CLINICAL DATA:  Altered mental status. Generalized weakness over the last 1-2 weeks. EXAM: CT HEAD WITHOUT CONTRAST TECHNIQUE: Contiguous axial images were obtained from the base of the skull through the vertex without intravenous contrast. COMPARISON:  CT head without contrast 05/27/2015. FINDINGS: Brain: A tiny arachnoid cyst is again noted near the vertex on the right. No acute infarct, hemorrhage, or mass lesion is present. Minimal atrophy and white matter disease is within normal limits for age. The basal ganglia and insular ribbon are normal. No focal cortical lesions are present. Vascular: Atherosclerotic calcifications are present within the cavernous internal carotid arteries and at the dural margin of the vertebral arteries bilaterally. Skull: The calvarium is intact. Sinuses/Orbits: A polyp or mucous retention cyst within the right sphenoid sinus is stable. The remaining paranasal sinuses and the mastoid air cells are clear. The patient is status post bilateral lens replacements. The globes and orbits are otherwise intact. Other: No significant extracranial soft tissue lesion is present. IMPRESSION: 1. No acute intracranial abnormality or significant interval change. 2. Atherosclerosis. Electronically Signed   By: San Morelle M.D.   On: 01/04/2016 16:57   Mr Brain W Or Wo Contrast  Result Date: 01/04/2016 CLINICAL DATA:  Last seen normal at 1800 hours yesterday. Headache, altered mental status. Generalized weakness for 1-2 weeks. History of hypertension, hyperlipidemia, migraines, diabetes. EXAM: MRI HEAD WITHOUT AND WITH CONTRAST TECHNIQUE:  Multiplanar, multiecho pulse sequences of the brain and surrounding structures were obtained without and with intravenous contrast. CONTRAST:  61mL MULTIHANCE GADOBENATE DIMEGLUMINE 529 MG/ML IV SOLN COMPARISON:  CT HEAD January 04, 2016 at 1641 hours and MRI head November 29, 2011 and CT HEAD February 14, 2010 FINDINGS: INTRACRANIAL CONTENTS: No reduced diffusion to suggest acute ischemia. No susceptibility artifact to  suggest hemorrhage. The ventricles and sulci are normal for patient's age. Minimal white matter changes compatible chronic small vessel ischemic disease, less than expected for age. No suspicious parenchymal signal, mass effect. Stable 11 x 13 x 13 mm homogeneously bright T2 RIGHT parietal cortical based lesion completely suppresses on FLAIR sequence, without enhancement or solid component. No abnormal intraparenchymal or extra-axial enhancement. No abnormal extra-axial fluid collections. Resolution of RIGHT parietal/parafalcine fluid collection with very minimal hemosiderin staining. No extra-axial masses. Normal major intracranial vascular flow voids present at skull base. ORBITS: The included ocular globes and orbital contents are non-suspicious. Status post bilateral ocular lens implants. SINUSES: Small LEFT sphenoid mucosal retention cyst. Trace RIGHT mastoid effusion. SKULL/SOFT TISSUES: No abnormal sellar expansion. No suspicious calvarial bone marrow signal. Craniocervical junction maintained. IMPRESSION: No acute intracranial process. Slightly larger 11 x 11 x 13 cyst RIGHT parietal benign-appearing cyst, possibly neural glial though, recommend close attention on follow-up imaging. Resolution of RIGHT parietal extra-axial fluid collection, which was most compatible with blood products. Electronically Signed   By: Elon Alas M.D.   On: 01/04/2016 22:14   Dg Chest Port 1 View  Result Date: 01/04/2016 CLINICAL DATA:  Generalized weakness for the last 2 weeks EXAM: PORTABLE CHEST 1 VIEW  COMPARISON:  03/24/2013 FINDINGS: Cardiomediastinal silhouette is stable. No acute infiltrate or pleural effusion. No pulmonary edema. Again noted postsurgical changes with metallic fixation rods and transpedicular screws lumbar spine. IMPRESSION: No active disease. Electronically Signed   By: Lahoma Crocker M.D.   On: 01/04/2016 16:38   Dg Fluoro Guide Lumbar Puncture  Result Date: 01/05/2016 CLINICAL DATA:  CT tonic patient. Headaches and shortness of breath. EXAM: DIAGNOSTIC LUMBAR PUNCTURE UNDER FLUOROSCOPIC GUIDANCE FLUOROSCOPY TIME:  Radiation Exposure Index (as provided by the fluoroscopic device): Dose area product equals 55.58 mcGy meter squared If the device does not provide the exposure index: Fluoroscopy Time (in minutes and seconds):  0 minutes 24 seconds Number of Acquired Images:  1 last image hold PROCEDURE: Informed consent was obtained from the patient prior to the procedure, including potential complications of headache, allergy, and pain. With the patient prone, the lower back was prepped with Betadine. 1% Lidocaine was used for local anesthesia. Lumbar puncture was performed at the L4-5 level using a 20 gauge needle with return of clear CSF with an opening pressure of 8 cm water. 8 ml of CSF were obtained for laboratory studies. The patient tolerated the procedure well and there were no apparent complications. IMPRESSION: Uncomplicated lumbar puncture performed at the L4-5 level under fluoroscopic guidance. Electronically Signed   By: Lucienne Capers M.D.   On: 01/05/2016 02:34    2D ECHO: Study Conclusions  - Left ventricle: The cavity size was normal. There was mild   concentric hypertrophy. Systolic function was normal. The   estimated ejection fraction was in the range of 60% to 65%. Wall   motion was normal; there were no regional wall motion   abnormalities. Features are consistent with a pseudonormal left   ventricular filling pattern, with concomitant abnormal relaxation    and increased filling pressure (grade 2 diastolic dysfunction).   Doppler parameters are consistent with elevated mean left atrial   filling pressure. - Aortic valve: There was trivial regurgitation. - Mitral valve: Calcified annulus. Mildly thickened leaflets . - Pulmonary arteries: PA peak pressure: 35 mm Hg (S).   Disposition and Follow-up: Discharge Instructions    Diet - low sodium heart healthy    Complete by:  As  directed   Increase activity slowly    Complete by:  As directed       DISPOSITION: SNF  [place] [facility], MD. Schedule an appointment as soon as possible for a visit in 2 week(s).   Specialty:  Family Medicine Contact information: [id number] [place] [address] [phone]            Time spent on Discharge: 28mins   Signed:   Jackline Castilla M.D. [facility] 01/11/2016, 12:04 PM Pager: [phone]

## 2016-01-11 NOTE — Progress Notes (Signed)
Report given to [facility]. IV was dc'd and was intact. The patient had not urinated as of the time of report. We will try the bedside commode again. The nurse at [facility] has been made aware. She was also made aware of the sacral foam on her bottom and Stage 1 wound.

## 2016-01-11 NOTE — Consult Note (Signed)
   [facility] Inpatient Consult   01/11/2016  Tamara Arnold 04-18-1926 [id number]   Patient screened for potential [facility] services. Patient is eligible for [facility] Care Management services under patient's [employer] Medicare  plan. Spoke with inpatient RNCM and the patient and family's plan is for a skilled nursing facility.  Admitted with Encephalopathy, Dementia, and pneumonia. No current community follow up needs noted.    For questions contact:   Natividad Brood, RN BSN [facility] Liaison  [phone] business mobile phone Toll free office [phone]

## 2016-01-21 DIAGNOSIS — Z9981 Dependence on supplemental oxygen: Secondary | ICD-10-CM | POA: Diagnosis not present

## 2016-01-21 DIAGNOSIS — J189 Pneumonia, unspecified organism: Secondary | ICD-10-CM | POA: Diagnosis not present

## 2016-01-21 DIAGNOSIS — R41841 Cognitive communication deficit: Secondary | ICD-10-CM | POA: Diagnosis not present

## 2016-01-21 DIAGNOSIS — G9340 Encephalopathy, unspecified: Secondary | ICD-10-CM | POA: Diagnosis not present

## 2016-01-21 DIAGNOSIS — R1312 Dysphagia, oropharyngeal phase: Secondary | ICD-10-CM | POA: Diagnosis not present

## 2016-01-21 DIAGNOSIS — I10 Essential (primary) hypertension: Secondary | ICD-10-CM | POA: Diagnosis not present

## 2016-01-21 DIAGNOSIS — R278 Other lack of coordination: Secondary | ICD-10-CM | POA: Diagnosis not present

## 2016-01-21 DIAGNOSIS — M6281 Muscle weakness (generalized): Secondary | ICD-10-CM | POA: Diagnosis not present

## 2016-02-16 DIAGNOSIS — R1312 Dysphagia, oropharyngeal phase: Secondary | ICD-10-CM | POA: Diagnosis not present

## 2016-02-16 DIAGNOSIS — F0391 Unspecified dementia with behavioral disturbance: Secondary | ICD-10-CM | POA: Diagnosis not present

## 2016-02-16 DIAGNOSIS — I10 Essential (primary) hypertension: Secondary | ICD-10-CM | POA: Diagnosis not present

## 2016-02-16 DIAGNOSIS — Z9981 Dependence on supplemental oxygen: Secondary | ICD-10-CM | POA: Diagnosis not present

## 2016-04-05 DIAGNOSIS — M1990 Unspecified osteoarthritis, unspecified site: Secondary | ICD-10-CM | POA: Diagnosis not present

## 2016-04-05 DIAGNOSIS — K219 Gastro-esophageal reflux disease without esophagitis: Secondary | ICD-10-CM | POA: Diagnosis not present

## 2016-04-14 DIAGNOSIS — E119 Type 2 diabetes mellitus without complications: Secondary | ICD-10-CM | POA: Diagnosis not present

## 2016-04-14 DIAGNOSIS — F0390 Unspecified dementia without behavioral disturbance: Secondary | ICD-10-CM | POA: Diagnosis not present

## 2016-04-14 DIAGNOSIS — R1312 Dysphagia, oropharyngeal phase: Secondary | ICD-10-CM | POA: Diagnosis not present

## 2016-04-14 DIAGNOSIS — K219 Gastro-esophageal reflux disease without esophagitis: Secondary | ICD-10-CM | POA: Diagnosis not present

## 2016-05-10 DIAGNOSIS — F0391 Unspecified dementia with behavioral disturbance: Secondary | ICD-10-CM | POA: Diagnosis not present

## 2016-05-10 DIAGNOSIS — R1312 Dysphagia, oropharyngeal phase: Secondary | ICD-10-CM | POA: Diagnosis not present

## 2016-05-10 DIAGNOSIS — I10 Essential (primary) hypertension: Secondary | ICD-10-CM | POA: Diagnosis not present

## 2016-05-10 DIAGNOSIS — F319 Bipolar disorder, unspecified: Secondary | ICD-10-CM | POA: Diagnosis not present

## 2016-05-24 DIAGNOSIS — R0602 Shortness of breath: Secondary | ICD-10-CM | POA: Diagnosis not present

## 2016-05-25 DIAGNOSIS — E785 Hyperlipidemia, unspecified: Secondary | ICD-10-CM | POA: Diagnosis not present

## 2016-05-25 DIAGNOSIS — R0682 Tachypnea, not elsewhere classified: Secondary | ICD-10-CM | POA: Diagnosis not present

## 2016-05-29 DIAGNOSIS — E119 Type 2 diabetes mellitus without complications: Secondary | ICD-10-CM | POA: Diagnosis not present

## 2016-08-02 DIAGNOSIS — E559 Vitamin D deficiency, unspecified: Secondary | ICD-10-CM | POA: Diagnosis not present

## 2016-08-02 DIAGNOSIS — E119 Type 2 diabetes mellitus without complications: Secondary | ICD-10-CM | POA: Diagnosis not present

## 2016-08-02 DIAGNOSIS — M6281 Muscle weakness (generalized): Secondary | ICD-10-CM | POA: Diagnosis not present

## 2016-10-16 DIAGNOSIS — E11621 Type 2 diabetes mellitus with foot ulcer: Secondary | ICD-10-CM | POA: Diagnosis not present

## 2016-10-16 DIAGNOSIS — I10 Essential (primary) hypertension: Secondary | ICD-10-CM | POA: Diagnosis not present

## 2016-10-16 DIAGNOSIS — F0390 Unspecified dementia without behavioral disturbance: Secondary | ICD-10-CM | POA: Diagnosis not present

## 2016-10-17 DIAGNOSIS — I10 Essential (primary) hypertension: Secondary | ICD-10-CM | POA: Diagnosis not present

## 2016-10-17 DIAGNOSIS — E119 Type 2 diabetes mellitus without complications: Secondary | ICD-10-CM | POA: Diagnosis not present

## 2016-10-17 DIAGNOSIS — D649 Anemia, unspecified: Secondary | ICD-10-CM | POA: Diagnosis not present

## 2016-10-18 DIAGNOSIS — E559 Vitamin D deficiency, unspecified: Secondary | ICD-10-CM | POA: Diagnosis not present

## 2016-10-19 DIAGNOSIS — E559 Vitamin D deficiency, unspecified: Secondary | ICD-10-CM | POA: Diagnosis not present

## 2016-10-23 DIAGNOSIS — I10 Essential (primary) hypertension: Secondary | ICD-10-CM | POA: Diagnosis not present

## 2016-10-28 DIAGNOSIS — E785 Hyperlipidemia, unspecified: Secondary | ICD-10-CM | POA: Diagnosis not present

## 2016-10-28 DIAGNOSIS — E559 Vitamin D deficiency, unspecified: Secondary | ICD-10-CM | POA: Diagnosis not present

## 2016-10-28 DIAGNOSIS — I10 Essential (primary) hypertension: Secondary | ICD-10-CM | POA: Diagnosis not present

## 2016-10-28 DIAGNOSIS — E039 Hypothyroidism, unspecified: Secondary | ICD-10-CM | POA: Diagnosis not present

## 2016-10-28 DIAGNOSIS — E119 Type 2 diabetes mellitus without complications: Secondary | ICD-10-CM | POA: Diagnosis not present

## 2017-01-10 DIAGNOSIS — R627 Adult failure to thrive: Secondary | ICD-10-CM | POA: Diagnosis not present

## 2017-01-10 DIAGNOSIS — F0281 Dementia in other diseases classified elsewhere with behavioral disturbance: Secondary | ICD-10-CM | POA: Diagnosis not present

## 2017-01-10 DIAGNOSIS — R52 Pain, unspecified: Secondary | ICD-10-CM | POA: Diagnosis not present

## 2017-01-10 DIAGNOSIS — I5022 Chronic systolic (congestive) heart failure: Secondary | ICD-10-CM | POA: Diagnosis not present

## 2017-01-12 IMAGING — MR MR HEAD WO/W CM
9 of 12 series · 34 of 48 positions shown · IV contrast (multihance)
Comparison: CT HEAD January 04, 2016 at 5845 hours and MRI head November 29, 2011 and CT HEAD February 14, 2010

CLINICAL DATA: Last seen normal at 9122 hours yesterday. Headache,
altered mental status. Generalized weakness for 1-2 weeks. History
of hypertension, hyperlipidemia, migraines, diabetes.

EXAM:
MRI HEAD WITHOUT AND WITH CONTRAST
TECHNIQUE: Multiplanar, multiecho pulse sequences of the brain and surrounding
structures were obtained without and with intravenous contrast.
CONTRAST:  14mL MULTIHANCE GADOBENATE DIMEGLUMINE 529 MG/ML IV SOLN

[Series 3: DWI · axial · 3.0mm · 1.09mm/px · z∈[-33,+102]mm · 9 of 92 slices shown (1 of 4)]
[im 1/92]
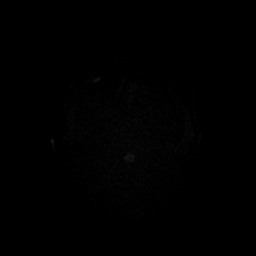
[im 12/92]
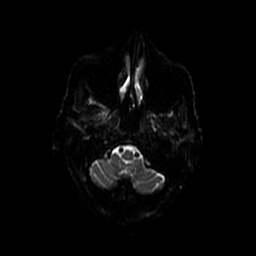
[im 23/92]
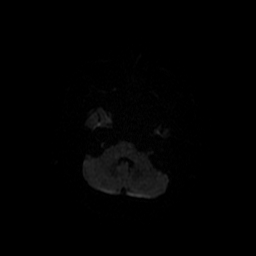
[im 35/92]
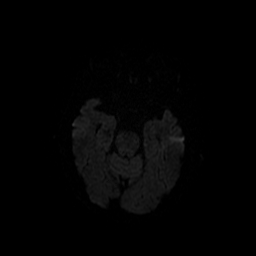
[im 46/92]
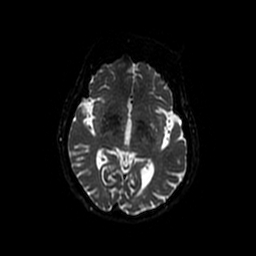
[im 57/92]
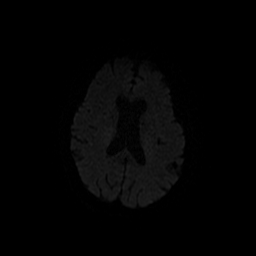
[im 69/92]
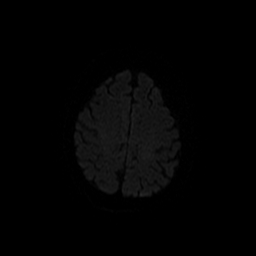
[im 80/92]
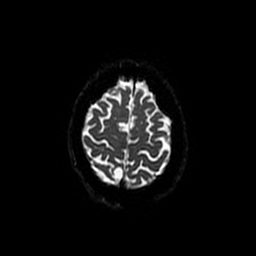
[im 92/92]
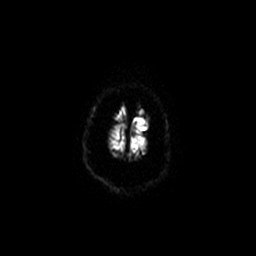

[Series 4: T2 · axial · 5.0mm · 0.47mm/px · z∈[-33,+99]mm · 2 of 23 slices shown (1 of 2)]
[im 1/23]
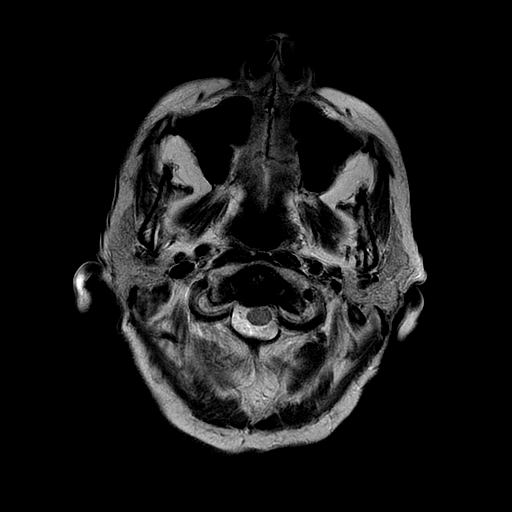
[im 23/23]
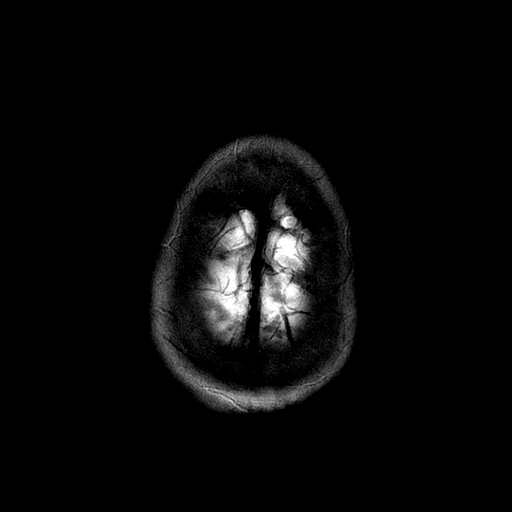

[Series 5: DWI · coronal · 5.0mm · 1.09mm/px · 6 of 72 slices shown (2 of 4)]
[im 1/72]
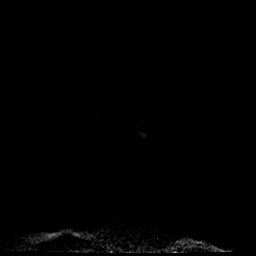
[im 15/72]
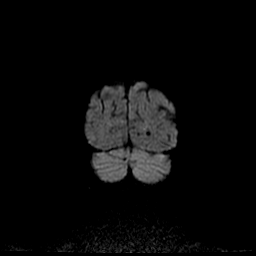
[im 29/72]
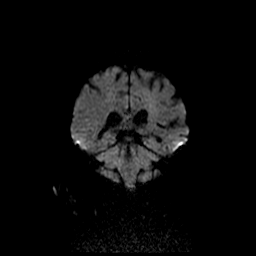
[im 43/72]
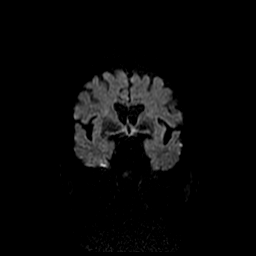
[im 57/72]
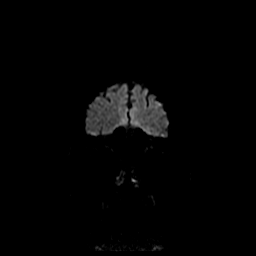
[im 72/72]
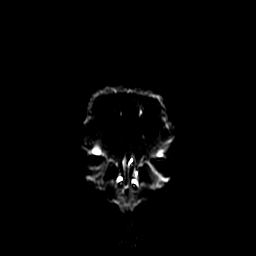

[Series 6: FLAIR · axial · 5.0mm · 0.47mm/px · z∈[-33,+99]mm · 2 of 23 slices shown]
[im 1/23]
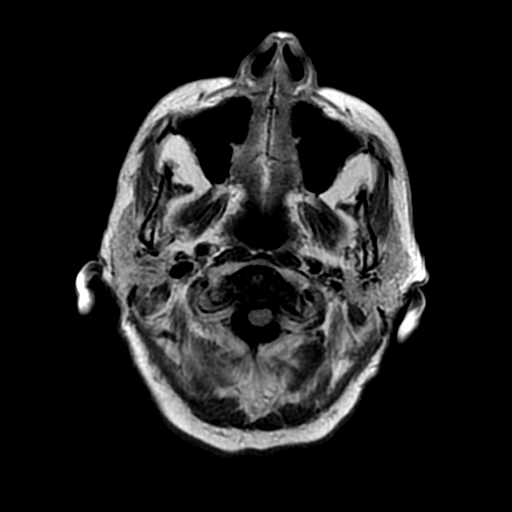
[im 23/23]
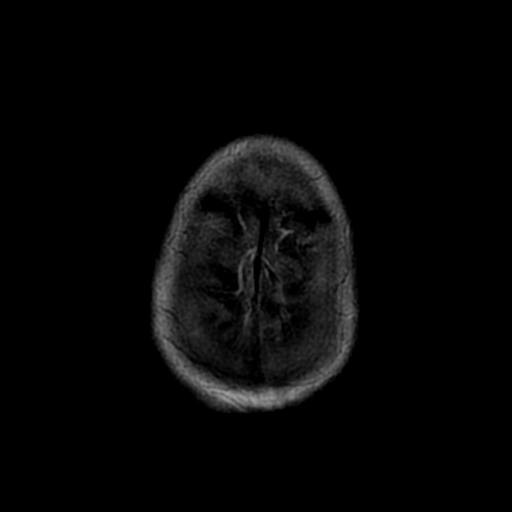

[Series 7: T1 · sagittal · 5.0mm · 0.47mm/px · 2 of 24 slices shown]
[im 1/24]
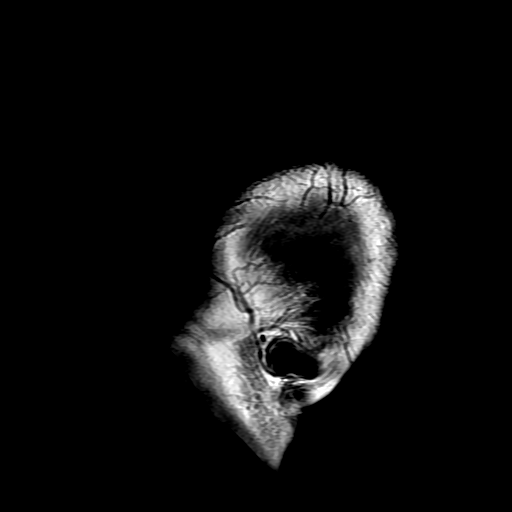
[im 24/24]
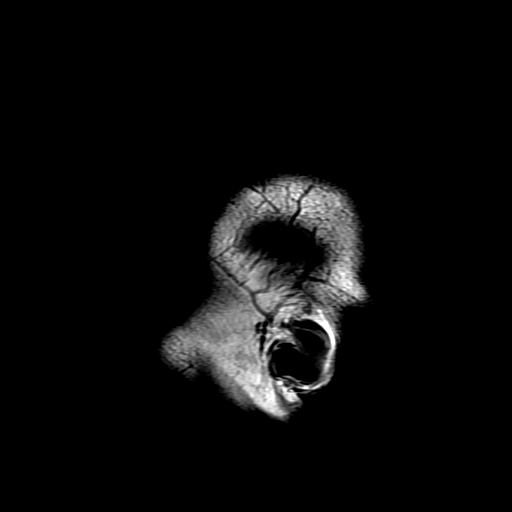

[Series 10: T2 · coronal · 5.0mm · 0.43mm/px · 3 of 30 slices shown (2 of 2)]
[im 1/30]
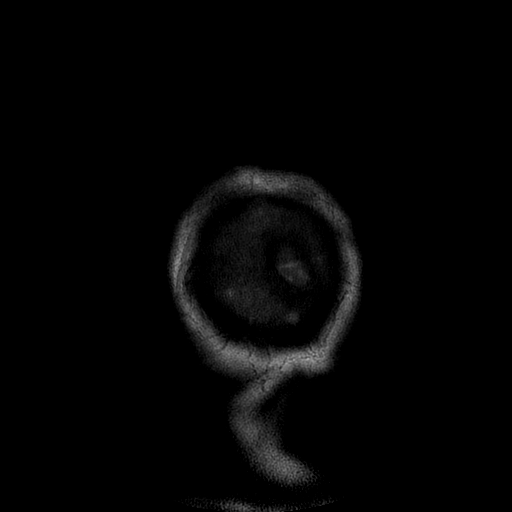
[im 15/30]
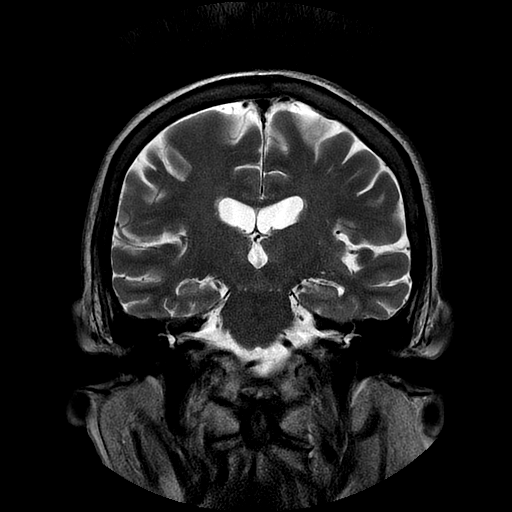
[im 30/30]
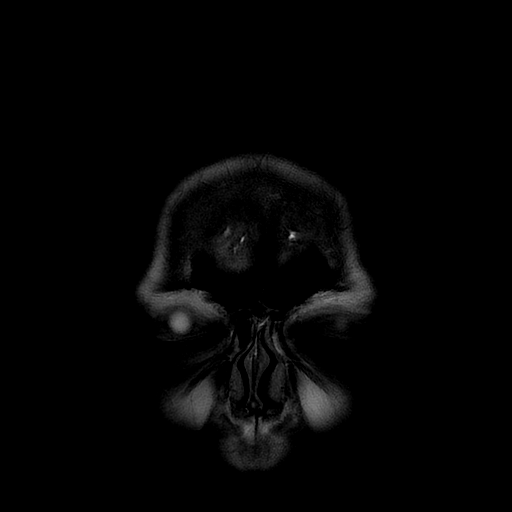

[Series 12: T1 post-contrast · coronal · 5.0mm · 0.43mm/px · 3 of 30 slices shown]
[im 1/30]
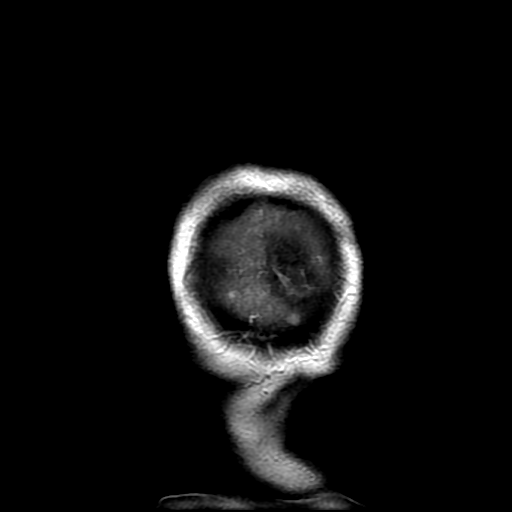
[im 15/30]
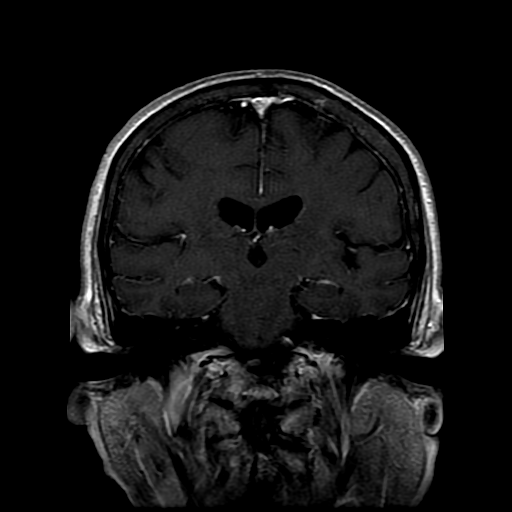
[im 30/30]
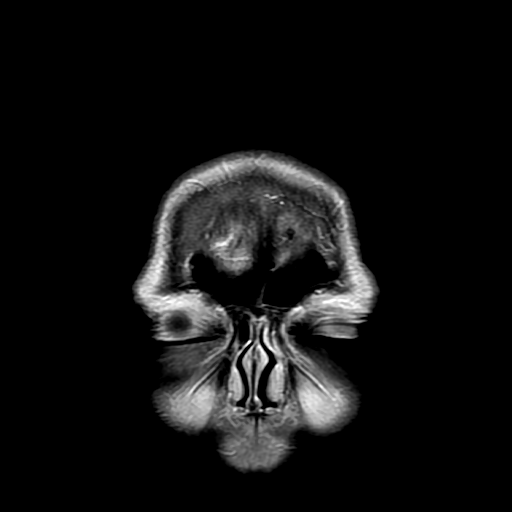

[Series 300: DWI · axial · 3.0mm · 1.09mm/px · z∈[-33,+102]mm · 4 of 46 slices shown (3 of 4)]
[im 1/46]
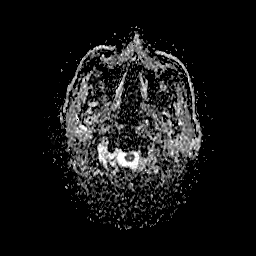
[im 16/46]
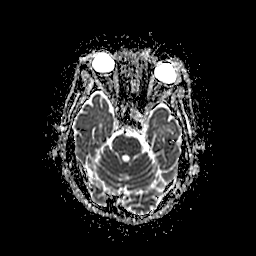
[im 31/46]
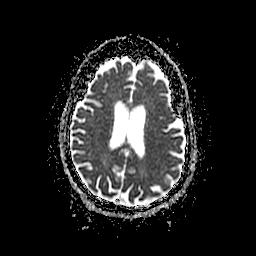
[im 46/46]
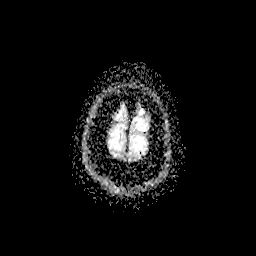

[Series 500: DWI · coronal · 5.0mm · 1.09mm/px · 3 of 36 slices shown (4 of 4)]
[im 1/36]
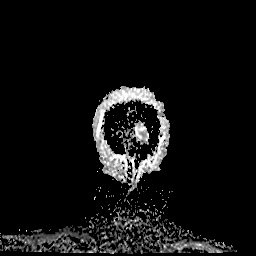
[im 18/36]
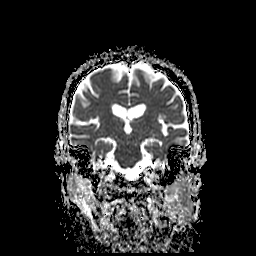
[im 36/36]
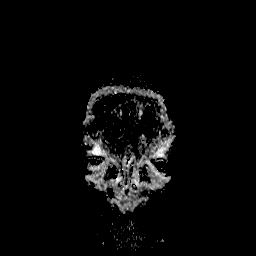

[34 of 48 positions shown; findings below may reference images not displayed]

FINDINGS: INTRACRANIAL CONTENTS: No reduced diffusion to suggest acute
ischemia. No susceptibility artifact to suggest hemorrhage. The
ventricles and sulci are normal for patient's age. Minimal white
matter changes compatible chronic small vessel ischemic disease,
less than expected for age. No suspicious parenchymal signal, mass
effect. Stable 11 x 13 x 13 mm homogeneously bright T2 RIGHT
parietal cortical based lesion completely suppresses on FLAIR
sequence, without enhancement or solid component. No abnormal
intraparenchymal or extra-axial enhancement. No abnormal extra-axial
fluid collections. Resolution of RIGHT parietal/parafalcine fluid
collection with very minimal hemosiderin staining. No extra-axial
masses. Normal major intracranial vascular flow voids present at
skull base.

ORBITS: The included ocular globes and orbital contents are
non-suspicious. Status post bilateral ocular lens implants.

SINUSES: Small LEFT sphenoid mucosal retention cyst. Trace RIGHT
mastoid effusion.

SKULL/SOFT TISSUES: No abnormal sellar expansion. No suspicious
calvarial bone marrow signal. Craniocervical junction maintained.
IMPRESSION: No acute intracranial process.

Slightly larger 11 x 11 x 13 cyst RIGHT parietal benign-appearing
cyst, possibly neural glial though, recommend close attention on
follow-up imaging.

Resolution of RIGHT parietal extra-axial fluid collection, which was
most compatible with blood products.

## 2017-02-22 DIAGNOSIS — E0810 Diabetes mellitus due to underlying condition with ketoacidosis without coma: Secondary | ICD-10-CM | POA: Diagnosis not present

## 2017-02-22 DIAGNOSIS — E139 Other specified diabetes mellitus without complications: Secondary | ICD-10-CM | POA: Diagnosis not present

## 2017-02-22 DIAGNOSIS — E119 Type 2 diabetes mellitus without complications: Secondary | ICD-10-CM | POA: Diagnosis not present

## 2017-03-13 DIAGNOSIS — E119 Type 2 diabetes mellitus without complications: Secondary | ICD-10-CM | POA: Diagnosis not present

## 2017-03-13 DIAGNOSIS — E139 Other specified diabetes mellitus without complications: Secondary | ICD-10-CM | POA: Diagnosis not present

## 2017-06-05 DIAGNOSIS — R1312 Dysphagia, oropharyngeal phase: Secondary | ICD-10-CM | POA: Diagnosis not present

## 2017-06-05 DIAGNOSIS — F0391 Unspecified dementia with behavioral disturbance: Secondary | ICD-10-CM | POA: Diagnosis not present

## 2017-06-05 DIAGNOSIS — F22 Delusional disorders: Secondary | ICD-10-CM | POA: Diagnosis not present

## 2017-06-05 DIAGNOSIS — F319 Bipolar disorder, unspecified: Secondary | ICD-10-CM | POA: Diagnosis not present

## 2017-06-27 DIAGNOSIS — D649 Anemia, unspecified: Secondary | ICD-10-CM | POA: Diagnosis not present

## 2017-06-27 DIAGNOSIS — E139 Other specified diabetes mellitus without complications: Secondary | ICD-10-CM | POA: Diagnosis not present

## 2017-06-27 DIAGNOSIS — E119 Type 2 diabetes mellitus without complications: Secondary | ICD-10-CM | POA: Diagnosis not present

## 2017-08-28 DIAGNOSIS — G890 Central pain syndrome: Secondary | ICD-10-CM | POA: Diagnosis not present

## 2017-08-28 DIAGNOSIS — F319 Bipolar disorder, unspecified: Secondary | ICD-10-CM | POA: Diagnosis not present

## 2017-08-28 DIAGNOSIS — F0391 Unspecified dementia with behavioral disturbance: Secondary | ICD-10-CM | POA: Diagnosis not present

## 2017-08-28 DIAGNOSIS — I10 Essential (primary) hypertension: Secondary | ICD-10-CM | POA: Diagnosis not present

## 2017-09-19 DIAGNOSIS — F0391 Unspecified dementia with behavioral disturbance: Secondary | ICD-10-CM | POA: Diagnosis not present

## 2017-09-19 DIAGNOSIS — D649 Anemia, unspecified: Secondary | ICD-10-CM | POA: Diagnosis not present

## 2017-09-21 DIAGNOSIS — I10 Essential (primary) hypertension: Secondary | ICD-10-CM | POA: Diagnosis not present

## 2017-09-21 DIAGNOSIS — D649 Anemia, unspecified: Secondary | ICD-10-CM | POA: Diagnosis not present

## 2017-09-28 DIAGNOSIS — H01021 Squamous blepharitis right upper eyelid: Secondary | ICD-10-CM | POA: Diagnosis not present

## 2017-09-28 DIAGNOSIS — H01022 Squamous blepharitis right lower eyelid: Secondary | ICD-10-CM | POA: Diagnosis not present

## 2017-09-28 DIAGNOSIS — H01025 Squamous blepharitis left lower eyelid: Secondary | ICD-10-CM | POA: Diagnosis not present

## 2017-09-28 DIAGNOSIS — H01024 Squamous blepharitis left upper eyelid: Secondary | ICD-10-CM | POA: Diagnosis not present

## 2017-09-28 DIAGNOSIS — H04123 Dry eye syndrome of bilateral lacrimal glands: Secondary | ICD-10-CM | POA: Diagnosis not present

## 2017-09-28 DIAGNOSIS — H1859 Other hereditary corneal dystrophies: Secondary | ICD-10-CM | POA: Diagnosis not present

## 2017-09-28 DIAGNOSIS — Z961 Presence of intraocular lens: Secondary | ICD-10-CM | POA: Diagnosis not present

## 2017-09-28 DIAGNOSIS — H353131 Nonexudative age-related macular degeneration, bilateral, early dry stage: Secondary | ICD-10-CM | POA: Diagnosis not present

## 2017-10-03 DIAGNOSIS — D492 Neoplasm of unspecified behavior of bone, soft tissue, and skin: Secondary | ICD-10-CM | POA: Diagnosis not present

## 2017-11-16 DIAGNOSIS — H903 Sensorineural hearing loss, bilateral: Secondary | ICD-10-CM | POA: Diagnosis not present

## 2017-11-30 DIAGNOSIS — H903 Sensorineural hearing loss, bilateral: Secondary | ICD-10-CM | POA: Diagnosis not present

## 2018-01-20 DIAGNOSIS — M545 Low back pain: Secondary | ICD-10-CM | POA: Diagnosis not present

## 2018-01-20 DIAGNOSIS — M6281 Muscle weakness (generalized): Secondary | ICD-10-CM | POA: Diagnosis not present

## 2018-01-20 DIAGNOSIS — F0391 Unspecified dementia with behavioral disturbance: Secondary | ICD-10-CM | POA: Diagnosis not present

## 2018-01-20 DIAGNOSIS — R2689 Other abnormalities of gait and mobility: Secondary | ICD-10-CM | POA: Diagnosis not present

## 2018-01-31 DIAGNOSIS — F0391 Unspecified dementia with behavioral disturbance: Secondary | ICD-10-CM | POA: Diagnosis not present

## 2018-01-31 DIAGNOSIS — E119 Type 2 diabetes mellitus without complications: Secondary | ICD-10-CM | POA: Diagnosis not present

## 2018-01-31 DIAGNOSIS — K219 Gastro-esophageal reflux disease without esophagitis: Secondary | ICD-10-CM | POA: Diagnosis not present

## 2018-01-31 DIAGNOSIS — F419 Anxiety disorder, unspecified: Secondary | ICD-10-CM | POA: Diagnosis not present

## 2018-02-01 DIAGNOSIS — R05 Cough: Secondary | ICD-10-CM | POA: Diagnosis not present

## 2018-02-15 DIAGNOSIS — N184 Chronic kidney disease, stage 4 (severe): Secondary | ICD-10-CM | POA: Diagnosis not present

## 2018-02-15 DIAGNOSIS — N179 Acute kidney failure, unspecified: Secondary | ICD-10-CM | POA: Diagnosis not present

## 2018-02-19 ENCOUNTER — Other Ambulatory Visit: Payer: Self-pay | Admitting: Nephrology

## 2018-02-19 DIAGNOSIS — N184 Chronic kidney disease, stage 4 (severe): Secondary | ICD-10-CM

## 2018-02-20 DIAGNOSIS — C44622 Squamous cell carcinoma of skin of right upper limb, including shoulder: Secondary | ICD-10-CM | POA: Diagnosis not present

## 2018-02-20 DIAGNOSIS — Z85828 Personal history of other malignant neoplasm of skin: Secondary | ICD-10-CM | POA: Diagnosis not present

## 2018-02-20 DIAGNOSIS — D485 Neoplasm of uncertain behavior of skin: Secondary | ICD-10-CM | POA: Diagnosis not present

## 2018-03-02 DIAGNOSIS — K219 Gastro-esophageal reflux disease without esophagitis: Secondary | ICD-10-CM | POA: Diagnosis not present

## 2018-03-02 DIAGNOSIS — E119 Type 2 diabetes mellitus without complications: Secondary | ICD-10-CM | POA: Diagnosis not present

## 2018-03-02 DIAGNOSIS — I10 Essential (primary) hypertension: Secondary | ICD-10-CM | POA: Diagnosis not present

## 2018-03-02 DIAGNOSIS — F419 Anxiety disorder, unspecified: Secondary | ICD-10-CM | POA: Diagnosis not present

## 2018-03-12 ENCOUNTER — Ambulatory Visit
Admission: RE | Admit: 2018-03-12 | Discharge: 2018-03-12 | Disposition: A | Discharge status: Home / Self Care | Payer: Self-pay | Source: Ambulatory Visit | Attending: Nephrology | Admitting: Nephrology

## 2018-03-12 DIAGNOSIS — N184 Chronic kidney disease, stage 4 (severe): Secondary | ICD-10-CM | POA: Diagnosis not present

## 2018-03-25 DIAGNOSIS — I10 Essential (primary) hypertension: Secondary | ICD-10-CM | POA: Diagnosis not present

## 2018-03-25 DIAGNOSIS — D649 Anemia, unspecified: Secondary | ICD-10-CM | POA: Diagnosis not present

## 2018-03-27 DIAGNOSIS — I129 Hypertensive chronic kidney disease with stage 1 through stage 4 chronic kidney disease, or unspecified chronic kidney disease: Secondary | ICD-10-CM | POA: Diagnosis not present

## 2018-03-27 DIAGNOSIS — E1122 Type 2 diabetes mellitus with diabetic chronic kidney disease: Secondary | ICD-10-CM | POA: Diagnosis not present

## 2018-03-27 DIAGNOSIS — N184 Chronic kidney disease, stage 4 (severe): Secondary | ICD-10-CM | POA: Diagnosis not present

## 2018-03-28 DIAGNOSIS — E119 Type 2 diabetes mellitus without complications: Secondary | ICD-10-CM | POA: Diagnosis not present

## 2018-03-28 DIAGNOSIS — F0391 Unspecified dementia with behavioral disturbance: Secondary | ICD-10-CM | POA: Diagnosis not present

## 2018-03-28 DIAGNOSIS — K219 Gastro-esophageal reflux disease without esophagitis: Secondary | ICD-10-CM | POA: Diagnosis not present

## 2018-03-28 DIAGNOSIS — E785 Hyperlipidemia, unspecified: Secondary | ICD-10-CM | POA: Diagnosis not present

## 2018-04-02 DIAGNOSIS — I10 Essential (primary) hypertension: Secondary | ICD-10-CM | POA: Diagnosis not present

## 2018-04-02 DIAGNOSIS — D649 Anemia, unspecified: Secondary | ICD-10-CM | POA: Diagnosis not present

## 2018-04-23 DIAGNOSIS — I10 Essential (primary) hypertension: Secondary | ICD-10-CM | POA: Diagnosis not present

## 2018-04-23 DIAGNOSIS — D649 Anemia, unspecified: Secondary | ICD-10-CM | POA: Diagnosis not present

## 2018-04-24 DIAGNOSIS — E119 Type 2 diabetes mellitus without complications: Secondary | ICD-10-CM | POA: Diagnosis not present

## 2018-04-24 DIAGNOSIS — F0391 Unspecified dementia with behavioral disturbance: Secondary | ICD-10-CM | POA: Diagnosis not present

## 2018-04-24 DIAGNOSIS — N183 Chronic kidney disease, stage 3 (moderate): Secondary | ICD-10-CM | POA: Diagnosis not present

## 2018-04-25 DIAGNOSIS — F0391 Unspecified dementia with behavioral disturbance: Secondary | ICD-10-CM | POA: Diagnosis not present

## 2018-04-25 DIAGNOSIS — N183 Chronic kidney disease, stage 3 (moderate): Secondary | ICD-10-CM | POA: Diagnosis not present

## 2018-04-25 DIAGNOSIS — I471 Supraventricular tachycardia: Secondary | ICD-10-CM | POA: Diagnosis not present

## 2018-04-25 DIAGNOSIS — E119 Type 2 diabetes mellitus without complications: Secondary | ICD-10-CM | POA: Diagnosis not present

## 2018-05-06 DIAGNOSIS — F0391 Unspecified dementia with behavioral disturbance: Secondary | ICD-10-CM | POA: Diagnosis not present

## 2018-05-06 DIAGNOSIS — M6281 Muscle weakness (generalized): Secondary | ICD-10-CM | POA: Diagnosis not present

## 2018-05-21 DIAGNOSIS — F0391 Unspecified dementia with behavioral disturbance: Secondary | ICD-10-CM | POA: Diagnosis not present

## 2018-05-21 DIAGNOSIS — I471 Supraventricular tachycardia: Secondary | ICD-10-CM | POA: Diagnosis not present

## 2018-05-21 DIAGNOSIS — N183 Chronic kidney disease, stage 3 (moderate): Secondary | ICD-10-CM | POA: Diagnosis not present

## 2018-05-21 DIAGNOSIS — E119 Type 2 diabetes mellitus without complications: Secondary | ICD-10-CM | POA: Diagnosis not present

## 2018-05-24 DIAGNOSIS — N183 Chronic kidney disease, stage 3 (moderate): Secondary | ICD-10-CM | POA: Diagnosis not present

## 2018-05-24 DIAGNOSIS — F0391 Unspecified dementia with behavioral disturbance: Secondary | ICD-10-CM | POA: Diagnosis not present

## 2018-05-24 DIAGNOSIS — E119 Type 2 diabetes mellitus without complications: Secondary | ICD-10-CM | POA: Diagnosis not present

## 2018-05-24 DIAGNOSIS — M545 Low back pain: Secondary | ICD-10-CM | POA: Diagnosis not present

## 2018-05-31 DIAGNOSIS — R609 Edema, unspecified: Secondary | ICD-10-CM | POA: Diagnosis not present

## 2018-06-20 DIAGNOSIS — E119 Type 2 diabetes mellitus without complications: Secondary | ICD-10-CM | POA: Diagnosis not present

## 2018-06-20 DIAGNOSIS — E785 Hyperlipidemia, unspecified: Secondary | ICD-10-CM | POA: Diagnosis not present

## 2018-06-20 DIAGNOSIS — F0390 Unspecified dementia without behavioral disturbance: Secondary | ICD-10-CM | POA: Diagnosis not present

## 2018-06-21 DIAGNOSIS — F0391 Unspecified dementia with behavioral disturbance: Secondary | ICD-10-CM | POA: Diagnosis not present

## 2018-06-21 DIAGNOSIS — N183 Chronic kidney disease, stage 3 (moderate): Secondary | ICD-10-CM | POA: Diagnosis not present

## 2018-06-21 DIAGNOSIS — E119 Type 2 diabetes mellitus without complications: Secondary | ICD-10-CM | POA: Diagnosis not present

## 2018-07-15 DIAGNOSIS — E119 Type 2 diabetes mellitus without complications: Secondary | ICD-10-CM | POA: Diagnosis not present

## 2018-07-15 DIAGNOSIS — N183 Chronic kidney disease, stage 3 (moderate): Secondary | ICD-10-CM | POA: Diagnosis not present

## 2018-07-15 DIAGNOSIS — F0391 Unspecified dementia with behavioral disturbance: Secondary | ICD-10-CM | POA: Diagnosis not present

## 2018-07-22 DIAGNOSIS — N184 Chronic kidney disease, stage 4 (severe): Secondary | ICD-10-CM | POA: Diagnosis not present

## 2018-07-22 DIAGNOSIS — I129 Hypertensive chronic kidney disease with stage 1 through stage 4 chronic kidney disease, or unspecified chronic kidney disease: Secondary | ICD-10-CM | POA: Diagnosis not present

## 2018-07-22 DIAGNOSIS — E1122 Type 2 diabetes mellitus with diabetic chronic kidney disease: Secondary | ICD-10-CM | POA: Diagnosis not present

## 2018-07-23 DIAGNOSIS — D649 Anemia, unspecified: Secondary | ICD-10-CM | POA: Diagnosis not present

## 2018-07-23 DIAGNOSIS — I10 Essential (primary) hypertension: Secondary | ICD-10-CM | POA: Diagnosis not present

## 2018-07-23 DIAGNOSIS — F0391 Unspecified dementia with behavioral disturbance: Secondary | ICD-10-CM | POA: Diagnosis not present

## 2018-07-23 DIAGNOSIS — E119 Type 2 diabetes mellitus without complications: Secondary | ICD-10-CM | POA: Diagnosis not present

## 2018-07-23 DIAGNOSIS — Z9981 Dependence on supplemental oxygen: Secondary | ICD-10-CM | POA: Diagnosis not present

## 2018-07-23 DIAGNOSIS — Z7982 Long term (current) use of aspirin: Secondary | ICD-10-CM | POA: Diagnosis not present

## 2018-07-23 DIAGNOSIS — I471 Supraventricular tachycardia: Secondary | ICD-10-CM | POA: Diagnosis not present

## 2018-07-23 DIAGNOSIS — N189 Chronic kidney disease, unspecified: Secondary | ICD-10-CM | POA: Diagnosis not present

## 2018-08-06 DIAGNOSIS — R0602 Shortness of breath: Secondary | ICD-10-CM | POA: Diagnosis not present

## 2018-08-07 DIAGNOSIS — N390 Urinary tract infection, site not specified: Secondary | ICD-10-CM | POA: Diagnosis not present

## 2018-08-07 DIAGNOSIS — R319 Hematuria, unspecified: Secondary | ICD-10-CM | POA: Diagnosis not present

## 2018-08-09 DIAGNOSIS — M549 Dorsalgia, unspecified: Secondary | ICD-10-CM | POA: Diagnosis not present

## 2018-08-09 DIAGNOSIS — R140 Abdominal distension (gaseous): Secondary | ICD-10-CM | POA: Diagnosis not present

## 2018-08-09 DIAGNOSIS — E119 Type 2 diabetes mellitus without complications: Secondary | ICD-10-CM | POA: Diagnosis not present

## 2018-08-09 DIAGNOSIS — G44209 Tension-type headache, unspecified, not intractable: Secondary | ICD-10-CM | POA: Diagnosis not present

## 2018-08-10 DIAGNOSIS — M546 Pain in thoracic spine: Secondary | ICD-10-CM | POA: Diagnosis not present

## 2018-08-10 DIAGNOSIS — R109 Unspecified abdominal pain: Secondary | ICD-10-CM | POA: Diagnosis not present

## 2018-08-10 DIAGNOSIS — M542 Cervicalgia: Secondary | ICD-10-CM | POA: Diagnosis not present

## 2018-08-10 DIAGNOSIS — M545 Low back pain: Secondary | ICD-10-CM | POA: Diagnosis not present

## 2018-08-15 DIAGNOSIS — F0391 Unspecified dementia with behavioral disturbance: Secondary | ICD-10-CM | POA: Diagnosis not present

## 2018-08-15 DIAGNOSIS — G44209 Tension-type headache, unspecified, not intractable: Secondary | ICD-10-CM | POA: Diagnosis not present

## 2018-08-15 DIAGNOSIS — M542 Cervicalgia: Secondary | ICD-10-CM | POA: Diagnosis not present

## 2018-08-15 DIAGNOSIS — M549 Dorsalgia, unspecified: Secondary | ICD-10-CM | POA: Diagnosis not present

## 2018-08-23 DIAGNOSIS — M7072 Other bursitis of hip, left hip: Secondary | ICD-10-CM | POA: Diagnosis not present

## 2018-08-23 DIAGNOSIS — M542 Cervicalgia: Secondary | ICD-10-CM | POA: Diagnosis not present

## 2018-08-23 DIAGNOSIS — G44209 Tension-type headache, unspecified, not intractable: Secondary | ICD-10-CM | POA: Diagnosis not present

## 2018-08-23 DIAGNOSIS — M461 Sacroiliitis, not elsewhere classified: Secondary | ICD-10-CM | POA: Diagnosis not present

## 2018-08-23 DIAGNOSIS — M549 Dorsalgia, unspecified: Secondary | ICD-10-CM | POA: Diagnosis not present

## 2018-08-23 DIAGNOSIS — E119 Type 2 diabetes mellitus without complications: Secondary | ICD-10-CM | POA: Diagnosis not present

## 2018-09-10 DIAGNOSIS — M549 Dorsalgia, unspecified: Secondary | ICD-10-CM | POA: Diagnosis not present

## 2018-09-10 DIAGNOSIS — M542 Cervicalgia: Secondary | ICD-10-CM | POA: Diagnosis not present

## 2018-09-10 DIAGNOSIS — R609 Edema, unspecified: Secondary | ICD-10-CM | POA: Diagnosis not present

## 2018-09-10 DIAGNOSIS — G44209 Tension-type headache, unspecified, not intractable: Secondary | ICD-10-CM | POA: Diagnosis not present

## 2018-09-12 DIAGNOSIS — M542 Cervicalgia: Secondary | ICD-10-CM | POA: Diagnosis not present

## 2018-09-12 DIAGNOSIS — R609 Edema, unspecified: Secondary | ICD-10-CM | POA: Diagnosis not present

## 2018-09-12 DIAGNOSIS — M549 Dorsalgia, unspecified: Secondary | ICD-10-CM | POA: Diagnosis not present

## 2018-09-12 DIAGNOSIS — G44209 Tension-type headache, unspecified, not intractable: Secondary | ICD-10-CM | POA: Diagnosis not present

## 2018-09-13 DIAGNOSIS — D649 Anemia, unspecified: Secondary | ICD-10-CM | POA: Diagnosis not present

## 2018-09-13 DIAGNOSIS — Z79899 Other long term (current) drug therapy: Secondary | ICD-10-CM | POA: Diagnosis not present

## 2018-09-17 DIAGNOSIS — R102 Pelvic and perineal pain: Secondary | ICD-10-CM | POA: Diagnosis not present

## 2018-09-17 DIAGNOSIS — R609 Edema, unspecified: Secondary | ICD-10-CM | POA: Diagnosis not present

## 2018-09-17 DIAGNOSIS — R100 Acute abdomen: Secondary | ICD-10-CM | POA: Diagnosis not present

## 2018-09-17 DIAGNOSIS — G44209 Tension-type headache, unspecified, not intractable: Secondary | ICD-10-CM | POA: Diagnosis not present

## 2018-09-18 DIAGNOSIS — E708 Other disorders of aromatic amino-acid metabolism: Secondary | ICD-10-CM | POA: Diagnosis not present

## 2018-09-18 DIAGNOSIS — R1033 Periumbilical pain: Secondary | ICD-10-CM | POA: Diagnosis not present

## 2018-09-20 DIAGNOSIS — I10 Essential (primary) hypertension: Secondary | ICD-10-CM | POA: Diagnosis not present

## 2018-09-20 DIAGNOSIS — D649 Anemia, unspecified: Secondary | ICD-10-CM | POA: Diagnosis not present

## 2018-09-23 DIAGNOSIS — N184 Chronic kidney disease, stage 4 (severe): Secondary | ICD-10-CM | POA: Diagnosis not present

## 2018-09-23 DIAGNOSIS — D649 Anemia, unspecified: Secondary | ICD-10-CM | POA: Diagnosis not present

## 2018-09-30 DIAGNOSIS — I10 Essential (primary) hypertension: Secondary | ICD-10-CM | POA: Diagnosis not present

## 2018-09-30 DIAGNOSIS — D649 Anemia, unspecified: Secondary | ICD-10-CM | POA: Diagnosis not present

## 2018-10-10 DIAGNOSIS — R100 Acute abdomen: Secondary | ICD-10-CM | POA: Diagnosis not present

## 2018-10-10 DIAGNOSIS — R609 Edema, unspecified: Secondary | ICD-10-CM | POA: Diagnosis not present

## 2018-10-10 DIAGNOSIS — F329 Major depressive disorder, single episode, unspecified: Secondary | ICD-10-CM | POA: Diagnosis not present

## 2018-10-10 DIAGNOSIS — R102 Pelvic and perineal pain: Secondary | ICD-10-CM | POA: Diagnosis not present

## 2018-10-15 DIAGNOSIS — M549 Dorsalgia, unspecified: Secondary | ICD-10-CM | POA: Diagnosis not present

## 2018-10-15 DIAGNOSIS — G44209 Tension-type headache, unspecified, not intractable: Secondary | ICD-10-CM | POA: Diagnosis not present

## 2018-10-15 DIAGNOSIS — M542 Cervicalgia: Secondary | ICD-10-CM | POA: Diagnosis not present

## 2018-10-15 DIAGNOSIS — D649 Anemia, unspecified: Secondary | ICD-10-CM | POA: Diagnosis not present

## 2018-10-15 DIAGNOSIS — R609 Edema, unspecified: Secondary | ICD-10-CM | POA: Diagnosis not present

## 2018-10-15 DIAGNOSIS — I10 Essential (primary) hypertension: Secondary | ICD-10-CM | POA: Diagnosis not present

## 2018-10-25 DIAGNOSIS — I10 Essential (primary) hypertension: Secondary | ICD-10-CM | POA: Diagnosis not present

## 2018-10-25 DIAGNOSIS — D649 Anemia, unspecified: Secondary | ICD-10-CM | POA: Diagnosis not present

## 2018-11-01 DIAGNOSIS — D649 Anemia, unspecified: Secondary | ICD-10-CM | POA: Diagnosis not present

## 2018-11-01 DIAGNOSIS — N184 Chronic kidney disease, stage 4 (severe): Secondary | ICD-10-CM | POA: Diagnosis not present

## 2018-11-07 DIAGNOSIS — R609 Edema, unspecified: Secondary | ICD-10-CM | POA: Diagnosis not present

## 2018-11-07 DIAGNOSIS — M549 Dorsalgia, unspecified: Secondary | ICD-10-CM | POA: Diagnosis not present

## 2018-11-07 DIAGNOSIS — G44209 Tension-type headache, unspecified, not intractable: Secondary | ICD-10-CM | POA: Diagnosis not present

## 2018-11-07 DIAGNOSIS — E119 Type 2 diabetes mellitus without complications: Secondary | ICD-10-CM | POA: Diagnosis not present

## 2018-11-08 DIAGNOSIS — G44209 Tension-type headache, unspecified, not intractable: Secondary | ICD-10-CM | POA: Diagnosis not present

## 2018-11-08 DIAGNOSIS — M549 Dorsalgia, unspecified: Secondary | ICD-10-CM | POA: Diagnosis not present

## 2018-11-08 DIAGNOSIS — E119 Type 2 diabetes mellitus without complications: Secondary | ICD-10-CM | POA: Diagnosis not present

## 2018-11-08 DIAGNOSIS — R609 Edema, unspecified: Secondary | ICD-10-CM | POA: Diagnosis not present

## 2018-11-15 DIAGNOSIS — R609 Edema, unspecified: Secondary | ICD-10-CM | POA: Diagnosis not present

## 2018-11-15 DIAGNOSIS — N189 Chronic kidney disease, unspecified: Secondary | ICD-10-CM | POA: Diagnosis not present

## 2018-11-15 DIAGNOSIS — G44209 Tension-type headache, unspecified, not intractable: Secondary | ICD-10-CM | POA: Diagnosis not present

## 2018-11-15 DIAGNOSIS — I10 Essential (primary) hypertension: Secondary | ICD-10-CM | POA: Diagnosis not present

## 2018-12-04 DIAGNOSIS — G44209 Tension-type headache, unspecified, not intractable: Secondary | ICD-10-CM | POA: Diagnosis not present

## 2018-12-04 DIAGNOSIS — M549 Dorsalgia, unspecified: Secondary | ICD-10-CM | POA: Diagnosis not present

## 2018-12-04 DIAGNOSIS — R609 Edema, unspecified: Secondary | ICD-10-CM | POA: Diagnosis not present

## 2018-12-04 DIAGNOSIS — I10 Essential (primary) hypertension: Secondary | ICD-10-CM | POA: Diagnosis not present

## 2018-12-05 DIAGNOSIS — K219 Gastro-esophageal reflux disease without esophagitis: Secondary | ICD-10-CM | POA: Diagnosis not present

## 2018-12-05 DIAGNOSIS — R609 Edema, unspecified: Secondary | ICD-10-CM | POA: Diagnosis not present

## 2018-12-05 DIAGNOSIS — M549 Dorsalgia, unspecified: Secondary | ICD-10-CM | POA: Diagnosis not present

## 2018-12-05 DIAGNOSIS — M542 Cervicalgia: Secondary | ICD-10-CM | POA: Diagnosis not present

## 2019-01-06 DIAGNOSIS — G44209 Tension-type headache, unspecified, not intractable: Secondary | ICD-10-CM | POA: Diagnosis not present

## 2019-01-06 DIAGNOSIS — R609 Edema, unspecified: Secondary | ICD-10-CM | POA: Diagnosis not present

## 2019-01-06 DIAGNOSIS — F0391 Unspecified dementia with behavioral disturbance: Secondary | ICD-10-CM | POA: Diagnosis not present

## 2019-01-06 DIAGNOSIS — I10 Essential (primary) hypertension: Secondary | ICD-10-CM | POA: Diagnosis not present

## 2019-01-09 DIAGNOSIS — F0391 Unspecified dementia with behavioral disturbance: Secondary | ICD-10-CM | POA: Diagnosis not present

## 2019-01-09 DIAGNOSIS — R609 Edema, unspecified: Secondary | ICD-10-CM | POA: Diagnosis not present

## 2019-01-09 DIAGNOSIS — G44209 Tension-type headache, unspecified, not intractable: Secondary | ICD-10-CM | POA: Diagnosis not present

## 2019-01-09 DIAGNOSIS — I10 Essential (primary) hypertension: Secondary | ICD-10-CM | POA: Diagnosis not present

## 2019-01-30 DIAGNOSIS — N189 Chronic kidney disease, unspecified: Secondary | ICD-10-CM | POA: Diagnosis not present

## 2019-01-30 DIAGNOSIS — G44209 Tension-type headache, unspecified, not intractable: Secondary | ICD-10-CM | POA: Diagnosis not present

## 2019-01-30 DIAGNOSIS — F0391 Unspecified dementia with behavioral disturbance: Secondary | ICD-10-CM | POA: Diagnosis not present

## 2019-01-30 DIAGNOSIS — R609 Edema, unspecified: Secondary | ICD-10-CM | POA: Diagnosis not present

## 2019-02-05 DIAGNOSIS — I10 Essential (primary) hypertension: Secondary | ICD-10-CM | POA: Diagnosis not present

## 2019-02-05 DIAGNOSIS — G44209 Tension-type headache, unspecified, not intractable: Secondary | ICD-10-CM | POA: Diagnosis not present

## 2019-02-05 DIAGNOSIS — F0391 Unspecified dementia with behavioral disturbance: Secondary | ICD-10-CM | POA: Diagnosis not present

## 2019-02-05 DIAGNOSIS — E119 Type 2 diabetes mellitus without complications: Secondary | ICD-10-CM | POA: Diagnosis not present

## 2019-02-20 DIAGNOSIS — U071 COVID-19: Secondary | ICD-10-CM | POA: Diagnosis not present

## 2019-02-27 DIAGNOSIS — F0391 Unspecified dementia with behavioral disturbance: Secondary | ICD-10-CM | POA: Diagnosis not present

## 2019-02-27 DIAGNOSIS — G44209 Tension-type headache, unspecified, not intractable: Secondary | ICD-10-CM | POA: Diagnosis not present

## 2019-02-27 DIAGNOSIS — R609 Edema, unspecified: Secondary | ICD-10-CM | POA: Diagnosis not present

## 2019-02-27 DIAGNOSIS — I10 Essential (primary) hypertension: Secondary | ICD-10-CM | POA: Diagnosis not present

## 2019-03-03 DIAGNOSIS — F0391 Unspecified dementia with behavioral disturbance: Secondary | ICD-10-CM | POA: Diagnosis not present

## 2019-03-03 DIAGNOSIS — M549 Dorsalgia, unspecified: Secondary | ICD-10-CM | POA: Diagnosis not present

## 2019-03-03 DIAGNOSIS — G44209 Tension-type headache, unspecified, not intractable: Secondary | ICD-10-CM | POA: Diagnosis not present

## 2019-03-03 DIAGNOSIS — R609 Edema, unspecified: Secondary | ICD-10-CM | POA: Diagnosis not present

## 2019-03-07 ENCOUNTER — Emergency Department (HOSPITAL_COMMUNITY): Payer: PPO

## 2019-03-07 ENCOUNTER — Other Ambulatory Visit: Payer: Self-pay

## 2019-03-07 ENCOUNTER — Emergency Department (HOSPITAL_COMMUNITY)
Admission: EM | Admit: 2019-03-07 | Discharge: 2019-03-07 | Disposition: A | Discharge status: Skilled Nursing Facility | Payer: PPO | Attending: Emergency Medicine | Admitting: Emergency Medicine

## 2019-03-07 DIAGNOSIS — S0003XA Contusion of scalp, initial encounter: Secondary | ICD-10-CM | POA: Diagnosis not present

## 2019-03-07 DIAGNOSIS — Z7982 Long term (current) use of aspirin: Secondary | ICD-10-CM | POA: Insufficient documentation

## 2019-03-07 DIAGNOSIS — Y999 Unspecified external cause status: Secondary | ICD-10-CM | POA: Insufficient documentation

## 2019-03-07 DIAGNOSIS — S199XXA Unspecified injury of neck, initial encounter: Secondary | ICD-10-CM | POA: Diagnosis not present

## 2019-03-07 DIAGNOSIS — Y9389 Activity, other specified: Secondary | ICD-10-CM | POA: Diagnosis not present

## 2019-03-07 DIAGNOSIS — F0390 Unspecified dementia without behavioral disturbance: Secondary | ICD-10-CM | POA: Insufficient documentation

## 2019-03-07 DIAGNOSIS — E119 Type 2 diabetes mellitus without complications: Secondary | ICD-10-CM | POA: Diagnosis not present

## 2019-03-07 DIAGNOSIS — I10 Essential (primary) hypertension: Secondary | ICD-10-CM | POA: Insufficient documentation

## 2019-03-07 DIAGNOSIS — Z23 Encounter for immunization: Secondary | ICD-10-CM | POA: Diagnosis not present

## 2019-03-07 DIAGNOSIS — S0990XA Unspecified injury of head, initial encounter: Secondary | ICD-10-CM

## 2019-03-07 DIAGNOSIS — W01198A Fall on same level from slipping, tripping and stumbling with subsequent striking against other object, initial encounter: Secondary | ICD-10-CM | POA: Insufficient documentation

## 2019-03-07 DIAGNOSIS — Z79899 Other long term (current) drug therapy: Secondary | ICD-10-CM | POA: Insufficient documentation

## 2019-03-07 DIAGNOSIS — Y92128 Other place in nursing home as the place of occurrence of the external cause: Secondary | ICD-10-CM | POA: Diagnosis not present

## 2019-03-07 DIAGNOSIS — W19XXXA Unspecified fall, initial encounter: Secondary | ICD-10-CM | POA: Diagnosis not present

## 2019-03-07 DIAGNOSIS — R110 Nausea: Secondary | ICD-10-CM | POA: Diagnosis not present

## 2019-03-07 DIAGNOSIS — S81811A Laceration without foreign body, right lower leg, initial encounter: Secondary | ICD-10-CM | POA: Insufficient documentation

## 2019-03-07 MED ORDER — TETANUS-DIPHTH-ACELL PERTUSSIS 5-2.5-18.5 LF-MCG/0.5 IM SUSP
0.5000 mL | Freq: Once | INTRAMUSCULAR | Status: AC
  Administered 2019-03-07: 16:00:00 0.5 mL via INTRAMUSCULAR
  Filled 2019-03-07: qty 0.5

## 2019-03-07 MED ORDER — ACETAMINOPHEN 325 MG PO TABS
650.0000 mg | ORAL_TABLET | Freq: Once | ORAL | Status: AC
  Administered 2019-03-07: 16:00:00 650 mg via ORAL
  Filled 2019-03-07: qty 2

## 2019-03-07 NOTE — ED Provider Notes (Signed)
[facility] DEPT Provider Note   CSN: [id number] Arrival date & time: 03/07/19  1540     History   Chief Complaint Chief Complaint  Patient presents with  . Fall    HPI Tamara Arnold is a 85 y.o. female hx of HTN, HL, dementia, DM, here with fall.  Patient states that she was trying to pick up something and accidentally fell down and hit her head also noted to have a skin tear to the left lower extremity as well.  Patient denies passing out or headaches or vomiting.  Denies any chest pain or loss of consciousness.     The history is provided by the patient.    Past Medical History:  Diagnosis Date  . Anemia   . Anxiety   . Arthritis    "neck, back; none in my knees" (03/20/2013)  . Chronic lower back pain   . Daily headache    "all the time for 37 yrs" (03/20/2013)  . Depression   . GERD (gastroesophageal reflux disease)   . History of blood transfusion 02/2013   "related to this recent back OR" (03/20/2013)  . Hyperlipidemia   . Hypertension   . Macular degeneration of both eyes   . Migraine    "not migraines/neurologist" (09/12/2012)  . Shortness of breath    "lately cause my stomach is swollen" (03/20/2013)  . Skin cancer    "on my face alot; had one cut off left leg before; (03/20/2013)  . Type II diabetes mellitus ([place])    off all meds now    Patient Active Problem List   Diagnosis Date Noted  . Demand ischemia ([place])   . SVT (supraventricular tachycardia) ([place])   . Tachycardia   . Altered mental status 01/04/2016  . Catatonia 01/04/2016  . Delusional disorder ([place]) 07/22/2015  . Vomiting 09/12/2012  . Dehydration 09/12/2012  . Hyponatremia 09/12/2012  . Constipation 01/23/2012  . Anemia 01/23/2012  . Essential hypertension   . Migraine   . Reflux   . Acute renal failure ([place]) 12/01/2011  . Hypokalemia 11/29/2011  . Nonspecific abnormal findings on radiological and examination of skull and head 11/29/2011  .  Headache 11/28/2011  . Accelerated hypertension 11/28/2011  . Diabetes mellitus ([place]) 11/28/2011  . H/O major depression 11/28/2011  . Hyperlipidemia 11/28/2011    Past Surgical History:  Procedure Laterality Date  . ABDOMINAL HYSTERECTOMY    . APPENDECTOMY  1934  . BACK SURGERY  03/10/2013   [facility]-spinal stenosis  . CARPAL TUNNEL RELEASE Right 12/03/2013   Procedure: RIGHT CARPAL TUNNEL RELEASE;  Surgeon: Wynonia Sours, MD;  Location: [facility];  Service: Orthopedics;  Laterality: Right;  . CATARACT EXTRACTION W/ INTRAOCULAR LENS  IMPLANT, BILATERAL Bilateral   . CHOLECYSTECTOMY  1970's?  . CYSTECTOMY     "top of my head; removed @ [facility]; it was benign" (09/12/2012)  . HERNIA REPAIR    . LUMBAR PUNCTURE  01/05/2016   Archie Endo 01/05/2016  . SKIN CANCER EXCISION     [facility]  . TRIGGER FINGER RELEASE Right 12/03/2013   Procedure: RELEASE TRIGGER FINGER/A-1 PULLEY SMALL FINGER;  Surgeon: Wynonia Sours, MD;  Location: [facility];  Service: Orthopedics;  Laterality: Right;  . UMBILICAL HERNIA REPAIR       OB History   No obstetric history on file.      Home Medications    Prior to Admission medications   Medication Sig Start Date End Date Taking?  Authorizing Provider  acetaminophen (TYLENOL) 325 MG tablet Take 650 mg by mouth 3 (three) times daily as needed for mild pain or headache.     [provider]  amoxicillin-clavulanate (AUGMENTIN) 500-125 MG tablet Take 1 tablet (500 mg total) by mouth every 12 (twelve) hours. X 2 more days, then OFF 01/11/16   Rai, Ripudeep K, MD  ascorbic acid (VITAMIN C) 500 MG tablet Take 500 mg by mouth every evening.     [provider]  aspirin 325 MG tablet Take 1 tablet (325 mg total) by mouth daily. 01/11/16   Rai, Vernelle Emerald, MD  beta carotene w/minerals (OCUVITE) tablet Take 1 tablet by mouth daily.    [provider]  Calcium Carb-Cholecalciferol (CALCIUM 600+D) 600-800 MG-UNIT TABS Take 1  tablet by mouth every evening.    [provider]  diltiazem (CARDIZEM CD) 180 MG 24 hr capsule Take 1 capsule (180 mg total) by mouth daily. 01/11/16   Rai, Ripudeep Raliegh Ip, MD  fenofibrate 160 MG tablet Take 160 mg by mouth daily.    [provider]  ferrous sulfate 325 (65 FE) MG tablet Take 325 mg by mouth daily at 12 noon.    [provider]  fluticasone (FLONASE) 50 MCG/ACT nasal spray Place 2 sprays into the nose daily as needed for allergies (Seasonal).     [provider]  folic acid (FOLVITE) 1 MG tablet Take 1 mg by mouth 2 (two) times daily.      [provider]  hydrALAZINE (APRESOLINE) 50 MG tablet Take 50 mg by mouth 4 (four) times daily.     [provider]  hydrochlorothiazide (HYDRODIURIL) 25 MG tablet Take 25 mg by mouth every evening. 12/27/15   [provider]  lisinopril (PRINIVIL,ZESTRIL) 40 MG tablet Take 40 mg by mouth daily.    [provider]  metoprolol (LOPRESSOR) 100 MG tablet Take 1 tablet (100 mg total) by mouth 2 (two) times daily. 01/11/16   Mendel Corning, MD  Multiple Vitamins-Minerals (MULTIVITAMINS THER. W/MINERALS) TABS Take 1 tablet by mouth daily.      [provider]  omeprazole (PRILOSEC) 20 MG capsule Take 20 mg by mouth 2 (two) times daily before a meal.     [provider]  OXcarbazepine (TRILEPTAL) 150 MG tablet Take 1 tablet (150 mg total) by mouth 2 (two) times daily. 01/11/16   Rai, Ripudeep K, MD  polyethylene glycol (MIRALAX / GLYCOLAX) packet Take 34 g by mouth daily.     [provider]  QUEtiapine (SEROQUEL) 25 MG tablet Take 1 tablet (25 mg total) by mouth at bedtime. 01/11/16   Rai, Ripudeep K, MD  senna-docusate (SENOKOT-S) 8.6-50 MG tablet Take 1 tablet by mouth at bedtime as needed for mild constipation. 01/11/16   Rai, Vernelle Emerald, MD  traMADol (ULTRAM) 50 MG tablet Take 1 tablet (50 mg total) by mouth every 12 (twelve) hours as needed for moderate pain.  01/11/16   Rai, Vernelle Emerald, MD  vitamin E (VITAMIN E) 400 UNIT capsule Take 400 Units by mouth daily.      [provider]    Family History Family History  Problem Relation Age of Onset  . Diabetes type II Father     Social History Social History   Tobacco Use  . Smoking status: Never Smoker  . Smokeless tobacco: Never Used  Substance Use Topics  . Alcohol use: No  . Drug use: No     Allergies  Gabapentin, Prednisone, Topiramate, and Wellbutrin [bupropion hcl]   Review of Systems Review of Systems  Skin: Positive for wound.       Scalp hematoma   All other systems reviewed and are negative.    Physical Exam Updated Vital Signs BP (!) 171/66   Pulse 66   Temp 97.7 F (36.5 C) (Oral)   Resp (!) 22   SpO2 98%   Physical Exam Vitals signs and nursing note reviewed.  HENT:     Head: Normocephalic.     Comments: Frontal and R scalp hematoma, no laceration     Nose: Nose normal.     Mouth/Throat:     Mouth: Mucous membranes are moist.  Eyes:     Extraocular Movements: Extraocular movements intact.     Pupils: Pupils are equal, round, and reactive to light.  Neck:     Musculoskeletal: Normal range of motion.     Comments: No midline tenderness  Cardiovascular:     Rate and Rhythm: Normal rate and regular rhythm.     Pulses: Normal pulses.     Heart sounds: Normal heart sounds.  Pulmonary:     Effort: Pulmonary effort is normal.     Breath sounds: Normal breath sounds.  Abdominal:     General: Abdomen is flat.     Palpations: Abdomen is soft.  Musculoskeletal:     Comments: Skin tear R shin, no bony tenderness, nl ROM bilateral knees and hips. No spinal tenderness   Skin:    General: Skin is warm.     Capillary Refill: Capillary refill takes less than 2 seconds.  Neurological:     General: No focal deficit present.     Mental Status: She is alert and oriented to person, place, and time.     Cranial Nerves: No cranial nerve deficit.      Sensory: No sensory deficit.     Motor: No weakness.  Psychiatric:        Mood and Affect: Mood normal.      ED Treatments / Results  Labs (all labs ordered are listed, but only abnormal results are displayed) Labs Reviewed - No data to display  EKG None  Radiology No results found.  Procedures Procedures (including critical care time)  Medications Ordered in ED Medications  Tdap (BOOSTRIX) injection 0.5 mL (0.5 mLs Intramuscular Given 03/07/19 1606)  acetaminophen (TYLENOL) tablet 650 mg (650 mg Oral Given 03/07/19 1606)     Initial Impression / Assessment and Plan / ED Course  I have reviewed the triage vital signs and the nursing notes.  Pertinent labs & imaging results that were available during my care of the patient were reviewed by me and considered in my medical decision making (see chart for details).       Tamara Arnold is a 85 y.o. female here with fall. Mechanical fall with scalp hematoma. She is not on blood thinners and has normal neuro exam. Will get CT head/neck. She has R shin laceration with no bony tenderness and is bearing weight on the leg so will hold off on xrays.   7:05 PM CTs unremarkable. Stable for discharge back to facility.    Final Clinical Impressions(s) / ED Diagnoses   Final diagnoses:  None    ED Discharge Orders    None       Drenda Freeze, MD 03/07/19 [facility]

## 2019-03-07 NOTE — ED Notes (Signed)
[facility] called  

## 2019-03-07 NOTE — ED Triage Notes (Signed)
Patient is from [facility] where she had a fall today at 1100. She did hit her head and suffered skin tear to her leg. BP according to the facility was systolic 600.   EMS vitals: 176/72 BP 212 CBG 68  HR 98% O2 sat on room air

## 2019-03-07 NOTE — Discharge Instructions (Addendum)
Take tylenol or motrin for headaches.   Your CT head is unremarkable today.   See your doctor  Return to ER if you have worse headache, vomiting.

## 2019-03-08 DIAGNOSIS — I471 Supraventricular tachycardia: Secondary | ICD-10-CM | POA: Diagnosis not present

## 2019-03-08 DIAGNOSIS — D649 Anemia, unspecified: Secondary | ICD-10-CM | POA: Diagnosis not present

## 2019-03-10 DIAGNOSIS — N390 Urinary tract infection, site not specified: Secondary | ICD-10-CM | POA: Diagnosis not present

## 2019-03-10 DIAGNOSIS — N184 Chronic kidney disease, stage 4 (severe): Secondary | ICD-10-CM | POA: Diagnosis not present

## 2019-03-10 DIAGNOSIS — R609 Edema, unspecified: Secondary | ICD-10-CM | POA: Diagnosis not present

## 2019-03-10 DIAGNOSIS — K5900 Constipation, unspecified: Secondary | ICD-10-CM | POA: Diagnosis not present

## 2019-03-10 DIAGNOSIS — G44209 Tension-type headache, unspecified, not intractable: Secondary | ICD-10-CM | POA: Diagnosis not present

## 2019-03-10 DIAGNOSIS — R319 Hematuria, unspecified: Secondary | ICD-10-CM | POA: Diagnosis not present

## 2019-03-10 DIAGNOSIS — F0390 Unspecified dementia without behavioral disturbance: Secondary | ICD-10-CM | POA: Diagnosis not present

## 2019-03-12 DIAGNOSIS — U071 COVID-19: Secondary | ICD-10-CM | POA: Diagnosis not present

## 2019-03-13 DIAGNOSIS — N184 Chronic kidney disease, stage 4 (severe): Secondary | ICD-10-CM | POA: Diagnosis not present

## 2019-03-13 DIAGNOSIS — D649 Anemia, unspecified: Secondary | ICD-10-CM | POA: Diagnosis not present

## 2019-03-25 DIAGNOSIS — U071 COVID-19: Secondary | ICD-10-CM | POA: Diagnosis not present

## 2019-03-27 DIAGNOSIS — F0390 Unspecified dementia without behavioral disturbance: Secondary | ICD-10-CM | POA: Diagnosis not present

## 2019-03-27 DIAGNOSIS — I10 Essential (primary) hypertension: Secondary | ICD-10-CM | POA: Diagnosis not present

## 2019-03-27 DIAGNOSIS — D649 Anemia, unspecified: Secondary | ICD-10-CM | POA: Diagnosis not present

## 2019-03-27 DIAGNOSIS — R609 Edema, unspecified: Secondary | ICD-10-CM | POA: Diagnosis not present

## 2019-03-27 DIAGNOSIS — G44209 Tension-type headache, unspecified, not intractable: Secondary | ICD-10-CM | POA: Diagnosis not present

## 2019-03-27 DIAGNOSIS — K5900 Constipation, unspecified: Secondary | ICD-10-CM | POA: Diagnosis not present

## 2019-03-28 DIAGNOSIS — U071 COVID-19: Secondary | ICD-10-CM | POA: Diagnosis not present

## 2019-04-02 DIAGNOSIS — U071 COVID-19: Secondary | ICD-10-CM | POA: Diagnosis not present

## 2019-04-09 DIAGNOSIS — U071 COVID-19: Secondary | ICD-10-CM | POA: Diagnosis not present

## 2019-04-09 DIAGNOSIS — E119 Type 2 diabetes mellitus without complications: Secondary | ICD-10-CM | POA: Diagnosis not present

## 2019-04-09 DIAGNOSIS — F0390 Unspecified dementia without behavioral disturbance: Secondary | ICD-10-CM | POA: Diagnosis not present

## 2019-04-09 DIAGNOSIS — G44209 Tension-type headache, unspecified, not intractable: Secondary | ICD-10-CM | POA: Diagnosis not present

## 2019-04-09 DIAGNOSIS — K5900 Constipation, unspecified: Secondary | ICD-10-CM | POA: Diagnosis not present

## 2019-04-14 DIAGNOSIS — U071 COVID-19: Secondary | ICD-10-CM | POA: Diagnosis not present

## 2019-04-22 DIAGNOSIS — U071 COVID-19: Secondary | ICD-10-CM | POA: Diagnosis not present

## 2019-04-24 DIAGNOSIS — F0391 Unspecified dementia with behavioral disturbance: Secondary | ICD-10-CM | POA: Diagnosis not present

## 2019-04-24 DIAGNOSIS — K5900 Constipation, unspecified: Secondary | ICD-10-CM | POA: Diagnosis not present

## 2019-04-24 DIAGNOSIS — F0390 Unspecified dementia without behavioral disturbance: Secondary | ICD-10-CM | POA: Diagnosis not present

## 2019-04-24 DIAGNOSIS — G44209 Tension-type headache, unspecified, not intractable: Secondary | ICD-10-CM | POA: Diagnosis not present

## 2019-04-29 DIAGNOSIS — U071 COVID-19: Secondary | ICD-10-CM | POA: Diagnosis not present

## 2019-05-06 DIAGNOSIS — U071 COVID-19: Secondary | ICD-10-CM | POA: Diagnosis not present

## 2019-05-07 DIAGNOSIS — K5900 Constipation, unspecified: Secondary | ICD-10-CM | POA: Diagnosis not present

## 2019-05-07 DIAGNOSIS — R609 Edema, unspecified: Secondary | ICD-10-CM | POA: Diagnosis not present

## 2019-05-07 DIAGNOSIS — F0390 Unspecified dementia without behavioral disturbance: Secondary | ICD-10-CM | POA: Diagnosis not present

## 2019-05-07 DIAGNOSIS — G44209 Tension-type headache, unspecified, not intractable: Secondary | ICD-10-CM | POA: Diagnosis not present

## 2019-05-13 DIAGNOSIS — U071 COVID-19: Secondary | ICD-10-CM | POA: Diagnosis not present

## 2019-05-20 DIAGNOSIS — R278 Other lack of coordination: Secondary | ICD-10-CM | POA: Diagnosis not present

## 2019-05-20 DIAGNOSIS — U071 COVID-19: Secondary | ICD-10-CM | POA: Diagnosis not present

## 2019-05-22 DIAGNOSIS — G44209 Tension-type headache, unspecified, not intractable: Secondary | ICD-10-CM | POA: Diagnosis not present

## 2019-05-22 DIAGNOSIS — F0390 Unspecified dementia without behavioral disturbance: Secondary | ICD-10-CM | POA: Diagnosis not present

## 2019-05-22 DIAGNOSIS — F0391 Unspecified dementia with behavioral disturbance: Secondary | ICD-10-CM | POA: Diagnosis not present

## 2019-05-22 DIAGNOSIS — K5900 Constipation, unspecified: Secondary | ICD-10-CM | POA: Diagnosis not present

## 2019-05-26 DIAGNOSIS — R278 Other lack of coordination: Secondary | ICD-10-CM | POA: Diagnosis not present

## 2019-05-26 DIAGNOSIS — U071 COVID-19: Secondary | ICD-10-CM | POA: Diagnosis not present

## 2019-05-30 DIAGNOSIS — U071 COVID-19: Secondary | ICD-10-CM | POA: Diagnosis not present

## 2019-06-23 DEATH — deceased
